# Patient Record
Sex: Female | Born: 1955 | Race: White | Hispanic: No | Marital: Single | Smoking: Former smoker
Health system: Southern US, Community
[De-identification: ages and names within clinical notes are randomized; demographics above are authoritative.]

## PROBLEM LIST (undated history)

## (undated) DIAGNOSIS — I499 Cardiac arrhythmia, unspecified: Secondary | ICD-10-CM

## (undated) DIAGNOSIS — I219 Acute myocardial infarction, unspecified: Secondary | ICD-10-CM

## (undated) DIAGNOSIS — I10 Essential (primary) hypertension: Secondary | ICD-10-CM

## (undated) DIAGNOSIS — J45909 Unspecified asthma, uncomplicated: Secondary | ICD-10-CM

## (undated) DIAGNOSIS — M1990 Unspecified osteoarthritis, unspecified site: Secondary | ICD-10-CM

## (undated) DIAGNOSIS — L0390 Cellulitis, unspecified: Secondary | ICD-10-CM

## (undated) DIAGNOSIS — F419 Anxiety disorder, unspecified: Secondary | ICD-10-CM

## (undated) DIAGNOSIS — I4891 Unspecified atrial fibrillation: Secondary | ICD-10-CM

## (undated) DIAGNOSIS — E039 Hypothyroidism, unspecified: Secondary | ICD-10-CM

## (undated) HISTORY — PX: UMBILICAL HERNIA REPAIR: SHX196

## (undated) HISTORY — DX: Hypothyroidism, unspecified: E03.9

## (undated) HISTORY — PX: CHOLECYSTECTOMY: SHX55

## (undated) HISTORY — DX: Unspecified atrial fibrillation: I48.91

## (undated) HISTORY — DX: Unspecified asthma, uncomplicated: J45.909

---

## 2017-04-22 DIAGNOSIS — I160 Hypertensive urgency: Secondary | ICD-10-CM

## 2017-04-22 DIAGNOSIS — L03116 Cellulitis of left lower limb: Secondary | ICD-10-CM

## 2017-04-22 DIAGNOSIS — I4891 Unspecified atrial fibrillation: Secondary | ICD-10-CM

## 2017-04-22 DIAGNOSIS — E119 Type 2 diabetes mellitus without complications: Secondary | ICD-10-CM

## 2017-04-23 DIAGNOSIS — I4891 Unspecified atrial fibrillation: Secondary | ICD-10-CM

## 2017-11-21 DIAGNOSIS — L03116 Cellulitis of left lower limb: Secondary | ICD-10-CM

## 2017-12-21 ENCOUNTER — Other Ambulatory Visit: Payer: Self-pay

## 2017-12-21 DIAGNOSIS — E039 Hypothyroidism, unspecified: Secondary | ICD-10-CM | POA: Insufficient documentation

## 2017-12-27 ENCOUNTER — Ambulatory Visit (INDEPENDENT_AMBULATORY_CARE_PROVIDER_SITE_OTHER): Payer: Medicaid Other | Admitting: Cardiology

## 2017-12-27 ENCOUNTER — Encounter: Payer: Self-pay | Admitting: Cardiology

## 2017-12-27 VITALS — BP 132/80 | HR 125 | Ht 66.0 in | Wt 315.0 lb

## 2017-12-27 DIAGNOSIS — I4892 Unspecified atrial flutter: Secondary | ICD-10-CM | POA: Insufficient documentation

## 2017-12-27 DIAGNOSIS — E039 Hypothyroidism, unspecified: Secondary | ICD-10-CM | POA: Diagnosis not present

## 2017-12-27 MED ORDER — METOPROLOL SUCCINATE ER 25 MG PO TB24: 25.0000 mg | ORAL_TABLET | Freq: Every day | ORAL | 2 refills | Status: DC

## 2017-12-27 MED ORDER — METOPROLOL SUCCINATE ER 25 MG PO TB24: 25.0000 mg | ORAL_TABLET | Freq: Two times a day (BID) | ORAL | 2 refills | Status: DC

## 2017-12-27 MED ORDER — DIGOXIN 125 MCG PO TABS: 0.1250 mg | ORAL_TABLET | Freq: Every day | ORAL | 0 refills | Status: DC

## 2017-12-27 NOTE — Progress Notes (Deleted)
Cardiology Office Note:    Date:  12/27/2017   ID:  Carrie Walters, DOB 03-05-1956, MRN [id number]  PCP:  [name], MD  Cardiologist:  Garwin Brothers, MD   Referring MD: [name], MD    ASSESSMENT:    1. Atrial fibrillation, unspecified type (HCC)   2. Hypothyroidism, unspecified type   3. Atrial flutter, unspecified type (HCC)    PLAN:    In order of problems listed above:  1. ***   Medication Adjustments/Labs and Tests Ordered: Current medicines are reviewed at length with the patient today.  Concerns regarding medicines are outlined above.  Orders Placed This Encounter  Procedures  . TSH  . CBC with Differential/Platelet  . Basic metabolic panel  . Hepatic function panel  . EKG 12-Lead   Meds ordered this encounter  Medications  . DISCONTD: metoprolol succinate (TOPROL-XL) 25 MG 24 hr tablet    Sig: Take 1 tablet (25 mg total) by mouth daily.    Dispense:  90 tablet    Refill:  2  . DISCONTD: metoprolol succinate (TOPROL-XL) 25 MG 24 hr tablet    Sig: Take 1 tablet (25 mg total) by mouth 2 (two) times daily.    Dispense:  180 tablet    Refill:  2  . digoxin (LANOXIN) 0.125 MG tablet    Sig: Take 1 tablet (0.125 mg total) by mouth daily.    Dispense:  30 tablet    Refill:  0     History of Present Illness:    Carrie Walters is a 62 y.o. female who is being seen today for the evaluation of *** at the request of [name], MD. ***  Past Medical History:  Diagnosis Date  . Asthma   . Atrial fibrillation (HCC)   . Hypothyroidism     Past Surgical History:  Procedure Laterality Date  . CHOLECYSTECTOMY    . UMBILICAL HERNIA REPAIR      Current Medications: Current Meds  Medication Sig  . albuterol (PROVENTIL HFA;VENTOLIN HFA) 108 (90 Base) MCG/ACT inhaler Inhale 2 puffs into the lungs every 6 (six) hours as needed for wheezing or shortness of breath.  . ciprofloxacin (CIPRO) 500 MG tablet Take 1 tablet by mouth 2 (two) times daily.  .  furosemide (LASIX) 40 MG tablet Take 40 mg by mouth daily.  . metolazone (ZAROXOLYN) 2.5 MG tablet Take 2.5 mg by mouth daily.  . verapamil (CALAN-SR) 180 MG CR tablet Take 180 mg by mouth 2 (two) times daily.     Allergies:   Patient has no known allergies.   Social History   Socioeconomic History  . Marital status: Single    Spouse name: Not on file  . Number of children: Not on file  . Years of education: Not on file  . Highest education level: Not on file  Occupational History  . Not on file  Social Needs  . Financial resource strain: Not on file  . Food insecurity:    Worry: Not on file    Inability: Not on file  . Transportation needs:    Medical: Not on file    Non-medical: Not on file  Tobacco Use  . Smoking status: Former Games developer  . Smokeless tobacco: Never Used  Substance and Sexual Activity  . Alcohol use: Not on file  . Drug use: Not on file  . Sexual activity: Not on file  Lifestyle  . Physical activity:    Days per week: Not on file  Minutes per session: Not on file  . Stress: Not on file  Relationships  . Social connections:    Talks on phone: Not on file    Gets together: Not on file    Attends religious service: Not on file    Active member of club or organization: Not on file    Attends meetings of clubs or organizations: Not on file    Relationship status: Not on file  Other Topics Concern  . Not on file  Social History Narrative   Moved from [place]     Family History: The patient's family history includes Atrial fibrillation in her brother; Heart disease in her father.  ROS:   Please see the history of present illness.    All other systems reviewed and are negative.  EKGs/Labs/Other Studies Reviewed:    The following studies were reviewed today: ***   Recent Labs: No results found for requested labs within last 8760 hours.  Recent Lipid Panel No results found for: CHOL, TRIG, HDL, CHOLHDL, VLDL, LDLCALC, LDLDIRECT  Physical  Exam:    VS:  BP 132/80 (BP Location: Right Arm, Patient Position: Sitting, Cuff Size: Normal)   Pulse (!) 125   Ht 5\' 6"  (1.676 m)   Wt (!) 315 lb (142.9 kg)   SpO2 95%   BMI 50.84 kg/m     Wt Readings from Last 3 Encounters:  12/27/17 (!) 315 lb (142.9 kg)  12/21/17 (!) 312 lb (141.5 kg)     GEN: Patient is in no acute distress HEENT: Normal NECK: No JVD; No carotid bruits LYMPHATICS: No lymphadenopathy CARDIAC: S1 S2 regular, 2/6 systolic murmur at the apex. RESPIRATORY:  Clear to auscultation without rales, wheezing or rhonchi  ABDOMEN: Soft, non-tender, non-distended MUSCULOSKELETAL:  No edema; No deformity  SKIN: Warm and dry NEUROLOGIC:  Alert and oriented x 3 PSYCHIATRIC:  Normal affect    Signed, Garwin Brothers, MD  12/27/2017 3:53 PM    [facility]

## 2017-12-27 NOTE — Patient Instructions (Addendum)
Medication Instructions:  Your physician has recommended you make the following change in your medication:  START digoxin 0.125 mg daily  Labwork: Your physician recommends that you have the following labs drawn: TSH, BMP, CBC and liver function panel.  Testing/Procedures: None  Follow-Up: Your physician recommends that you schedule a follow-up appointment in: 1 month  1 week with nurse for EKG, pulse and blood pressure.  Any Other Special Instructions Will Be Listed Below (If Applicable).     If you need a refill on your cardiac medications before your next appointment, please call your pharmacy.   [facility]  Garey Ham, RN, BSN

## 2017-12-27 NOTE — Progress Notes (Signed)
Cardiology Office Note:    Date:  12/27/2017   ID:  Carrie Walters, DOB [birth year], MRN [id number]  PCP:  [name], MD  Cardiologist:  Garwin Brothers, MD   Referring MD: [name], MD    ASSESSMENT:    1. Atrial flutter, unspecified type (HCC)   2. Hypothyroidism, unspecified type   3. Morbid obesity (HCC)    PLAN:    In order of problems listed above:  1. I discussed my findings with the patient at extensive length.  I am going to get blood work done on her today she tells me that she has not had blood work done in the past 1 year.  I reviewed [place] hospital records and her TSH was 10 her hemoglobin was fine she had microcytic anemia.  I also noted that her Chem-7 was unremarkable.  I will initiate her on digoxin 125 mcg daily.  I will do blood work which is Chem-7, CBC, TSH and liver function test.  I will adjust her digoxin dose accordingly.  She will be back on Monday for a pulse blood pressure and an EKG.  Also upon review of her left work I would initiate her on anticoagulation because of elevated chads VASC risk score.  We will discuss these issues with her and her follow-ups.  I discussed anticoagulation and she vocalized understanding.  She is here to make up her mind on this. 2. Diet was discussed for obesity and this of obesity explained she vocalized understanding.  At this time she appears to be non-compliant with medical advice.  She has significant wounds on her lower extremities from obesity and cellulitis-like issues this is followed by her primary care physician. 3. 1 month follow-up or earlier if she has any concerns.   Medication Adjustments/Labs and Tests Ordered: Current medicines are reviewed at length with the patient today.  Concerns regarding medicines are outlined above.  Orders Placed This Encounter  Procedures  . TSH  . CBC with Differential/Platelet  . Basic metabolic panel  . Hepatic function panel  . EKG 12-Lead   Meds ordered this  encounter  Medications  . DISCONTD: metoprolol succinate (TOPROL-XL) 25 MG 24 hr tablet    Sig: Take 1 tablet (25 mg total) by mouth daily.    Dispense:  90 tablet    Refill:  2  . DISCONTD: metoprolol succinate (TOPROL-XL) 25 MG 24 hr tablet    Sig: Take 1 tablet (25 mg total) by mouth 2 (two) times daily.    Dispense:  180 tablet    Refill:  2  . digoxin (LANOXIN) 0.125 MG tablet    Sig: Take 1 tablet (0.125 mg total) by mouth daily.    Dispense:  30 tablet    Refill:  0     History of Present Illness:    Carrie Walters is a 62 y.o. female who is being seen today for the evaluation of atrial flutter with rapid ventricular rate at the request of [name], MD.  Patient is a 62 year old female.  This is the first time I am seeing her.  She has history of arrhythmia.  She tells me that she was in [place] and she has moved down here and plans to go back again.  She denies any problems.  I saw her with a heart rate of about 125" looks like atrial flutter.  She is asymptomatic according to the patient.  She tells me that she has had this problem many times in  the past.  I mentioned to her that with his heart rate I would prefer her to go to the emergency room but she is vehemently against it.  Again she claims to be asymptomatic.  At the time of my evaluation, the patient is alert awake oriented and in no distress.  She has a history of essential hypertension.  Past Medical History:  Diagnosis Date  . Asthma   . Atrial fibrillation (HCC)   . Hypothyroidism     Past Surgical History:  Procedure Laterality Date  . CHOLECYSTECTOMY    . UMBILICAL HERNIA REPAIR      Current Medications: Current Meds  Medication Sig  . albuterol (PROVENTIL HFA;VENTOLIN HFA) 108 (90 Base) MCG/ACT inhaler Inhale 2 puffs into the lungs every 6 (six) hours as needed for wheezing or shortness of breath.  . ciprofloxacin (CIPRO) 500 MG tablet Take 1 tablet by mouth 2 (two) times daily.  . furosemide  (LASIX) 40 MG tablet Take 40 mg by mouth daily.  . metolazone (ZAROXOLYN) 2.5 MG tablet Take 2.5 mg by mouth daily.  . verapamil (CALAN-SR) 180 MG CR tablet Take 180 mg by mouth 2 (two) times daily.     Allergies:   Patient has no known allergies.   Social History   Socioeconomic History  . Marital status: Single    Spouse name: Not on file  . Number of children: Not on file  . Years of education: Not on file  . Highest education level: Not on file  Occupational History  . Not on file  Social Needs  . Financial resource strain: Not on file  . Food insecurity:    Worry: Not on file    Inability: Not on file  . Transportation needs:    Medical: Not on file    Non-medical: Not on file  Tobacco Use  . Smoking status: Former Games developer  . Smokeless tobacco: Never Used  Substance and Sexual Activity  . Alcohol use: Not on file  . Drug use: Not on file  . Sexual activity: Not on file  Lifestyle  . Physical activity:    Days per week: Not on file    Minutes per session: Not on file  . Stress: Not on file  Relationships  . Social connections:    Talks on phone: Not on file    Gets together: Not on file    Attends religious service: Not on file    Active member of club or organization: Not on file    Attends meetings of clubs or organizations: Not on file    Relationship status: Not on file  Other Topics Concern  . Not on file  Social History Narrative   Moved from [place]     Family History: The patient's family history includes Atrial fibrillation in her brother; Heart disease in her father.  ROS:   Please see the history of present illness.    All other systems reviewed and are negative.  EKGs/Labs/Other Studies Reviewed:    The following studies were reviewed today: Atrial flutter with rapid ventricular rate on resting EKG.   Recent Labs: No results found for requested labs within last 8760 hours.  Recent Lipid Panel No results found for: CHOL, TRIG, HDL,  CHOLHDL, VLDL, LDLCALC, LDLDIRECT  Physical Exam:    VS:  BP 132/80 (BP Location: Right Arm, Patient Position: Sitting, Cuff Size: Normal)   Pulse (!) 125   Ht 5\' 6"  (1.676 m)   Wt (!) 315 lb (  142.9 kg)   SpO2 95%   BMI 50.84 kg/m     Wt Readings from Last 3 Encounters:  12/27/17 (!) 315 lb (142.9 kg)  12/21/17 (!) 312 lb (141.5 kg)     GEN: Patient is in no acute distress HEENT: Normal NECK: No JVD; No carotid bruits LYMPHATICS: No lymphadenopathy CARDIAC: S1 S2 irregular, 2/6 systolic murmur at the apex. RESPIRATORY:  Clear to auscultation without rales, wheezing or rhonchi  ABDOMEN: Soft, non-tender, non-distended MUSCULOSKELETAL:  No edema; No deformity  SKIN: Warm and dry NEUROLOGIC:  Alert and oriented x 3 PSYCHIATRIC:  Normal affect    Signed, Garwin Brothers, MD  12/27/2017 3:58 PM    [facility]

## 2017-12-28 ENCOUNTER — Other Ambulatory Visit: Payer: Self-pay

## 2017-12-28 ENCOUNTER — Telehealth: Payer: Self-pay

## 2017-12-28 ENCOUNTER — Telehealth: Payer: Self-pay | Admitting: Cardiology

## 2017-12-28 LAB — BASIC METABOLIC PANEL
BUN / CREAT RATIO: 9 — AB (ref 12–28)
BUN: 10 mg/dL (ref 8–27)
CHLORIDE: 102 mmol/L (ref 96–106)
CO2: 26 mmol/L (ref 20–29)
Calcium: 9 mg/dL (ref 8.7–10.3)
Creatinine, Ser: 1.08 mg/dL — ABNORMAL HIGH (ref 0.57–1.00)
GFR calc non Af Amer: 55 mL/min/{1.73_m2} — ABNORMAL LOW (ref >59)
GFR, EST AFRICAN AMERICAN: 64 mL/min/{1.73_m2} (ref >59)
GLUCOSE: 100 mg/dL — AB (ref 65–99)
Potassium: 4.4 mmol/L (ref 3.5–5.2)
Sodium: 142 mmol/L (ref 134–144)

## 2017-12-28 LAB — CBC WITH DIFFERENTIAL/PLATELET
BASOS ABS: 0 10*3/uL (ref 0.0–0.2)
Basos: 0 %
EOS (ABSOLUTE): 0.2 10*3/uL (ref 0.0–0.4)
Eos: 2 %
Hematocrit: 37.9 % (ref 34.0–46.6)
Hemoglobin: 12.2 g/dL (ref 11.1–15.9)
Immature Grans (Abs): 0 10*3/uL (ref 0.0–0.1)
Immature Granulocytes: 0 %
LYMPHS ABS: 1.8 10*3/uL (ref 0.7–3.1)
Lymphs: 22 %
MCH: 25.8 pg — AB (ref 26.6–33.0)
MCHC: 32.2 g/dL (ref 31.5–35.7)
MCV: 80 fL (ref 79–97)
MONOS ABS: 0.6 10*3/uL (ref 0.1–0.9)
Monocytes: 7 %
NEUTROS ABS: 5.5 10*3/uL (ref 1.4–7.0)
Neutrophils: 69 %
Platelets: 236 10*3/uL (ref 150–450)
RBC: 4.72 x10E6/uL (ref 3.77–5.28)
RDW: 21.3 % — AB (ref 12.3–15.4)
WBC: 8.1 10*3/uL (ref 3.4–10.8)

## 2017-12-28 LAB — HEPATIC FUNCTION PANEL
ALT: 6 IU/L (ref 0–32)
AST: 9 IU/L (ref 0–40)
Albumin: 3.7 g/dL (ref 3.6–4.8)
Alkaline Phosphatase: 96 IU/L (ref 39–117)
BILIRUBIN, DIRECT: 0.54 mg/dL — AB (ref 0.00–0.40)
Bilirubin Total: 1.7 mg/dL — ABNORMAL HIGH (ref 0.0–1.2)
TOTAL PROTEIN: 6.5 g/dL (ref 6.0–8.5)

## 2017-12-28 LAB — TSH: TSH: 7.52 u[IU]/mL — AB (ref 0.450–4.500)

## 2017-12-28 MED ORDER — DIGOXIN 125 MCG PO TABS: 0.1250 mg | ORAL_TABLET | Freq: Every day | ORAL | 0 refills | Status: DC

## 2017-12-28 MED ORDER — ASPIRIN EC 325 MG PO TBEC: 325.0000 mg | DELAYED_RELEASE_TABLET | Freq: Every day | ORAL | 0 refills | Status: DC

## 2017-12-28 NOTE — Telephone Encounter (Signed)
The Digoxin that was called in yesterday was supposed to be called to [facility] in [place], not [facility]

## 2017-12-28 NOTE — Telephone Encounter (Signed)
Med refill was sent to pharmacy in [place].

## 2017-12-28 NOTE — Telephone Encounter (Signed)
Unable to leave voicemail regarding labs due to busy tone, will try again later.

## 2018-01-03 ENCOUNTER — Ambulatory Visit: Payer: Medicaid Other

## 2018-01-07 ENCOUNTER — Ambulatory Visit: Payer: Medicaid Other

## 2018-01-10 ENCOUNTER — Ambulatory Visit: Payer: Medicaid Other

## 2018-01-14 ENCOUNTER — Ambulatory Visit: Payer: Medicaid Other

## 2018-01-14 ENCOUNTER — Ambulatory Visit (INDEPENDENT_AMBULATORY_CARE_PROVIDER_SITE_OTHER): Payer: Medicaid Other | Admitting: Cardiology

## 2018-01-14 VITALS — BP 140/92 | HR 127 | Ht 66.0 in | Wt 293.0 lb

## 2018-01-14 DIAGNOSIS — I4892 Unspecified atrial flutter: Secondary | ICD-10-CM | POA: Diagnosis not present

## 2018-01-14 MED ORDER — METOPROLOL SUCCINATE ER 25 MG PO TB24: 25.0000 mg | ORAL_TABLET | Freq: Every day | ORAL | 1 refills | Status: DC

## 2018-01-14 MED ORDER — NEBIVOLOL HCL 5 MG PO TABS: 5.0000 mg | ORAL_TABLET | Freq: Every day | ORAL | 2 refills | Status: DC

## 2018-01-14 NOTE — Patient Instructions (Addendum)
Medication Instructions:  Your physician has recommended you make the following change in your medication: Bystolic 5 mg daily  Labwork: None  Testing/Procedures: None  Follow-Up: Your physician recommends that you schedule a follow-up appointment in: two days   Any Other Special Instructions Will Be Listed Below (If Applicable).     If you need a refill on your cardiac medications before your next appointment, please call your pharmacy. Nebivolol oral tablets What is this medicine? NEBIVOLOL (ne BIV oh lol) is a beta-blocker. Beta-blockers reduce the workload on the heart and help it to beat more regularly. This medicine is used to treat high blood pressure. This medicine may be used for other purposes; ask your health care provider or pharmacist if you have questions. COMMON BRAND NAME(S): Bystolic What should I tell my health care provider before I take this medicine? They need to know if you have any of these conditions: -diabetes -heart or vessel disease like slow heartrate, worsening heart failure, heart block, sick sinus syndrome or Raynaud's disease -kidney disease -liver disease -lung disease like asthma or emphysema -pheochromocytoma -thyroid disease -an unusual or allergic reaction to nebivolol, other beta-blockers, medicines, foods, dyes, or preservatives -pregnant or trying to get pregnant -breast-feeding How should I use this medicine? Take this medicine by mouth with a glass of water. Follow the directions on the prescription label. You can take this medicine with or without food. Take your doses at regular intervals. Do not take your medicine more often than directed. Do not stop taking this medicine suddenly. This could lead to serious heart-related effects. Talk to your pediatrician regarding the use of this medicine in children. Special care may be needed. Overdosage: If you think you have taken too much of this medicine contact a poison control center or  emergency room at once. NOTE: This medicine is only for you. Do not share this medicine with others. What if I miss a dose? If you miss a dose, take it as soon as you can. If it is almost time for your next dose, take only that dose. Do not take double or extra doses. What may interact with this medicine? This medicine may interact with the following medications: -certain medicines for blood pressure, heart disease, irregular heart beat -certain medicines for depression, like fluoxetine or paroxetine -cimetidine -clonidine -reserpine -sildenafil This list may not describe all possible interactions. Give your health care provider a list of all the medicines, herbs, non-prescription drugs, or dietary supplements you use. Also tell them if you smoke, drink alcohol, or use illegal drugs. Some items may interact with your medicine. What should I watch for while using this medicine? Visit your doctor or health care professional for regular checks on your progress. Check your heart rate and blood pressure regularly while you are taking this medicine. Ask your doctor or health care professional what your heart rate and blood pressure should be, and when you should contact him or her. You may get drowsy or dizzy. Do not drive, use machinery, or do anything that needs mental alertness until you know how this drug affects you. Do not stand or sit up quickly, especially if you are an older patient. This reduces the risk of dizzy or fainting spells. Alcohol can make you more drowsy and dizzy. Avoid alcoholic drinks. This medicine can affect blood sugar levels. If you have diabetes, check with your doctor or health care professional before you change your diet or the dose of your diabetic medicine. Do not treat yourself  for coughs, colds, or pain while you are taking this medicine without asking your doctor or health care professional for advice. Some ingredients may increase your blood pressure. What side  effects may I notice from receiving this medicine? Side effects that you should report to your doctor or health care professional as soon as possible: -allergic reactions like skin rash, itching or hives, swelling of the face, lips, or tongue -breathing problems -chest pain -cold, tingling, or numb hands or feet -dark urine -general ill feeling or flu-like symptoms -irregular heartbeat -light-colored stools -loss of appetite, nausea -right upper belly pain -slow heart rate -swollen legs or ankles -unusual bleeding or bruising -unusually weak or tired -vomiting -yellowing of the eyes or skin Side effects that usually do not require medical attention (report to your doctor or health care professional if they continue or are bothersome): -diarrhea -dizziness -dry or burning eyes -headache -nausea -tiredness -trouble sleeping This list may not describe all possible side effects. Call your doctor for medical advice about side effects. You may report side effects to FDA at 1-800-FDA-1088. Where should I keep my medicine? Keep out of the reach of children. Store at room temperature between 20 and 25 degrees C (68 and 77 degrees F). Protect from light. Keep container tightly closed. Throw away any unused medicine after the expiration date. NOTE: This sheet is a summary. It may not cover all possible information. If you have questions about this medicine, talk to your doctor, pharmacist, or health care provider.  2018 Elsevier/Gold Standard (2013-01-10 14:46:00)

## 2018-01-15 ENCOUNTER — Encounter: Payer: Self-pay | Admitting: Cardiology

## 2018-01-15 NOTE — Progress Notes (Signed)
Patient presented today for EKG, pulse and BP check due to digoxin therapy. Per Dr. Tomie China wanted to start on metoprolol but patient states took before and couldn't tolerate, so per Dr. Tomie China would like to start patient on bystolic 5 mg and would like to see her back Wednesday for another nurse visit.

## 2018-01-16 ENCOUNTER — Ambulatory Visit: Payer: Medicaid Other

## 2018-01-18 ENCOUNTER — Ambulatory Visit (INDEPENDENT_AMBULATORY_CARE_PROVIDER_SITE_OTHER): Payer: Medicaid Other | Admitting: Cardiology

## 2018-01-18 VITALS — BP 145/82 | HR 122

## 2018-01-18 DIAGNOSIS — I4892 Unspecified atrial flutter: Secondary | ICD-10-CM | POA: Diagnosis not present

## 2018-01-18 MED ORDER — NEBIVOLOL HCL 10 MG PO TABS: 10.0000 mg | ORAL_TABLET | Freq: Every day | ORAL | 2 refills | Status: DC

## 2018-01-18 NOTE — Progress Notes (Signed)
Patient presented today for EKG, Blood pressure and pulse check due to  bystolic therapy and through the visit we advised patient to go to the ER because of uncontrolled heart rate and BP and patient refused so per Dr. Tomie China would like to increase bystolic and see patient back for a nurse visit on Tuesday.

## 2018-01-18 NOTE — Patient Instructions (Signed)
Medication Instructions:  Your physician has recommended you make the following change in your medication:   CHANGE: Bystolic 5 mg to Bystolic 10 mg daily.   Labwork: None  Testing/Procedures: None  Follow-Up:  Your physician recommends that you schedule a follow-up appointment in: Tuesday  .  Any Other Special Instructions Will Be Listed Below (If Applicable).     If you need a refill on your cardiac medications before your next appointment, please call your pharmacy.

## 2018-01-22 ENCOUNTER — Ambulatory Visit: Payer: Medicaid Other

## 2018-01-23 NOTE — Addendum Note (Signed)
Addended by: Lita Mains on: 01/23/2018 09:06 AM   Modules accepted: Orders

## 2018-01-23 NOTE — Addendum Note (Signed)
Addended by: Lita Mains on: 01/23/2018 02:12 PM   Modules accepted: Orders

## 2018-01-23 NOTE — Addendum Note (Signed)
Addended by: Lamona Curl on: 01/23/2018 03:23 PM   Modules accepted: Orders

## 2018-01-28 ENCOUNTER — Ambulatory Visit: Payer: Medicaid Other | Admitting: Cardiology

## 2018-02-06 ENCOUNTER — Ambulatory Visit: Payer: Medicaid Other | Admitting: Cardiology

## 2018-02-07 ENCOUNTER — Telehealth: Payer: Self-pay | Admitting: Cardiology

## 2018-02-07 NOTE — Telephone Encounter (Signed)
Please call about no insurance for biostolic

## 2018-02-08 NOTE — Telephone Encounter (Signed)
24 hour review process with  [facility], med has been sent to the pharmacist for review as it is not a preferred medication. Prior Berkley Harvey number is- [phone]. Phone interaction ID number is- [id number]

## 2018-02-14 ENCOUNTER — Ambulatory Visit: Payer: Medicaid Other | Admitting: Cardiology

## 2018-02-21 ENCOUNTER — Inpatient Hospital Stay (HOSPITAL_COMMUNITY)
Admission: AD | Admit: 2018-02-21 | Discharge: 2018-03-01 | DRG: 286 | Disposition: A | Discharge status: Home Health | Payer: Medicaid Other | Source: Other Acute Inpatient Hospital | Attending: Cardiology | Admitting: Cardiology

## 2018-02-21 ENCOUNTER — Encounter (HOSPITAL_COMMUNITY): Payer: Self-pay

## 2018-02-21 DIAGNOSIS — B958 Unspecified staphylococcus as the cause of diseases classified elsewhere: Secondary | ICD-10-CM | POA: Diagnosis not present

## 2018-02-21 DIAGNOSIS — J449 Chronic obstructive pulmonary disease, unspecified: Secondary | ICD-10-CM | POA: Diagnosis not present

## 2018-02-21 DIAGNOSIS — I472 Ventricular tachycardia: Secondary | ICD-10-CM | POA: Diagnosis present

## 2018-02-21 DIAGNOSIS — Z87891 Personal history of nicotine dependence: Secondary | ICD-10-CM

## 2018-02-21 DIAGNOSIS — L8990 Pressure ulcer of unspecified site, unspecified stage: Secondary | ICD-10-CM

## 2018-02-21 DIAGNOSIS — I428 Other cardiomyopathies: Secondary | ICD-10-CM | POA: Diagnosis not present

## 2018-02-21 DIAGNOSIS — I10 Essential (primary) hypertension: Secondary | ICD-10-CM | POA: Diagnosis not present

## 2018-02-21 DIAGNOSIS — J9601 Acute respiratory failure with hypoxia: Secondary | ICD-10-CM | POA: Diagnosis present

## 2018-02-21 DIAGNOSIS — E039 Hypothyroidism, unspecified: Secondary | ICD-10-CM | POA: Diagnosis not present

## 2018-02-21 DIAGNOSIS — Z8249 Family history of ischemic heart disease and other diseases of the circulatory system: Secondary | ICD-10-CM

## 2018-02-21 DIAGNOSIS — I5021 Acute systolic (congestive) heart failure: Secondary | ICD-10-CM | POA: Diagnosis present

## 2018-02-21 DIAGNOSIS — B852 Pediculosis, unspecified: Secondary | ICD-10-CM | POA: Diagnosis present

## 2018-02-21 DIAGNOSIS — I878 Other specified disorders of veins: Secondary | ICD-10-CM | POA: Diagnosis present

## 2018-02-21 DIAGNOSIS — I4891 Unspecified atrial fibrillation: Secondary | ICD-10-CM | POA: Diagnosis not present

## 2018-02-21 DIAGNOSIS — Z7982 Long term (current) use of aspirin: Secondary | ICD-10-CM | POA: Diagnosis not present

## 2018-02-21 DIAGNOSIS — I4811 Longstanding persistent atrial fibrillation: Secondary | ICD-10-CM | POA: Diagnosis not present

## 2018-02-21 DIAGNOSIS — E876 Hypokalemia: Secondary | ICD-10-CM | POA: Diagnosis not present

## 2018-02-21 DIAGNOSIS — I4892 Unspecified atrial flutter: Secondary | ICD-10-CM | POA: Diagnosis not present

## 2018-02-21 DIAGNOSIS — Z8674 Personal history of sudden cardiac arrest: Secondary | ICD-10-CM

## 2018-02-21 DIAGNOSIS — I110 Hypertensive heart disease with heart failure: Secondary | ICD-10-CM | POA: Diagnosis not present

## 2018-02-21 DIAGNOSIS — Z6841 Body Mass Index (BMI) 40.0 and over, adult: Secondary | ICD-10-CM | POA: Diagnosis not present

## 2018-02-21 DIAGNOSIS — I480 Paroxysmal atrial fibrillation: Secondary | ICD-10-CM | POA: Diagnosis present

## 2018-02-21 DIAGNOSIS — I469 Cardiac arrest, cause unspecified: Secondary | ICD-10-CM

## 2018-02-21 HISTORY — DX: Cardiac arrhythmia, unspecified: I49.9

## 2018-02-21 HISTORY — DX: Anxiety disorder, unspecified: F41.9

## 2018-02-21 HISTORY — DX: Essential (primary) hypertension: I10

## 2018-02-21 HISTORY — DX: Unspecified osteoarthritis, unspecified site: M19.90

## 2018-02-21 MED ORDER — LEVOTHYROXINE SODIUM 25 MCG PO TABS
25.0000 ug | ORAL_TABLET | Freq: Every day | ORAL | Status: DC
  Administered 2018-02-22 – 2018-03-01 (×8): 25 ug via ORAL
  Filled 2018-02-21 (×8): qty 1

## 2018-02-21 MED ORDER — VERAPAMIL HCL ER 180 MG PO TBCR
180.0000 mg | EXTENDED_RELEASE_TABLET | Freq: Two times a day (BID) | ORAL | Status: DC
  Administered 2018-02-22: 180 mg via ORAL
  Filled 2018-02-21 (×2): qty 1

## 2018-02-21 MED ORDER — NYSTATIN 100000 UNIT/GM EX POWD
Freq: Three times a day (TID) | CUTANEOUS | Status: AC
  Administered 2018-02-21 – 2018-02-24 (×7): via TOPICAL
  Filled 2018-02-21: qty 15

## 2018-02-21 MED ORDER — AMIODARONE HCL IN DEXTROSE 360-4.14 MG/200ML-% IV SOLN
30.0000 mg/h | INTRAVENOUS | Status: DC
  Administered 2018-02-22 – 2018-02-25 (×7): 30 mg/h via INTRAVENOUS
  Filled 2018-02-21 (×7): qty 200

## 2018-02-21 MED ORDER — ALBUTEROL SULFATE (2.5 MG/3ML) 0.083% IN NEBU
2.5000 mg | INHALATION_SOLUTION | Freq: Four times a day (QID) | RESPIRATORY_TRACT | Status: DC | PRN
  Administered 2018-02-22 – 2018-02-24 (×4): 2.5 mg via RESPIRATORY_TRACT
  Filled 2018-02-21 (×4): qty 3

## 2018-02-21 MED ORDER — HEPARIN (PORCINE) IN NACL 100-0.45 UNIT/ML-% IJ SOLN
2500.0000 [IU]/h | INTRAMUSCULAR | Status: DC
  Administered 2018-02-21: 1300 [IU]/h via INTRAVENOUS
  Administered 2018-02-22: 1700 [IU]/h via INTRAVENOUS
  Administered 2018-02-23: 1900 [IU]/h via INTRAVENOUS
  Administered 2018-02-23 (×2): 1950 [IU]/h via INTRAVENOUS
  Administered 2018-02-24: 2450 [IU]/h via INTRAVENOUS
  Administered 2018-02-24: 2250 [IU]/h via INTRAVENOUS
  Administered 2018-02-25: 2450 [IU]/h via INTRAVENOUS
  Administered 2018-02-25: 2500 [IU]/h via INTRAVENOUS
  Filled 2018-02-21 (×8): qty 250

## 2018-02-21 MED ORDER — ONDANSETRON HCL 4 MG/2ML IJ SOLN: 4.0000 mg | Freq: Four times a day (QID) | INTRAMUSCULAR | Status: DC | PRN

## 2018-02-21 MED ORDER — METOLAZONE 2.5 MG PO TABS
2.5000 mg | ORAL_TABLET | Freq: Every day | ORAL | Status: DC
  Administered 2018-02-22: 2.5 mg via ORAL
  Filled 2018-02-21: qty 1

## 2018-02-21 MED ORDER — ACETAMINOPHEN 325 MG PO TABS
650.0000 mg | ORAL_TABLET | ORAL | Status: DC | PRN
  Administered 2018-02-22 – 2018-02-28 (×9): 650 mg via ORAL
  Filled 2018-02-21 (×11): qty 2

## 2018-02-21 MED ORDER — HEPARIN BOLUS VIA INFUSION
4000.0000 [IU] | Freq: Once | INTRAVENOUS | Status: AC
  Administered 2018-02-21: 4000 [IU] via INTRAVENOUS
  Filled 2018-02-21: qty 4000

## 2018-02-21 MED ORDER — FUROSEMIDE 40 MG PO TABS
40.0000 mg | ORAL_TABLET | Freq: Every day | ORAL | Status: DC
  Administered 2018-02-22: 40 mg via ORAL
  Filled 2018-02-21: qty 1

## 2018-02-21 MED ORDER — ALBUTEROL SULFATE HFA 108 (90 BASE) MCG/ACT IN AERS: 2.0000 | INHALATION_SPRAY | Freq: Four times a day (QID) | RESPIRATORY_TRACT | Status: DC | PRN

## 2018-02-21 NOTE — Progress Notes (Signed)
ANTICOAGULATION CONSULT NOTE - Initial Consult  Pharmacy Consult for heparin Indication: atrial fibrillation  Allergies  Allergen Reactions  . Metoprolol Tartrate Shortness Of Breath  . Biaxin [Clarithromycin] Other (See Comments)    Sores in mouth  . Contrast Media [Iodinated Diagnostic Agents] Rash    Patient Measurements: Height: 5\' 6"  (167.6 cm) Weight: 290 lb (131.5 kg)(estimate) IBW/kg (Calculated) : 59.3 Heparin Dosing Weight: 91.4 kg  Vital Signs:    Labs: No results for input(s): HGB, HCT, PLT, APTT, LABPROT, INR, HEPARINUNFRC, HEPRLOWMOCWT, CREATININE, CKTOTAL, CKMB, TROPONINI in the last 72 hours.  CrCl cannot be calculated (Patient's most recent lab result is older than the maximum 21 days allowed.).   Medical History: Past Medical History:  Diagnosis Date  . Anxiety   . Arthritis   . Asthma   . Atrial fibrillation (HCC)   . Dysrhythmia   . Hypertension   . Hypothyroidism     Medications:  See medication rec  Assessment: 62 yo lady to start heparin for afib.  She was not on anticoagulation PTA Goal of Therapy:  Heparin level 0.3-0.7 units/ml Monitor platelets by anticoagulation protocol: Yes   Plan:  Heparin 4000 unit bolus and drip at 1300 units/hr Check heparin level in 6-8 hours Daily heparin level and CBC daily while on heparin Monitor for bleeding complications  Geofrey Silliman Poteet 02/21/2018,11:01 PM

## 2018-02-21 NOTE — H&P (Signed)
History and Physical   Patient ID: Carrie Walters, MRN: [id number], DOB: October 28, 1955   Date of Encounter: 02/21/2018, 10:12 PM  Primary Care Provider: [name], MD Cardiologist: Belva Crome  Chief Complaint:  Cardiac arrest  History of Present Illness: Carrie Walters is a 62 y.o. female w/ h/o morbid obesity, atrial flutter per recent cardiac notes, severe hypothyroidism w/ recent TSH noted to be 10.0 at [facility] (per cardiology note from august 2019, and repeat TSH 7.5 drawn 12-27-17, but pt has not been started on any thyroid supplementation at this time as far as I am aware), COPD, HTN admitted after an apparent cardiac arrest at OSH ED. Notes from [place] indicate pt presented to ED with "heart problems," and while there passed out while in the bathroom. Notes indicate no pulse was present, and so CPR was initiated. Pt received 1mg  IV epi, and pt was found to be in VF on a monitor, and 300J x1 was administered w/ ROSC. Pt was alert and responsive. Notes indicate pt was in afib w/ RVR post-episode. Upon arrival at [facility], pt is in afib w/ RVR, rate low 100s. She is on amiodarone IV gtt.  We have no tele strips from the pulseless episode for review so the exact arrhythmia cannot be confirmed. Pt says she has had afib for the past 4-5 years. She has been on coumadin before, but it was stopped for unclear reasons when she moved to [place] about a year or so ago. She says she has had hypothyroidism for a long time, and was on synthroid up [place]. When she moved to [facility], she was on the synthroid until she ran out and has been off of it for several months now.  Past Medical History:  Diagnosis Date  . Asthma   . Atrial fibrillation (HCC)   . Hypothyroidism     Past Surgical History:  Procedure Laterality Date  . CHOLECYSTECTOMY    . UMBILICAL HERNIA REPAIR       Prior to Admission medications   Medication Sig Start Date End Date Taking? Authorizing Provider  albuterol (PROVENTIL  HFA;VENTOLIN HFA) 108 (90 Base) MCG/ACT inhaler Inhale 2 puffs into the lungs every 6 (six) hours as needed for wheezing or shortness of breath.    [provider]  aspirin EC 325 MG tablet Take 1 tablet (325 mg total) by mouth daily. 12/28/17   Revankar, Aundra Dubin, MD  ciprofloxacin (CIPRO) 500 MG tablet Take 1 tablet by mouth 2 (two) times daily. 12/10/17   [provider]  digoxin (LANOXIN) 0.125 MG tablet Take 1 tablet (0.125 mg total) by mouth daily. 12/28/17   Revankar, Aundra Dubin, MD  furosemide (LASIX) 40 MG tablet Take 40 mg by mouth daily.    [provider]  metolazone (ZAROXOLYN) 2.5 MG tablet Take 2.5 mg by mouth daily.    [provider]  nebivolol (BYSTOLIC) 10 MG tablet Take 1 tablet (10 mg total) by mouth daily. 01/18/18   Revankar, Aundra Dubin, MD  verapamil (CALAN-SR) 180 MG CR tablet Take 180 mg by mouth 2 (two) times daily. 10/18/17   [provider]     Allergies: Allergies  Allergen Reactions  . Metoprolol Tartrate Shortness Of Breath  . Biaxin [Clarithromycin] Other (See Comments)    Sores in mouth  . Contrast Media [Iodinated Diagnostic Agents] Rash    Social History:  The patient  reports that she has quit smoking. She has never used smokeless tobacco.   Family History:  The  patient's family history includes Atrial fibrillation in her brother; Heart disease in her father.   ROS:  Please see the history of present illness.     All other systems reviewed and negative.   Vital Signs: Height 5\' 6"  (1.676 m), weight 131.5 kg.  BP 105/85, HR 107, O2 sat 95% on NRB  PHYSICAL EXAM: General:  Well nourished, well developed, in no acute distress. On NRB HEENT: normal Lymph: no adenopathy Neck: no JVD Endocrine:  No thryomegaly Vascular: No carotid bruits; DP pulses 2+ bilaterally   Cardiac:  normal S1, S2; irreg irreg, tachy; no murmur  Lungs:  clear to auscultation bilaterally, no wheezing, rhonchi or rales  Abd: morbid obesity,  soft, nontender, no hepatomegaly  Ext: BLE are wrapped, warm. 2-3+ bilat edema Musculoskeletal:  No deformities, BUE and BLE strength normal and equal Skin: warm and dry  Neuro:  CNs 2-12 intact, no focal abnormalities noted Psych:  Normal affect   EKG:  afib w/ HR 119  Labs:   Lab Results  Component Value Date   WBC 8.1 12/27/2017   HGB 12.2 12/27/2017   HCT 37.9 12/27/2017   MCV 80 12/27/2017   PLT 236 12/27/2017   No results for input(s): NA, K, CL, CO2, BUN, CREATININE, CALCIUM, PROT, BILITOT, ALKPHOS, ALT, AST, GLUCOSE in the last 168 hours.  Invalid input(s): LABALBU No results for input(s): CKTOTAL, CKMB, TROPONINI in the last 72 hours. Troponin (Point of Care Test) No results for input(s): TROPIPOC in the last 72 hours.  No results found for: CHOL, HDL, LDLCALC, TRIG No results found for: DDIMER   Labs from [place] Cr 0.90, K 4.5, trop 0.04, proBNP 2060 Hgb 15.5, Hct 50.9  Radiology/Studies:  No results found.    ASSESSMENT AND PLAN:   1. Afib: given her thyroid issues, amio is not the best long-term treatment. She has soft BP (last 88/64) so I am hesitant to try dilt gtt for now. She has adverse rxn to BB in the past ("could not breathe" with metoprolol and does not want to ever take it again.) so I think we need to leave the [facility] on for now for rate control tonight. Will add Heparin IV gtt. Can plan for repeat DCCV in the AM. Will get TTE. Will go ahead w/ EP consult given arrest; pt needs her thyroid issues addressed but may also need EPS to make sure there is no inducible VT that would indicate a need for possible ICD. EP can also consider addition of anti-arrhythmic once we have more info re: EF, etc. Will check magnesium along w/ basic labs  2. Hypothyroid: recheck TSH. initiate synthroid daily. Consider endocrine c/s  3. HTN: BP currently very low. Hold all BP meds for now  4. hypoxia: sats in low 90s with NRB. She got at least one dose IV lasix at  [place]. There may be an element of LV dysfunction, esp if she has been tachy for an extended period of time. She also has COPD. She will need ischemia w/u if EF is down  Signed,  Precious Reel, MD  02/21/2018 10:12 PM

## 2018-02-22 ENCOUNTER — Observation Stay (HOSPITAL_BASED_OUTPATIENT_CLINIC_OR_DEPARTMENT_OTHER): Payer: Medicaid Other

## 2018-02-22 DIAGNOSIS — Z8674 Personal history of sudden cardiac arrest: Secondary | ICD-10-CM | POA: Diagnosis not present

## 2018-02-22 DIAGNOSIS — B852 Pediculosis, unspecified: Secondary | ICD-10-CM | POA: Diagnosis present

## 2018-02-22 DIAGNOSIS — I483 Typical atrial flutter: Secondary | ICD-10-CM | POA: Diagnosis not present

## 2018-02-22 DIAGNOSIS — J9601 Acute respiratory failure with hypoxia: Secondary | ICD-10-CM

## 2018-02-22 DIAGNOSIS — I5021 Acute systolic (congestive) heart failure: Secondary | ICD-10-CM | POA: Diagnosis present

## 2018-02-22 DIAGNOSIS — I469 Cardiac arrest, cause unspecified: Secondary | ICD-10-CM | POA: Diagnosis not present

## 2018-02-22 DIAGNOSIS — I110 Hypertensive heart disease with heart failure: Secondary | ICD-10-CM | POA: Diagnosis present

## 2018-02-22 DIAGNOSIS — Z87891 Personal history of nicotine dependence: Secondary | ICD-10-CM | POA: Diagnosis not present

## 2018-02-22 DIAGNOSIS — I878 Other specified disorders of veins: Secondary | ICD-10-CM | POA: Diagnosis present

## 2018-02-22 DIAGNOSIS — Z6841 Body Mass Index (BMI) 40.0 and over, adult: Secondary | ICD-10-CM | POA: Diagnosis not present

## 2018-02-22 DIAGNOSIS — B958 Unspecified staphylococcus as the cause of diseases classified elsewhere: Secondary | ICD-10-CM | POA: Diagnosis present

## 2018-02-22 DIAGNOSIS — I472 Ventricular tachycardia: Secondary | ICD-10-CM | POA: Diagnosis present

## 2018-02-22 DIAGNOSIS — Z8249 Family history of ischemic heart disease and other diseases of the circulatory system: Secondary | ICD-10-CM | POA: Diagnosis not present

## 2018-02-22 DIAGNOSIS — I4891 Unspecified atrial fibrillation: Secondary | ICD-10-CM | POA: Diagnosis not present

## 2018-02-22 DIAGNOSIS — I4811 Longstanding persistent atrial fibrillation: Secondary | ICD-10-CM | POA: Diagnosis present

## 2018-02-22 DIAGNOSIS — J449 Chronic obstructive pulmonary disease, unspecified: Secondary | ICD-10-CM | POA: Diagnosis present

## 2018-02-22 DIAGNOSIS — L8990 Pressure ulcer of unspecified site, unspecified stage: Secondary | ICD-10-CM

## 2018-02-22 DIAGNOSIS — I4892 Unspecified atrial flutter: Secondary | ICD-10-CM | POA: Diagnosis present

## 2018-02-22 DIAGNOSIS — L89159 Pressure ulcer of sacral region, unspecified stage: Secondary | ICD-10-CM | POA: Diagnosis not present

## 2018-02-22 DIAGNOSIS — E876 Hypokalemia: Secondary | ICD-10-CM | POA: Diagnosis not present

## 2018-02-22 DIAGNOSIS — Z7982 Long term (current) use of aspirin: Secondary | ICD-10-CM | POA: Diagnosis not present

## 2018-02-22 DIAGNOSIS — I10 Essential (primary) hypertension: Secondary | ICD-10-CM | POA: Diagnosis not present

## 2018-02-22 DIAGNOSIS — I4819 Other persistent atrial fibrillation: Secondary | ICD-10-CM | POA: Diagnosis not present

## 2018-02-22 DIAGNOSIS — I428 Other cardiomyopathies: Secondary | ICD-10-CM | POA: Diagnosis present

## 2018-02-22 DIAGNOSIS — E039 Hypothyroidism, unspecified: Secondary | ICD-10-CM | POA: Diagnosis present

## 2018-02-22 LAB — ECHOCARDIOGRAM LIMITED
Height: 66 in
WEIGHTICAEL: 4640 [oz_av]

## 2018-02-22 LAB — COMPREHENSIVE METABOLIC PANEL
ALT: 23 U/L (ref 0–44)
AST: 37 U/L (ref 15–41)
Albumin: 3.6 g/dL (ref 3.5–5.0)
Alkaline Phosphatase: 87 U/L (ref 38–126)
Anion gap: 12 (ref 5–15)
BUN: 14 mg/dL (ref 8–23)
CALCIUM: 9.1 mg/dL (ref 8.9–10.3)
CO2: 21 mmol/L — ABNORMAL LOW (ref 22–32)
CREATININE: 1.13 mg/dL — AB (ref 0.44–1.00)
Chloride: 105 mmol/L (ref 98–111)
GFR, EST AFRICAN AMERICAN: 59 mL/min — AB (ref >60)
GFR, EST NON AFRICAN AMERICAN: 51 mL/min — AB (ref >60)
Glucose, Bld: 171 mg/dL — ABNORMAL HIGH (ref 70–99)
Potassium: 4.2 mmol/L (ref 3.5–5.1)
Sodium: 138 mmol/L (ref 135–145)
Total Bilirubin: 1.1 mg/dL (ref 0.3–1.2)
Total Protein: 7.1 g/dL (ref 6.5–8.1)

## 2018-02-22 LAB — CBC WITH DIFFERENTIAL/PLATELET
ABS IMMATURE GRANULOCYTES: 0.1 10*3/uL (ref 0.0–0.1)
BASOS ABS: 0 10*3/uL (ref 0.0–0.1)
BASOS PCT: 0 %
EOS ABS: 0 10*3/uL (ref 0.0–0.7)
Eosinophils Relative: 0 %
HCT: 46.3 % — ABNORMAL HIGH (ref 36.0–46.0)
Hemoglobin: 14.2 g/dL (ref 12.0–15.0)
Immature Granulocytes: 1 %
Lymphocytes Relative: 8 %
Lymphs Abs: 1.1 10*3/uL (ref 0.7–4.0)
MCH: 27.7 pg (ref 26.0–34.0)
MCHC: 30.7 g/dL (ref 30.0–36.0)
MCV: 90.3 fL (ref 78.0–100.0)
MONO ABS: 0.7 10*3/uL (ref 0.1–1.0)
MONOS PCT: 5 %
NEUTROS ABS: 11.9 10*3/uL — AB (ref 1.7–7.7)
NEUTROS PCT: 86 %
PLATELETS: 257 10*3/uL (ref 150–400)
RBC: 5.13 MIL/uL — ABNORMAL HIGH (ref 3.87–5.11)
RDW: 17.3 % — AB (ref 11.5–15.5)
WBC: 13.8 10*3/uL — ABNORMAL HIGH (ref 4.0–10.5)

## 2018-02-22 LAB — BASIC METABOLIC PANEL
Anion gap: 11 (ref 5–15)
BUN: 15 mg/dL (ref 8–23)
CALCIUM: 8.5 mg/dL — AB (ref 8.9–10.3)
CO2: 21 mmol/L — ABNORMAL LOW (ref 22–32)
CREATININE: 1.09 mg/dL — AB (ref 0.44–1.00)
Chloride: 105 mmol/L (ref 98–111)
GFR calc Af Amer: >60 mL/min (ref >60)
GFR, EST NON AFRICAN AMERICAN: 53 mL/min — AB (ref >60)
GLUCOSE: 145 mg/dL — AB (ref 70–99)
Potassium: 4.2 mmol/L (ref 3.5–5.1)
SODIUM: 137 mmol/L (ref 135–145)

## 2018-02-22 LAB — CBC
HEMATOCRIT: 43.3 % (ref 36.0–46.0)
Hemoglobin: 13.4 g/dL (ref 12.0–15.0)
MCH: 27.6 pg (ref 26.0–34.0)
MCHC: 30.9 g/dL (ref 30.0–36.0)
MCV: 89.1 fL (ref 78.0–100.0)
PLATELETS: 250 10*3/uL (ref 150–400)
RBC: 4.86 MIL/uL (ref 3.87–5.11)
RDW: 17.6 % — AB (ref 11.5–15.5)
WBC: 11.5 10*3/uL — AB (ref 4.0–10.5)

## 2018-02-22 LAB — TROPONIN I
TROPONIN I: 0.35 ng/mL — AB (ref <0.03)
TROPONIN I: 0.35 ng/mL — AB (ref <0.03)
Troponin I: 0.46 ng/mL (ref <0.03)

## 2018-02-22 LAB — MAGNESIUM: Magnesium: 1.9 mg/dL (ref 1.7–2.4)

## 2018-02-22 LAB — HEPARIN LEVEL (UNFRACTIONATED): Heparin Unfractionated: 0.21 IU/mL — ABNORMAL LOW (ref 0.30–0.70)

## 2018-02-22 LAB — HIV ANTIBODY (ROUTINE TESTING W REFLEX): HIV Screen 4th Generation wRfx: NONREACTIVE

## 2018-02-22 LAB — BRAIN NATRIURETIC PEPTIDE: B Natriuretic Peptide: 334.5 pg/mL — ABNORMAL HIGH (ref 0.0–100.0)

## 2018-02-22 LAB — TSH: TSH: 9.842 u[IU]/mL — AB (ref 0.350–4.500)

## 2018-02-22 LAB — MRSA PCR SCREENING: MRSA BY PCR: NEGATIVE

## 2018-02-22 MED ORDER — HEPARIN BOLUS VIA INFUSION
4000.0000 [IU] | Freq: Once | INTRAVENOUS | Status: AC
  Administered 2018-02-22: 4000 [IU] via INTRAVENOUS
  Filled 2018-02-22: qty 4000

## 2018-02-22 MED ORDER — LIDOCAINE HCL (PF) 1 % IJ SOLN
INTRAMUSCULAR | Status: AC
  Filled 2018-02-22: qty 30

## 2018-02-22 MED ORDER — FUROSEMIDE 10 MG/ML IJ SOLN
40.0000 mg | Freq: Two times a day (BID) | INTRAMUSCULAR | Status: DC
  Administered 2018-02-22 – 2018-02-23 (×2): 40 mg via INTRAVENOUS
  Filled 2018-02-22 (×2): qty 4

## 2018-02-22 MED ORDER — TRAMADOL HCL 50 MG PO TABS
50.0000 mg | ORAL_TABLET | Freq: Four times a day (QID) | ORAL | Status: AC | PRN
  Administered 2018-02-22 – 2018-02-24 (×4): 50 mg via ORAL
  Filled 2018-02-22 (×4): qty 1

## 2018-02-22 NOTE — Interval H&P Note (Signed)
History and Physical Interval Note:  02/22/2018 3:47 PM  Carrie Walters  has presented today for surgery, with the diagnosis of cp  The various methods of treatment have been discussed with the patient and family. After consideration of risks, benefits and other options for treatment, the patient has consented to  Procedure(s): LEFT HEART CATH AND CORONARY ANGIOGRAPHY (N/A) and possible coronary angioplasty as a surgical intervention .  The patient's history has been reviewed, patient examined, no change in status, stable for surgery.  I have reviewed the patient's chart and labs.  Questions were answered to the patient's satisfaction.     Daniel Bensimhon

## 2018-02-22 NOTE — Progress Notes (Signed)
ANTICOAGULATION CONSULT NOTE - Follow Up Consult  Pharmacy Consult for Heparin Indication: atrial fibrillation  Allergies  Allergen Reactions  . Metoprolol Tartrate Shortness Of Breath  . Biaxin [Clarithromycin] Other (See Comments)    Sores in mouth  . Contrast Media [Iodinated Diagnostic Agents] Rash    Patient Measurements: Height: 5\' 6"  (167.6 cm) Weight: 290 lb (131.5 kg)(estimate) IBW/kg (Calculated) : 59.3 Heparin Dosing Weight: 91.4 kg  Vital Signs: Temp: 97.7 F (36.5 C) (10/04 2000) Temp Source: Oral (10/04 2000) BP: 100/78 (10/04 2100) Pulse Rate: 82 (10/04 2000)  Labs: Recent Labs    02/21/18 2315 02/22/18 0530 02/22/18 0731 02/22/18 1032 02/22/18 2005  HGB 14.2 13.4  --   --   --   HCT 46.3* 43.3  --   --   --   PLT 257 250  --   --   --   HEPARINUNFRC  --   --  <0.10*  --  0.21*  CREATININE 1.13* 1.09*  --   --   --   TROPONINI 0.46* 0.35*  --  0.35*  --     Estimated Creatinine Clearance: 74.5 mL/min (A) (by C-G formula based on SCr of 1.09 mg/dL (H)).   [name] on IV heparin for Afib following VF arrest. Pt was not on anticoagulation PTA but has been on warfarin in the past.    Heparin level is subtherapeutic at 0.21 on 1700 units/hr. Heparin drip was off for ~20 min around 16:00. No bleeding noted.  Goal of Therapy:  Heparin level 0.3-0.7 units/ml Monitor platelets by anticoagulation protocol: Yes   Plan:  - Increase heparin drip to 1900 units/hr - 6 hr heparin level - Daily heparin level and CBC - Monitor for bleeding complications   Loura Back, PharmD, BCPS Clinical Pharmacist Clinical phone for 02/22/2018 until 10p is [phone] Please check AMION for all Pharmacist numbers by unit 02/22/2018 9:46 PM

## 2018-02-22 NOTE — Progress Notes (Signed)
ANTICOAGULATION CONSULT NOTE - Follow Up Consult  Pharmacy Consult for Heparin Indication: atrial fibrillation  Allergies  Allergen Reactions  . Metoprolol Tartrate Shortness Of Breath  . Biaxin [Clarithromycin] Other (See Comments)    Sores in mouth  . Contrast Media [Iodinated Diagnostic Agents] Rash    Patient Measurements: Height: 5\' 6"  (167.6 cm) Weight: 290 lb (131.5 kg)(estimate) IBW/kg (Calculated) : 59.3 Heparin Dosing Weight: 91.4 kg  Vital Signs: Temp: 97.3 F (36.3 C) (10/04 0753) Temp Source: Axillary (10/04 0753) BP: 91/77 (10/04 0816) Pulse Rate: 93 (10/04 0600)  Labs: Recent Labs    02/21/18 2315 02/22/18 0530 02/22/18 0731  HGB 14.2 13.4  --   HCT 46.3* 43.3  --   PLT 257 250  --   HEPARINUNFRC  --   --  <0.10*  CREATININE 1.13* 1.09*  --   TROPONINI 0.46* 0.35*  --     Estimated Creatinine Clearance: 74.5 mL/min (A) (by C-G formula based on SCr of 1.09 mg/dL (H)).   [name] on heparin for atrial fibrillation. Pt was not on anticoagulation PTA. CBC stable and WNL. No signs of bleeding or issues with infusion per nursing. HL undetectable (<0.10) after 4000 unit heparin bolus and heparin infusion at 1300 units/h. Will re-bolus heparin and increase rate.  Goal of Therapy:  Heparin level 0.3-0.7 units/ml Monitor platelets by anticoagulation protocol: Yes   Plan:  - Re-bolus Heparin 4000 units - Increase Heparin 1700 units/h - Check 8h HL and daily CBC - Monitor for bleeding complications  Tama Headings, PharmD Candidate 02/22/2018,9:04 AM

## 2018-02-22 NOTE — Consult Note (Signed)
WOC Nurse wound consult note Reason for Consult: sacrum  However upon arrival patient she has dressings on both of her LEs. She is followed by the [place] wound care center weekly and previously had a HHRN for care for her legs.  She has worn compression in the past but can not tolerate them due to pain. She self reports she has a pilonidal cyst at the top of the gluteal cleft that has been present for a "while".  Due to chest discomfort from CPR she is refusing to turn for me to assess this area.  Wound type:venous stasis disease with weeping. LLE with circumferential are of weeping partial thickness ulceration over the malleolar region. Healed area noted on the lateral aspect of the RLE, with serous crust   Pressure Injury POA: NA  Measurement: NA Wound bed: see above  Drainage (amount, consistency, odor) serous, musty odor from the LLE Periwound:2+ edema, dopplered distal pulses  Dressing procedure/placement/frequency: Silver hydrofiber to the LLE weeping areas, cover with ABD pads and secure with kerlix Silicone foam to the RLE to protect fragile skin. No topical care ordered for reported "cyst", will attempt to see this area later in the day if patient will allow  Discussed POC with patient and bedside nurse.  Re consult if needed, will not follow at this time. Thanks  Mardi Cannady [employer], RN,CWOCN, CNS, CWON-AP [phone])

## 2018-02-22 NOTE — H&P (View-Only) (Signed)
 Progress Note  Patient Name: Carrie Walters Date of Encounter: 02/22/2018  Primary Cardiologist: No primary care provider on file.  Subjective   Patient feels okay this morning. Endorses some palpitations. No chest pain or shortness of breath. Does endorse chest wall tenderness (due to CPR on 10/3).   Inpatient Medications    Scheduled Meds: . furosemide  40 mg Oral Daily  . levothyroxine  25 mcg Oral QAC breakfast  . metolazone  2.5 mg Oral Daily  . nystatin   Topical TID  . verapamil  180 mg Oral BID   Continuous Infusions: . amiodarone 30 mg/hr (02/22/18 0900)  . heparin 1,700 Units/hr (02/22/18 0948)   PRN Meds: acetaminophen, albuterol, ondansetron (ZOFRAN) IV   Vital Signs    Vitals:   02/22/18 0753 02/22/18 0800 02/22/18 0816 02/22/18 0900  BP:  (!) 88/73 91/77 97/74  Pulse:  79  78  Resp:  (!) 28  (!) 30  Temp: (!) 97.3 F (36.3 C)     TempSrc: Axillary     SpO2:  94%  94%  Weight:      Height:        Intake/Output Summary (Last 24 hours) at 02/22/2018 1042 Last data filed at 02/22/2018 0900 Gross per 24 hour  Intake 499.42 ml  Output 270 ml  Net 229.42 ml   Filed Weights   02/21/18 2200  Weight: 131.5 kg    Telemetry    Atrial flutter 100s-120s  - Personally Reviewed  ECG    Atrial flutter with variable block - Personally Reviewed  Physical Exam   GEN: No acute distress. Obese.   Neck: No JVD Cardiac: Irr Irr, tachycardic; no murmurs, rubs, or gallops.  Respiratory: Distant 2/2 body habitus. Clear to auscultation bilaterally. GI: Soft, nontender, non-distended  MS: Bilateral edema; Venous stasis changes, wounds with clean/dry dressings. 1+ pedal pulses bilaterally. Neuro:  Nonfocal  Psych: Normal affect   Labs    Chemistry Recent Labs  Lab 02/21/18 2315 02/22/18 0530  NA 138 137  K 4.2 4.2  CL 105 105  CO2 21* 21*  GLUCOSE 171* 145*  BUN 14 15  CREATININE 1.13* 1.09*  CALCIUM 9.1 8.5*  PROT 7.1  --   ALBUMIN 3.6  --     AST 37  --   ALT 23  --   ALKPHOS 87  --   BILITOT 1.1  --   GFRNONAA 51* 53*  GFRAA 59* >60  ANIONGAP 12 11     Hematology Recent Labs  Lab 02/21/18 2315 02/22/18 0530  WBC 13.8* 11.5*  RBC 5.13* 4.86  HGB 14.2 13.4  HCT 46.3* 43.3  MCV 90.3 89.1  MCH 27.7 27.6  MCHC 30.7 30.9  RDW 17.3* 17.6*  PLT 257 250    Cardiac Enzymes Recent Labs  Lab 02/21/18 2315 02/22/18 0530  TROPONINI 0.46* 0.35*   No results for input(s): TROPIPOC in the last 168 hours.   BNP Recent Labs  Lab 02/21/18 2315  BNP 334.5*     DDimer No results for input(s): DDIMER in the last 168 hours.   Radiology    No results found.  Cardiac Studies   Limited Echo 10/4 Study Conclusions - Left ventricle: The cavity size was moderately dilated. Wall   thickness was increased in a pattern of mild LVH. Systolic   function was severely reduced. The estimated ejection fraction   was in the range of 25% to 30%. Diffuse hypokinesis. Impressions: - Limited echo. A-fib is   noted. LVEF 25-30%, mild LVH, moderately   dilated LV, LV mid-ventricle false tendon, severe global   hypokinesis.  Patient Profile     62 y.o. female presented from [facility] after apparent V-fib arrest, ROSC achieved after single defibrillation shock. Rhythm wsa A.Fib with RVR following this event.   Assessment & Plan    1) A. Fib: Patient presented in A.fib on Amiodarine gtt after being in RVR following apparent V-Fib arrest at OSH. No strips available to verify rhythm surrounding and during arrest. Patient has been maintained on Amiodarone as BP likely too soft for Diltiazem (and now reduce EF on Echo), and patient has refused BB in the past due to adverse reaction. Patient will need ischemic evaluation given reduced ef on echo. Would like to get better rate control as patient remains tachy to the 120s-130s. Of note, patient has apparently been off of coumadin since moving to [place] about a year ago; has 4-5 year  history of A.fib with report previous DC-CV. - EP consult - Continue amiodarone gtt, heparin gtt - Continue home verapamil 180mg BID  2) Apparent Cardiac arrest: No rhythm strops available from event. Reported V. Fib arrest. Unclear etiology. Has A.fib off of anticoagulation for some time. Will need ischemic workup, given reduced EF on echo with hypokinesis. Troponin trend downtrending/flat 0.46>0.35>0.35.  3) Hypothyroidism: TSH 9.8; Synthroid 25mcg Daily has been started.  4) Hypertension/Hypotension: Holding home antihypertensives. BP remains soft today, 80s-100s systolic.  5) Hypoxia: Oxygenation has improved. Now on [place]. Continue Diuretics.   For questions or updates, please contact [facility] Please consult [email or web address] for contact info under       Signed, Alexander Melvin, MD  02/22/2018, 10:42 AM    I have examined the patient and reviewed assessment and plan and discussed with patient.  Agree with above as stated.  No rhythm strips available.  Reportedly VF, therefore will plan for cath.  Echo shows EF 25%..  ? Ischemic vs. Tachycardia induced.  EP eval based on cath results.  She will need medical therapy for systolic dysfunction.  Will try to get her on ACE-I.  SHe is intolerant of beta blockers.    Shandi Godfrey  

## 2018-02-22 NOTE — Progress Notes (Addendum)
Progress Note  Patient Name: Carrie Walters Date of Encounter: 02/22/2018  Primary Cardiologist: No primary care provider on file.  Subjective   Patient feels okay this morning. Endorses some palpitations. No chest pain or shortness of breath. Does endorse chest wall tenderness (due to CPR on 10/3).   Inpatient Medications    Scheduled Meds: . furosemide  40 mg Oral Daily  . levothyroxine  25 mcg Oral QAC breakfast  . metolazone  2.5 mg Oral Daily  . nystatin   Topical TID  . verapamil  180 mg Oral BID   Continuous Infusions: . amiodarone 30 mg/hr (02/22/18 0900)  . heparin 1,700 Units/hr (02/22/18 0948)   PRN Meds: acetaminophen, albuterol, ondansetron (ZOFRAN) IV   Vital Signs    Vitals:   02/22/18 0753 02/22/18 0800 02/22/18 0816 02/22/18 0900  BP:  (!) 88/73 91/77 97/74   Pulse:  79  78  Resp:  (!) 28  (!) 30  Temp: (!) 97.3 F (36.3 C)     TempSrc: Axillary     SpO2:  94%  94%  Weight:      Height:        Intake/Output Summary (Last 24 hours) at 02/22/2018 1042 Last data filed at 02/22/2018 0900 Gross per 24 hour  Intake 499.42 ml  Output 270 ml  Net 229.42 ml   Filed Weights   02/21/18 2200  Weight: 131.5 kg    Telemetry    Atrial flutter 100s-120s  - Personally Reviewed  ECG    Atrial flutter with variable block - Personally Reviewed  Physical Exam   GEN: No acute distress. Obese.   Neck: No JVD Cardiac: Irr Irr, tachycardic; no murmurs, rubs, or gallops.  Respiratory: Distant 2/2 body habitus. Clear to auscultation bilaterally. GI: Soft, nontender, non-distended  MS: Bilateral edema; Venous stasis changes, wounds with clean/dry dressings. 1+ pedal pulses bilaterally. Neuro:  Nonfocal  Psych: Normal affect   Labs    Chemistry Recent Labs  Lab 02/21/18 2315 02/22/18 0530  NA 138 137  K 4.2 4.2  CL 105 105  CO2 21* 21*  GLUCOSE 171* 145*  BUN 14 15  CREATININE 1.13* 1.09*  CALCIUM 9.1 8.5*  PROT 7.1  --   ALBUMIN 3.6  --     AST 37  --   ALT 23  --   ALKPHOS 87  --   BILITOT 1.1  --   GFRNONAA 51* 53*  GFRAA 59* >60  ANIONGAP 12 11     Hematology Recent Labs  Lab 02/21/18 2315 02/22/18 0530  WBC 13.8* 11.5*  RBC 5.13* 4.86  HGB 14.2 13.4  HCT 46.3* 43.3  MCV 90.3 89.1  MCH 27.7 27.6  MCHC 30.7 30.9  RDW 17.3* 17.6*  PLT 257 250    Cardiac Enzymes Recent Labs  Lab 02/21/18 2315 02/22/18 0530  TROPONINI 0.46* 0.35*   No results for input(s): TROPIPOC in the last 168 hours.   BNP Recent Labs  Lab 02/21/18 2315  BNP 334.5*     DDimer No results for input(s): DDIMER in the last 168 hours.   Radiology    No results found.  Cardiac Studies   Limited Echo 10/4 Study Conclusions - Left ventricle: The cavity size was moderately dilated. Wall   thickness was increased in a pattern of mild LVH. Systolic   function was severely reduced. The estimated ejection fraction   was in the range of 25% to 30%. Diffuse hypokinesis. Impressions: - Limited echo. A-fib is  noted. LVEF 25-30%, mild LVH, moderately   dilated LV, LV mid-ventricle false tendon, severe global   hypokinesis.  Patient Profile     62 y.o. female presented from [facility] after apparent V-fib arrest, ROSC achieved after single defibrillation shock. Rhythm wsa A.Fib with RVR following this event.   Assessment & Plan    1) A. Fib: Patient presented in A.fib on Amiodarine gtt after being in RVR following apparent V-Fib arrest at OSH. No strips available to verify rhythm surrounding and during arrest. Patient has been maintained on Amiodarone as BP likely too soft for Diltiazem (and now reduce EF on Echo), and patient has refused BB in the past due to adverse reaction. Patient will need ischemic evaluation given reduced ef on echo. Would like to get better rate control as patient remains tachy to the 120s-130s. Of note, patient has apparently been off of coumadin since moving to [place] about a year ago; has 4-5 year  history of A.fib with report previous DC-CV. - EP consult - Continue amiodarone gtt, heparin gtt - Continue home verapamil 180mg  BID  2) Apparent Cardiac arrest: No rhythm strops available from event. Reported V. Fib arrest. Unclear etiology. Has A.fib off of anticoagulation for some time. Will need ischemic workup, given reduced EF on echo with hypokinesis. Troponin trend downtrending/flat 0.46>0.35>0.35.  3) Hypothyroidism: TSH 9.8; Synthroid Daily has been started.  4) Hypertension/Hypotension: Holding home antihypertensives. BP remains soft today, 80s-100s systolic.  5) Hypoxia: Oxygenation has improved. Now on [place]. Continue Diuretics.   For questions or updates, please contact [facility] Please consult [email or web address] for contact info under       Signed, Beola Cord, MD  02/22/2018, 10:42 AM    I have examined the patient and reviewed assessment and plan and discussed with patient.  Agree with above as stated.  No rhythm strips available.  Reportedly VF, therefore will plan for cath.  Echo shows EF 25%..  ? Ischemic vs. Tachycardia induced.  EP eval based on cath results.  She will need medical therapy for systolic dysfunction.  Will try to get her on ACE-I.  SHe is intolerant of beta blockers.    Lance Muss

## 2018-02-22 NOTE — Progress Notes (Signed)
*  PRELIMINARY RESULTS* Echocardiogram 2D Echocardiogram has been performed.  Carrie Walters 02/22/2018, 9:19 AM

## 2018-02-23 ENCOUNTER — Other Ambulatory Visit: Payer: Self-pay

## 2018-02-23 DIAGNOSIS — I5021 Acute systolic (congestive) heart failure: Secondary | ICD-10-CM

## 2018-02-23 LAB — CBC
HCT: 45.5 % (ref 36.0–46.0)
Hemoglobin: 14.2 g/dL (ref 12.0–15.0)
MCH: 27.9 pg (ref 26.0–34.0)
MCHC: 31.2 g/dL (ref 30.0–36.0)
MCV: 89.4 fL (ref 78.0–100.0)
Platelets: 227 10*3/uL (ref 150–400)
RBC: 5.09 MIL/uL (ref 3.87–5.11)
RDW: 17.6 % — AB (ref 11.5–15.5)
WBC: 10 10*3/uL (ref 4.0–10.5)

## 2018-02-23 LAB — HEPARIN LEVEL (UNFRACTIONATED)
Heparin Unfractionated: 0.34 IU/mL (ref 0.30–0.70)
Heparin Unfractionated: 0.31 IU/mL (ref 0.30–0.70)

## 2018-02-23 MED ORDER — SPIRONOLACTONE 12.5 MG HALF TABLET
12.5000 mg | ORAL_TABLET | Freq: Every day | ORAL | Status: DC
  Administered 2018-02-23 – 2018-02-24 (×2): 12.5 mg via ORAL
  Filled 2018-02-23 (×2): qty 1

## 2018-02-23 MED ORDER — LOSARTAN POTASSIUM 25 MG PO TABS
12.5000 mg | ORAL_TABLET | Freq: Every day | ORAL | Status: DC
  Administered 2018-02-23 – 2018-02-24 (×2): 12.5 mg via ORAL
  Filled 2018-02-23 (×2): qty 1

## 2018-02-23 MED ORDER — DIGOXIN 125 MCG PO TABS
0.1250 mg | ORAL_TABLET | Freq: Every day | ORAL | Status: DC
  Administered 2018-02-23 – 2018-03-01 (×7): 0.125 mg via ORAL
  Filled 2018-02-23 (×7): qty 1

## 2018-02-23 MED ORDER — FUROSEMIDE 10 MG/ML IJ SOLN
80.0000 mg | Freq: Two times a day (BID) | INTRAMUSCULAR | Status: DC
  Administered 2018-02-23 – 2018-02-26 (×6): 80 mg via INTRAVENOUS
  Filled 2018-02-23 (×6): qty 8

## 2018-02-23 MED ORDER — FUROSEMIDE 10 MG/ML IJ SOLN
40.0000 mg | Freq: Once | INTRAMUSCULAR | Status: AC
  Administered 2018-02-23: 40 mg via INTRAVENOUS
  Filled 2018-02-23: qty 4

## 2018-02-23 MED ORDER — ORAL CARE MOUTH RINSE
15.0000 mL | Freq: Two times a day (BID) | OROMUCOSAL | Status: DC
  Administered 2018-02-23 – 2018-03-01 (×9): 15 mL via OROMUCOSAL

## 2018-02-23 NOTE — Progress Notes (Signed)
ANTICOAGULATION CONSULT NOTE - Follow Up Consult  Pharmacy Consult for Heparin Indication: atrial fibrillation  Allergies  Allergen Reactions  . Metoprolol Tartrate Shortness Of Breath  . Biaxin [Clarithromycin] Other (See Comments)    Sores in mouth  . Contrast Media [Iodinated Diagnostic Agents] Rash    Patient Measurements: Height: 5\' 6"  (167.6 cm) Weight: 290 lb (131.5 kg)(estimate) IBW/kg (Calculated) : 59.3 Heparin Dosing Weight: 91.4 kg  Vital Signs: Temp: 97.7 F (36.5 C) (10/05 0400) Temp Source: Oral (10/05 0400) BP: 119/68 (10/05 0500) Pulse Rate: 47 (10/05 0500)  Labs: Recent Labs    02/21/18 2315 02/22/18 0530 02/22/18 0731 02/22/18 1032 02/22/18 2005 02/23/18 0502  HGB 14.2 13.4  --   --   --  14.2  HCT 46.3* 43.3  --   --   --  45.5  PLT 257 250  --   --   --  227  HEPARINUNFRC  --   --  <0.10*  --  0.21* 0.34  CREATININE 1.13* 1.09*  --   --   --   --   TROPONINI 0.46* 0.35*  --  0.35*  --   --     Estimated Creatinine Clearance: 74.5 mL/min (A) (by C-G formula based on SCr of 1.09 mg/dL (H)).   [name] on IV heparin for Afib following VF arrest. Pt was not on anticoagulation PTA but has been on warfarin in the past.    Heparin level therapeutic (0.34) on gtt at 1700 units/hr. No issues with bleeding noted.  Goal of Therapy:  Heparin level 0.3-0.7 units/ml Monitor platelets by anticoagulation protocol: Yes   Plan:  - Continue heparin drip at 1900 units/hr - F/u 6 hr confirmatory heparin level - Daily heparin level and CBC - Monitor for bleeding complications   Christoper Fabian, PharmD, BCPS Clinical pharmacist  **Pharmacist phone directory can now be found on [email or web address] ([email or web address]).  Listed under [facility]. 02/23/2018 6:18 AM

## 2018-02-23 NOTE — Progress Notes (Signed)
ANTICOAGULATION CONSULT NOTE - Follow Up Consult  Pharmacy Consult for Heparin Indication: atrial fibrillation  Allergies  Allergen Reactions  . Metoprolol Tartrate Shortness Of Breath  . Biaxin [Clarithromycin] Other (See Comments)    Sores in mouth  . Contrast Media [Iodinated Diagnostic Agents] Rash    Patient Measurements: Height: 5\' 6"  (167.6 cm) Weight: 290 lb (131.5 kg)(estimate) IBW/kg (Calculated) : 59.3 Heparin Dosing Weight: 91.4 kg  Vital Signs: Temp: 98 F (36.7 C) (10/05 1121) Temp Source: Oral (10/05 1121) BP: 126/79 (10/05 1200) Pulse Rate: 36 (10/05 1200)  Labs: Recent Labs    02/21/18 2315 02/22/18 0530  02/22/18 1032 02/22/18 2005 02/23/18 0502 02/23/18 1117  HGB 14.2 13.4  --   --   --  14.2  --   HCT 46.3* 43.3  --   --   --  45.5  --   PLT 257 250  --   --   --  227  --   HEPARINUNFRC  --   --    < >  --  0.21* 0.34 0.31  CREATININE 1.13* 1.09*  --   --   --   --   --   TROPONINI 0.46* 0.35*  --  0.35*  --   --   --    < > = values in this interval not displayed.    Estimated Creatinine Clearance: 74.5 mL/min (A) (by C-G formula based on SCr of 1.09 mg/dL (H)).   [name] on IV heparin for Afib following VF arrest. Pt was not on anticoagulation PTA but has been on warfarin in the past.    Heparin level therapeutic at 0.31 on heparin 1900 units/hr. No issues with bleeding noted.  Goal of Therapy:  Heparin level 0.3-0.7 units/ml Monitor platelets by anticoagulation protocol: Yes   Plan:  - Increase heparin drip at 1950 units/hr - Daily heparin level and CBC - Monitor for bleeding complications   Marcelino Freestone, PharmD PGY2 Cardiology Pharmacy Resident Phone [phone] Please check AMION for all Pharmacist numbers by unit 02/23/2018 12:41 PM

## 2018-02-23 NOTE — Plan of Care (Signed)
  Problem: Education: Goal: Knowledge of General Education information will improve Description Including pain rating scale, medication(s)/side effects and non-pharmacologic comfort measures Outcome: Progressing   Problem: Health Behavior/Discharge Planning: Goal: Ability to manage health-related needs will improve Outcome: Progressing   Problem: Clinical Measurements: Goal: Ability to maintain clinical measurements within normal limits will improve Outcome: Progressing Goal: Will remain free from infection Outcome: Progressing Goal: Respiratory complications will improve Outcome: Progressing Goal: Cardiovascular complication will be avoided Outcome: Progressing   Problem: Activity: Goal: Risk for activity intolerance will decrease Outcome: Progressing   Problem: Nutrition: Goal: Adequate nutrition will be maintained Outcome: Progressing   Problem: Coping: Goal: Level of anxiety will decrease Outcome: Progressing   Problem: Elimination: Goal: Will not experience complications related to bowel motility Outcome: Progressing   Problem: Pain Managment: Goal: General experience of comfort will improve Outcome: Progressing   Problem: Safety: Goal: Ability to remain free from injury will improve Outcome: Progressing   Problem: Skin Integrity: Goal: Risk for impaired skin integrity will decrease Outcome: Not Progressing Note:  Wound care consult orders being followed as given. Pt non compliant with turning and moving in bed. RN will continue to reinforce importance of turning.

## 2018-02-23 NOTE — Progress Notes (Signed)
Patient ID: Carrie Walters, female   DOB: 05-16-1956, 62 y.o.   MRN: [id number]     Advanced Heart Failure Rounding Note  PCP-Cardiologist: No primary care provider on file.   Subjective:    Mild dyspnea but overall feels better.  She remains in atrial fibrillation, rate in 100s on amiodarone gtt.  No chest pain.   Echo: Moderately dilated LV with EF 25-30%.     Objective:   Weight Range: 131.5 kg Body mass index is 46.81 kg/m.   Vital Signs:   Temp:  [97.6 F (36.4 C)-98.2 F (36.8 C)] 97.9 F (36.6 C) (10/05 0721) Pulse Rate:  [30-106] 106 (10/05 0800) Resp:  [21-34] 24 (10/05 0800) BP: (79-119)/(50-81) 94/69 (10/05 0800) SpO2:  [91 %-98 %] 96 % (10/05 0800) Last BM Date: 02/20/18  Weight change: Filed Weights   02/21/18 2200  Weight: 131.5 kg    Intake/Output:   Intake/Output Summary (Last 24 hours) at 02/23/2018 0922 Last data filed at 02/23/2018 0800 Gross per 24 hour  Intake 1379.01 ml  Output 880 ml  Net 499.01 ml      Physical Exam    General: NAD Neck: JVP 12+ cm, no thyromegaly or thyroid nodule.  Lungs: Clear to auscultation bilaterally with normal respiratory effort. CV: Nonpalpable PMI.  Heart irregular, mildly tachy S1/S2, no S3/S4, no murmur.  1+ ankle edema.   Abdomen: Soft, nontender, no hepatosplenomegaly, no distention.  Skin: Intact without lesions or rashes.  Neurologic: Alert and oriented x 3.  Psych: Normal affect. Extremities: No clubbing or cyanosis. Lower leg venous stasis changes.  HEENT: Normal.    Telemetry   Atrial fibrillation rate 100s (personally reviewed).   Labs    CBC Recent Labs    02/21/18 2315 02/22/18 0530 02/23/18 0502  WBC 13.8* 11.5* 10.0  NEUTROABS 11.9*  --   --   HGB 14.2 13.4 14.2  HCT 46.3* 43.3 45.5  MCV 90.3 89.1 89.4  PLT 257 250 227   Basic Metabolic Panel Recent Labs    72/90/21 2315 02/22/18 0530  NA 138 137  K 4.2 4.2  CL 105 105  CO2 21* 21*  GLUCOSE 171* 145*  BUN 14 15    CREATININE 1.13* 1.09*  CALCIUM 9.1 8.5*  MG 1.9  --    Liver Function Tests Recent Labs    02/21/18 2315  AST 37  ALT 23  ALKPHOS 87  BILITOT 1.1  PROT 7.1  ALBUMIN 3.6   No results for input(s): LIPASE, AMYLASE in the last 72 hours. Cardiac Enzymes Recent Labs    02/21/18 2315 02/22/18 0530 02/22/18 1032  TROPONINI 0.46* 0.35* 0.35*    BNP: BNP (last 3 results) Recent Labs    02/21/18 2315  BNP 334.5*    ProBNP (last 3 results) No results for input(s): PROBNP in the last 8760 hours.   D-Dimer No results for input(s): DDIMER in the last 72 hours. Hemoglobin A1C No results for input(s): HGBA1C in the last 72 hours. Fasting Lipid Panel No results for input(s): CHOL, HDL, LDLCALC, TRIG, CHOLHDL, LDLDIRECT in the last 72 hours. Thyroid Function Tests Recent Labs    02/21/18 2315  TSH 9.842*    Other results:   Imaging     No results found.   Medications:     Scheduled Medications: . digoxin  0.125 mg Oral Daily  . furosemide  40 mg Intravenous Once  . furosemide  80 mg Intravenous BID  . levothyroxine  25 mcg Oral QAC  breakfast  . losartan  12.5 mg Oral Daily  . nystatin   Topical TID  . spironolactone  12.5 mg Oral Daily     Infusions: . amiodarone 30 mg/hr (02/23/18 0700)  . heparin 1,900 Units/hr (02/23/18 0700)     PRN Medications:  acetaminophen, albuterol, ondansetron (ZOFRAN) IV, traMADol    Patient Profile   62 yo with history of apparent chronic atrial fibrillation (had in [place] prior to moving to [facility]) had apparent ventricular fibrillation arrest with 1 shock and was taken to [facility], later transferred to [facility]. She was in atrial fibrillation with RVR after shock.    Assessment/Plan   1. Cardiac arrest: VF per report, no strips available.  Shocked x 1, then in atrial fibrillation.  Mild troponin elevation with no trend.  Given low EF, cannot rule out ischemic event.  2. Atrial fibrillation: Patient moved to  [place] recently from [place].  Was seen by Dr. Tomie China in 8/19, was in atrial fibrillation.  Says that she was in atrial fibrillation prior to moving down from [place].  Possibly x several years.  Remembers 1 cardioversion from the past.  - On heparin gtt for now, transition over to Eliquis after cath on Monday.  - She remains on amiodarone gtt for rate control, HR in 100s.   - Would favor attempting DCCV while on amiodarone.  Will need TEE prior.  Can arrange for this later in week after Monday's cath.  - Home digoxin restarted to help with rate control.  - Add Coreg at low dose tomorrow after some diuresis.  She apparently did not tolerate metoprolol well in the past.  3. Acute systolic CHF: Echo with EF 25-30%, diffuse hypokinesis.  No prior echoes, no definite history of CHF.  She does have chronic venous stasis changes on her legs.  Cause of cardiomyopathy uncertain.  Could be tachycardia-mediated.  However, need to rule out coronary disease.  On exam, she is volume overloaded.  She has had some Lasix while here but cannot tell response as no I/Os recorded or weights.  - Increase Lasix to 80 mg IV bid, give extra dose now.  - Add low dose losartan and spironolactone.  - Restart home digoxin.   - Low dose Coreg tomorrow if she diureses a bit.  - Plan for RHC/LHC on Monday, scheduled.   4. Venous stasis changes lower legs: Wound care consult.  5. Hypothyroidism: Low dose Synthroid.   Length of Stay: 2  Marca Ancona, MD  02/23/2018, 9:22 AM  Advanced Heart Failure Team Pager [phone] (M-F; 7a - 4p)  Please contact [facility] for night-coverage after hours (4p -7a ) and weekends on [email or web address]

## 2018-02-24 DIAGNOSIS — I4819 Other persistent atrial fibrillation: Secondary | ICD-10-CM

## 2018-02-24 LAB — BASIC METABOLIC PANEL
Anion gap: 11 (ref 5–15)
BUN: 23 mg/dL (ref 8–23)
CALCIUM: 9.1 mg/dL (ref 8.9–10.3)
CO2: 34 mmol/L — AB (ref 22–32)
Chloride: 90 mmol/L — ABNORMAL LOW (ref 98–111)
Creatinine, Ser: 1.04 mg/dL — ABNORMAL HIGH (ref 0.44–1.00)
GFR calc Af Amer: >60 mL/min (ref >60)
GFR calc non Af Amer: 56 mL/min — ABNORMAL LOW (ref >60)
GLUCOSE: 133 mg/dL — AB (ref 70–99)
Potassium: 3 mmol/L — ABNORMAL LOW (ref 3.5–5.1)
Sodium: 135 mmol/L (ref 135–145)

## 2018-02-24 LAB — POCT I-STAT 4, (NA,K, GLUC, HGB,HCT)
Glucose, Bld: 131 mg/dL — ABNORMAL HIGH (ref 70–99)
HCT: 41 % (ref 36.0–46.0)
Hemoglobin: 13.9 g/dL (ref 12.0–15.0)
Potassium: 3 mmol/L — ABNORMAL LOW (ref 3.5–5.1)
Sodium: 137 mmol/L (ref 135–145)

## 2018-02-24 LAB — CBC
HEMATOCRIT: 44.9 % (ref 36.0–46.0)
Hemoglobin: 14 g/dL (ref 12.0–15.0)
MCH: 27.8 pg (ref 26.0–34.0)
MCHC: 31.2 g/dL (ref 30.0–36.0)
MCV: 89.3 fL (ref 78.0–100.0)
PLATELETS: 222 10*3/uL (ref 150–400)
RBC: 5.03 MIL/uL (ref 3.87–5.11)
RDW: 17.7 % — ABNORMAL HIGH (ref 11.5–15.5)
WBC: 10.8 10*3/uL — AB (ref 4.0–10.5)

## 2018-02-24 LAB — HEPARIN LEVEL (UNFRACTIONATED)
Heparin Unfractionated: 0.12 IU/mL — ABNORMAL LOW (ref 0.30–0.70)
Heparin Unfractionated: 0.22 IU/mL — ABNORMAL LOW (ref 0.30–0.70)
Heparin Unfractionated: 0.37 IU/mL (ref 0.30–0.70)

## 2018-02-24 MED ORDER — TRAMADOL HCL 50 MG PO TABS
50.0000 mg | ORAL_TABLET | Freq: Four times a day (QID) | ORAL | Status: DC | PRN
  Administered 2018-02-24 – 2018-03-01 (×10): 50 mg via ORAL
  Filled 2018-02-24 (×11): qty 1

## 2018-02-24 MED ORDER — POTASSIUM CHLORIDE CRYS ER 20 MEQ PO TBCR
40.0000 meq | EXTENDED_RELEASE_TABLET | Freq: Two times a day (BID) | ORAL | Status: DC
  Administered 2018-02-24 – 2018-02-25 (×3): 40 meq via ORAL
  Filled 2018-02-24 (×3): qty 2

## 2018-02-24 MED ORDER — CARVEDILOL 3.125 MG PO TABS
3.1250 mg | ORAL_TABLET | Freq: Two times a day (BID) | ORAL | Status: DC
  Administered 2018-02-24 – 2018-02-26 (×4): 3.125 mg via ORAL
  Filled 2018-02-24 (×4): qty 1

## 2018-02-24 MED ORDER — LOSARTAN POTASSIUM 25 MG PO TABS
25.0000 mg | ORAL_TABLET | Freq: Every day | ORAL | Status: DC
  Administered 2018-02-25 – 2018-02-26 (×2): 25 mg via ORAL
  Filled 2018-02-24 (×2): qty 1

## 2018-02-24 MED ORDER — POTASSIUM CHLORIDE CRYS ER 20 MEQ PO TBCR
40.0000 meq | EXTENDED_RELEASE_TABLET | Freq: Once | ORAL | Status: AC
  Administered 2018-02-24: 40 meq via ORAL
  Filled 2018-02-24: qty 2

## 2018-02-24 MED ORDER — SPIRONOLACTONE 25 MG PO TABS
25.0000 mg | ORAL_TABLET | Freq: Every day | ORAL | Status: DC
  Administered 2018-02-25 – 2018-03-01 (×5): 25 mg via ORAL
  Filled 2018-02-24 (×5): qty 1

## 2018-02-24 NOTE — H&P (View-Only) (Signed)
Patient ID: Carrie Walters, female   DOB: [birth year], 62 y.o.   MRN: [id number]     Advanced Heart Failure Rounding Note  PCP-Cardiologist: No primary care provider on file.   Subjective:    Breathing better. Sats in high 90s on [place] 6L. Chest still sore from chest compressions.  She remains in atrial fibrillation, rate in 100-105s on amiodarone gtt.   Diuresing briskly on IV lasix. Weigh down 9 pounds overnight. SBP stable.  Echo: Moderately dilated LV with EF 25-30%.     Objective:   Weight Range: 127 kg Body mass index is 45.19 kg/m.   Vital Signs:   Temp:  [97.9 F (36.6 C)-98.4 F (36.9 C)] 98 F (36.7 C) (10/06 0722) Pulse Rate:  [27-106] 92 (10/06 1000) Resp:  [18-31] 22 (10/06 1000) BP: (85-126)/(63-90) 107/81 (10/06 1000) SpO2:  [94 %-99 %] 98 % (10/06 1000) Weight:  [127 kg] 127 kg (10/06 0600) Last BM Date: 02/20/18  Weight change: Filed Weights   02/21/18 2200 02/23/18 1000 02/24/18 0600  Weight: 131.5 kg 130.7 kg 127 kg    Intake/Output:   Intake/Output Summary (Last 24 hours) at 02/24/2018 1022 Last data filed at 02/24/2018 1000 Gross per 24 hour  Intake 1335.06 ml  Output 6650 ml  Net -5314.94 ml      Physical Exam    General:  Obese woman sitting up in bed. No resp difficulty HEENT: normal Neck: supple. JVP to jaw. Carotids 2+ bilat; no bruits. No lymphadenopathy or thryomegaly appreciated. Cor: PMI nondisplaced. Irregular rate & rhythm. 2/6 TR Lungs: clear Abdomen: obese soft, nontender, nondistended. No hepatosplenomegaly. No bruits or masses. Good bowel sounds. Extremities: no cyanosis, clubbing, rash, 1-2+ edema with venous stasis changes ad small wounds  Neuro: alert & orientedx3, cranial nerves grossly intact. moves all 4 extremities w/o difficulty. Affect pleasant   Telemetry   Atrial fibrillation rate 100-105s (personally reviewed).   Labs    CBC Recent Labs    02/21/18 2315  02/23/18 0502 02/24/18 0234 02/24/18 0757  WBC  13.8*   < > 10.0 10.8*  --   NEUTROABS 11.9*  --   --   --   --   HGB 14.2   < > 14.2 14.0 13.9  HCT 46.3*   < > 45.5 44.9 41.0  MCV 90.3   < > 89.4 89.3  --   PLT 257   < > 227 222  --    < > = values in this interval not displayed.   Basic Metabolic Panel Recent Labs    40/98/11 2315 02/22/18 0530 02/24/18 0757 02/24/18 0809  NA 138 137 137 135  K 4.2 4.2 3.0* 3.0*  CL 105 105  --  90*  CO2 21* 21*  --  34*  GLUCOSE 171* 145* 131* 133*  BUN 14 15  --  23  CREATININE 1.13* 1.09*  --  1.04*  CALCIUM 9.1 8.5*  --  9.1  MG 1.9  --   --   --    Liver Function Tests Recent Labs    02/21/18 2315  AST 37  ALT 23  ALKPHOS 87  BILITOT 1.1  PROT 7.1  ALBUMIN 3.6   No results for input(s): LIPASE, AMYLASE in the last 72 hours. Cardiac Enzymes Recent Labs    02/21/18 2315 02/22/18 0530 02/22/18 1032  TROPONINI 0.46* 0.35* 0.35*    BNP: BNP (last 3 results) Recent Labs    02/21/18 2315  BNP 334.5*  ProBNP (last 3 results) No results for input(s): PROBNP in the last 8760 hours.   D-Dimer No results for input(s): DDIMER in the last 72 hours. Hemoglobin A1C No results for input(s): HGBA1C in the last 72 hours. Fasting Lipid Panel No results for input(s): CHOL, HDL, LDLCALC, TRIG, CHOLHDL, LDLDIRECT in the last 72 hours. Thyroid Function Tests Recent Labs    02/21/18 2315  TSH 9.842*    Other results:   Imaging    No results found.   Medications:     Scheduled Medications: . digoxin  0.125 mg Oral Daily  . furosemide  80 mg Intravenous BID  . levothyroxine  25 mcg Oral QAC breakfast  . losartan  12.5 mg Oral Daily  . mouth rinse  15 mL Mouth Rinse BID  . nystatin   Topical TID  . potassium chloride  40 mEq Oral Once  . spironolactone  12.5 mg Oral Daily    Infusions: . amiodarone 30 mg/hr (02/24/18 1000)  . heparin 2,250 Units/hr (02/24/18 1000)    PRN Medications: acetaminophen, albuterol, ondansetron (ZOFRAN) IV    Patient  Profile   62 yo with history of apparent chronic atrial fibrillation (had in [place] prior to moving to [facility]) had apparent ventricular fibrillation arrest with 1 shock and was taken to [facility], later transferred to [facility]. She was in atrial fibrillation with RVR after shock.    Assessment/Plan   1. Cardiac arrest: VF per report, no strips available.  Shocked x 1, then in atrial fibrillation.  Mild troponin elevation with no trend.  Given low EF, cannot rule out ischemic event.  - Plan R/L cath tomorrow - Tramadol for pain 2. Atrial fibrillation: Patient moved to [place] recently from [place].  Was seen by Dr. Tomie China in 8/19, was in atrial fibrillation.  Says that she was in atrial fibrillation prior to moving down from [place].  Possibly x several years.  Remembers 1 cardioversion from the past.  - On heparin gtt for now, transition over to Eliquis after cath tomorrow - She remains on amiodarone gtt for rate control, HR in 100s.   - Would favor attempting DCCV while on amiodarone prior to D/C.  Will need TEE prior.  Can arrange for this later in week after tomorrow'scath.  - Home digoxin restarted to help with rate control.  - Will add low-dose Coreg cautiously to try to help with rate control.  She apparently did not tolerate metoprolol well in the past.  3. Acute systolic CHF: Echo with EF 25-30%, diffuse hypokinesis.  No prior echoes, no definite history of CHF.  She does have chronic venous stasis changes on her legs.  Cause of cardiomyopathy uncertain.  Could be tachycardia-mediated.  However, need to rule out coronary disease.  On exam, she is volume overloaded.  Diuresing well on IV lasix  - Continue IV lasix today - Continue low-dose losartan and spironolactone.  - Continue digoxin.   - Add carvedilol 3.125 bid - Plan for RHC/LHC tomorrow, scheduled.   4. Venous stasis changes lower legs: Wound care consult.  5. Hypothyroidism: Low dose Synthroid.  6. Acute hypoxic respiratory failure -  Improving with diuresis - Likely has OSA/OHS. Will need sleep study after d/c 7. Hypokalemia - will supp.  - increase spiro  Can transfer to SDU.   Length of Stay: 3  Arvilla Meres, MD  02/24/2018, 10:22 AM  Advanced Heart Failure Team Pager [phone] (M-F; 7a - 4p)  Please contact [facility] for night-coverage after hours (  4p -7a ) and weekends on [email or web address]

## 2018-02-24 NOTE — Progress Notes (Signed)
ANTICOAGULATION CONSULT NOTE - Follow Up Consult  Pharmacy Consult for Heparin Indication: atrial fibrillation  Allergies  Allergen Reactions  . Metoprolol Tartrate Shortness Of Breath  . Biaxin [Clarithromycin] Other (See Comments)    Sores in mouth  . Contrast Media [Iodinated Diagnostic Agents] Rash    Patient Measurements: Height: 5\' 6"  (167.6 cm) Weight: 288 lb 2.3 oz (130.7 kg) IBW/kg (Calculated) : 59.3 Heparin Dosing Weight: 91.4 kg  Vital Signs: Temp: 98 F (36.7 C) (10/05 2300) Temp Source: Oral (10/05 2300) BP: 117/86 (10/06 0300) Pulse Rate: 55 (10/06 0300)  Labs: Recent Labs    02/21/18 2315 02/22/18 0530  02/22/18 1032  02/23/18 0502 02/23/18 1117 02/24/18 0234  HGB 14.2 13.4  --   --   --  14.2  --  14.0  HCT 46.3* 43.3  --   --   --  45.5  --  44.9  PLT 257 250  --   --   --  227  --  222  HEPARINUNFRC  --   --    < >  --    < > 0.34 0.31 0.12*  CREATININE 1.13* 1.09*  --   --   --   --   --   --   TROPONINI 0.46* 0.35*  --  0.35*  --   --   --   --    < > = values in this interval not displayed.    Estimated Creatinine Clearance: 74.3 mL/min (A) (by C-G formula based on SCr of 1.09 mg/dL (H)).   [name] on IV heparin for Afib following VF arrest. Pt was not on anticoagulation PTA but has been on warfarin in the past.    Heparin level down to subherapeutic (0.12) on heparin 1950 units/hr. No issues with infusion or bleeding per RN.  Goal of Therapy:  Heparin level 0.3-0.7 units/ml Monitor platelets by anticoagulation protocol: Yes   Plan:  - Increase heparin drip to 2250 units/hr - Will f/u 6hr heparin level  Christoper Fabian, PharmD, BCPS Clinical pharmacist  **Pharmacist phone directory can now be found on [email or web address] ([email or web address]).  Listed under [facility]. 02/24/2018 4:07 AM

## 2018-02-24 NOTE — Progress Notes (Signed)
Patient ID: Carrie Walters, female   DOB: 07/07/1955, 62 y.o.   MRN: [id number]     Advanced Heart Failure Rounding Note  PCP-Cardiologist: No primary care provider on file.   Subjective:    Breathing better. Sats in high 90s on [place] 6L. Chest still sore from chest compressions.  She remains in atrial fibrillation, rate in 100-105s on amiodarone gtt.   Diuresing briskly on IV lasix. Weigh down 9 pounds overnight. SBP stable.  Echo: Moderately dilated LV with EF 25-30%.     Objective:   Weight Range: 127 kg Body mass index is 45.19 kg/m.   Vital Signs:   Temp:  [97.9 F (36.6 C)-98.4 F (36.9 C)] 98 F (36.7 C) (10/06 0722) Pulse Rate:  [27-106] 92 (10/06 1000) Resp:  [18-31] 22 (10/06 1000) BP: (85-126)/(63-90) 107/81 (10/06 1000) SpO2:  [94 %-99 %] 98 % (10/06 1000) Weight:  [127 kg] 127 kg (10/06 0600) Last BM Date: 02/20/18  Weight change: Filed Weights   02/21/18 2200 02/23/18 1000 02/24/18 0600  Weight: 131.5 kg 130.7 kg 127 kg    Intake/Output:   Intake/Output Summary (Last 24 hours) at 02/24/2018 1022 Last data filed at 02/24/2018 1000 Gross per 24 hour  Intake 1335.06 ml  Output 6650 ml  Net -5314.94 ml      Physical Exam    General:  Obese woman sitting up in bed. No resp difficulty HEENT: normal Neck: supple. JVP to jaw. Carotids 2+ bilat; no bruits. No lymphadenopathy or thryomegaly appreciated. Cor: PMI nondisplaced. Irregular rate & rhythm. 2/6 TR Lungs: clear Abdomen: obese soft, nontender, nondistended. No hepatosplenomegaly. No bruits or masses. Good bowel sounds. Extremities: no cyanosis, clubbing, rash, 1-2+ edema with venous stasis changes ad small wounds  Neuro: alert & orientedx3, cranial nerves grossly intact. moves all 4 extremities w/o difficulty. Affect pleasant   Telemetry   Atrial fibrillation rate 100-105s (personally reviewed).   Labs    CBC Recent Labs    02/21/18 2315  02/23/18 0502 02/24/18 0234 02/24/18 0757  WBC  13.8*   < > 10.0 10.8*  --   NEUTROABS 11.9*  --   --   --   --   HGB 14.2   < > 14.2 14.0 13.9  HCT 46.3*   < > 45.5 44.9 41.0  MCV 90.3   < > 89.4 89.3  --   PLT 257   < > 227 222  --    < > = values in this interval not displayed.   Basic Metabolic Panel Recent Labs    40/98/11 2315 02/22/18 0530 02/24/18 0757 02/24/18 0809  NA 138 137 137 135  K 4.2 4.2 3.0* 3.0*  CL 105 105  --  90*  CO2 21* 21*  --  34*  GLUCOSE 171* 145* 131* 133*  BUN 14 15  --  23  CREATININE 1.13* 1.09*  --  1.04*  CALCIUM 9.1 8.5*  --  9.1  MG 1.9  --   --   --    Liver Function Tests Recent Labs    02/21/18 2315  AST 37  ALT 23  ALKPHOS 87  BILITOT 1.1  PROT 7.1  ALBUMIN 3.6   No results for input(s): LIPASE, AMYLASE in the last 72 hours. Cardiac Enzymes Recent Labs    02/21/18 2315 02/22/18 0530 02/22/18 1032  TROPONINI 0.46* 0.35* 0.35*    BNP: BNP (last 3 results) Recent Labs    02/21/18 2315  BNP 334.5*  ProBNP (last 3 results) No results for input(s): PROBNP in the last 8760 hours.   D-Dimer No results for input(s): DDIMER in the last 72 hours. Hemoglobin A1C No results for input(s): HGBA1C in the last 72 hours. Fasting Lipid Panel No results for input(s): CHOL, HDL, LDLCALC, TRIG, CHOLHDL, LDLDIRECT in the last 72 hours. Thyroid Function Tests Recent Labs    02/21/18 2315  TSH 9.842*    Other results:   Imaging    No results found.   Medications:     Scheduled Medications: . digoxin  0.125 mg Oral Daily  . furosemide  80 mg Intravenous BID  . levothyroxine  25 mcg Oral QAC breakfast  . losartan  12.5 mg Oral Daily  . mouth rinse  15 mL Mouth Rinse BID  . nystatin   Topical TID  . potassium chloride  40 mEq Oral Once  . spironolactone  12.5 mg Oral Daily    Infusions: . amiodarone 30 mg/hr (02/24/18 1000)  . heparin 2,250 Units/hr (02/24/18 1000)    PRN Medications: acetaminophen, albuterol, ondansetron (ZOFRAN) IV    Patient  Profile   62 yo with history of apparent chronic atrial fibrillation (had in [place] prior to moving to [facility]) had apparent ventricular fibrillation arrest with 1 shock and was taken to [facility], later transferred to [facility]. She was in atrial fibrillation with RVR after shock.    Assessment/Plan   1. Cardiac arrest: VF per report, no strips available.  Shocked x 1, then in atrial fibrillation.  Mild troponin elevation with no trend.  Given low EF, cannot rule out ischemic event.  - Plan R/L cath tomorrow - Tramadol for pain 2. Atrial fibrillation: Patient moved to [place] recently from [place].  Was seen by Dr. Tomie China in 8/19, was in atrial fibrillation.  Says that she was in atrial fibrillation prior to moving down from [place].  Possibly x several years.  Remembers 1 cardioversion from the past.  - On heparin gtt for now, transition over to Eliquis after cath tomorrow - She remains on amiodarone gtt for rate control, HR in 100s.   - Would favor attempting DCCV while on amiodarone prior to D/C.  Will need TEE prior.  Can arrange for this later in week after tomorrow'scath.  - Home digoxin restarted to help with rate control.  - Will add low-dose Coreg cautiously to try to help with rate control.  She apparently did not tolerate metoprolol well in the past.  3. Acute systolic CHF: Echo with EF 25-30%, diffuse hypokinesis.  No prior echoes, no definite history of CHF.  She does have chronic venous stasis changes on her legs.  Cause of cardiomyopathy uncertain.  Could be tachycardia-mediated.  However, need to rule out coronary disease.  On exam, she is volume overloaded.  Diuresing well on IV lasix  - Continue IV lasix today - Continue low-dose losartan and spironolactone.  - Continue digoxin.   - Add carvedilol 3.125 bid - Plan for RHC/LHC tomorrow, scheduled.   4. Venous stasis changes lower legs: Wound care consult.  5. Hypothyroidism: Low dose Synthroid.  6. Acute hypoxic respiratory failure -  Improving with diuresis - Likely has OSA/OHS. Will need sleep study after d/c 7. Hypokalemia - will supp.  - increase spiro  Can transfer to SDU.   Length of Stay: 3  Arvilla Meres, MD  02/24/2018, 10:22 AM  Advanced Heart Failure Team Pager [phone] (M-F; 7a - 4p)  Please contact [facility] for night-coverage after hours (  4p -7a ) and weekends on [email or web address]

## 2018-02-24 NOTE — Progress Notes (Addendum)
ANTICOAGULATION CONSULT NOTE - Follow Up Consult  Pharmacy Consult for Heparin Indication: atrial fibrillation  Allergies  Allergen Reactions  . Metoprolol Tartrate Shortness Of Breath  . Biaxin [Clarithromycin] Other (See Comments)    Sores in mouth  . Contrast Media [Iodinated Diagnostic Agents] Rash    Patient Measurements: Height: 5\' 6"  (167.6 cm) Weight: 279 lb 15.8 oz (127 kg) IBW/kg (Calculated) : 59.3 Heparin Dosing Weight: 91.4 kg  Vital Signs: Temp: 97.8 F (36.6 C) (10/06 1125) Temp Source: Oral (10/06 1125) BP: 107/81 (10/06 1000) Pulse Rate: 92 (10/06 1000)  Labs: Recent Labs    02/21/18 2315 02/22/18 0530  02/22/18 1032  02/23/18 0502 02/23/18 1117 02/24/18 0234 02/24/18 0757 02/24/18 0809 02/24/18 1038  HGB 14.2 13.4  --   --   --  14.2  --  14.0 13.9  --   --   HCT 46.3* 43.3  --   --   --  45.5  --  44.9 41.0  --   --   PLT 257 250  --   --   --  227  --  222  --   --   --   HEPARINUNFRC  --   --    < >  --    < > 0.34 0.31 0.12*  --   --  0.22*  CREATININE 1.13* 1.09*  --   --   --   --   --   --   --  1.04*  --   TROPONINI 0.46* 0.35*  --  0.35*  --   --   --   --   --   --   --    < > = values in this interval not displayed.    Estimated Creatinine Clearance: 76.5 mL/min (A) (by C-G formula based on SCr of 1.04 mg/dL (H)).   [name] on IV heparin for Afib following VF arrest. Pt was not on anticoagulation PTA but has been on warfarin in the past.    Heparin level remains subherapeutic (0.22) on heparin 2250 units/hr. Hb, plt stable. No issues reported with infusion or bleeding per RN.  PM heparin level therapeutic on heparin at 2450 units / hr  Goal of Therapy:  Heparin level 0.3-0.7 units/ml Monitor platelets by anticoagulation protocol: Yes   Plan:  - Increase heparin drip to 2450 units/hr - Check anti-Xa level in 6 hours and daily while on heparin - Continue to monitor H&H and platelets   Marcelino Freestone,  PharmD PGY2 Cardiology Pharmacy Resident Phone [phone] Please check AMION for all Pharmacist numbers by unit 02/24/2018 12:29 PM   19:43Pm: Continue heparin at 2450 units / hr Thank you Okey Regal, PharmD

## 2018-02-25 ENCOUNTER — Encounter (HOSPITAL_COMMUNITY): Payer: Self-pay | Admitting: Cardiovascular Disease

## 2018-02-25 ENCOUNTER — Inpatient Hospital Stay (HOSPITAL_COMMUNITY)
Admission: AD | Disposition: A | Discharge status: Home Health | Payer: Self-pay | Source: Other Acute Inpatient Hospital | Attending: Cardiology

## 2018-02-25 ENCOUNTER — Ambulatory Visit: Payer: Medicaid Other | Admitting: Cardiology

## 2018-02-25 ENCOUNTER — Encounter (HOSPITAL_COMMUNITY)
Admission: AD | Disposition: A | Discharge status: Home Health | Payer: Self-pay | Source: Other Acute Inpatient Hospital | Attending: Cardiology

## 2018-02-25 DIAGNOSIS — I469 Cardiac arrest, cause unspecified: Secondary | ICD-10-CM

## 2018-02-25 DIAGNOSIS — I5021 Acute systolic (congestive) heart failure: Secondary | ICD-10-CM

## 2018-02-25 HISTORY — PX: LEFT HEART CATH AND CORONARY ANGIOGRAPHY: CATH118249

## 2018-02-25 LAB — CBC
HCT: 46 % (ref 36.0–46.0)
Hemoglobin: 14.2 g/dL (ref 12.0–15.0)
MCH: 27.6 pg (ref 26.0–34.0)
MCHC: 30.9 g/dL (ref 30.0–36.0)
MCV: 89.3 fL (ref 78.0–100.0)
PLATELETS: 231 10*3/uL (ref 150–400)
RBC: 5.15 MIL/uL — ABNORMAL HIGH (ref 3.87–5.11)
RDW: 17.2 % — ABNORMAL HIGH (ref 11.5–15.5)
WBC: 9.8 10*3/uL (ref 4.0–10.5)

## 2018-02-25 LAB — BASIC METABOLIC PANEL
Anion gap: 8 (ref 5–15)
BUN: 21 mg/dL (ref 8–23)
CALCIUM: 9.1 mg/dL (ref 8.9–10.3)
CO2: 34 mmol/L — ABNORMAL HIGH (ref 22–32)
CREATININE: 0.83 mg/dL (ref 0.44–1.00)
Chloride: 92 mmol/L — ABNORMAL LOW (ref 98–111)
GFR calc Af Amer: >60 mL/min (ref >60)
Glucose, Bld: 127 mg/dL — ABNORMAL HIGH (ref 70–99)
Potassium: 3.9 mmol/L (ref 3.5–5.1)
Sodium: 134 mmol/L — ABNORMAL LOW (ref 135–145)

## 2018-02-25 LAB — HEPARIN LEVEL (UNFRACTIONATED): HEPARIN UNFRACTIONATED: 0.3 [IU]/mL (ref 0.30–0.70)

## 2018-02-25 SURGERY — INVASIVE LAB ABORTED CASE
Anesthesia: LOCAL

## 2018-02-25 SURGERY — LEFT HEART CATH AND CORONARY ANGIOGRAPHY
Anesthesia: LOCAL

## 2018-02-25 MED ORDER — IOHEXOL 350 MG/ML SOLN
INTRAVENOUS | Status: DC | PRN
  Administered 2018-02-25: 30 mL via INTRA_ARTERIAL

## 2018-02-25 MED ORDER — HEPARIN (PORCINE) IN NACL 1000-0.9 UT/500ML-% IV SOLN
INTRAVENOUS | Status: DC | PRN
  Administered 2018-02-25 (×2): 500 mL

## 2018-02-25 MED ORDER — VERAPAMIL HCL 2.5 MG/ML IV SOLN
INTRAVENOUS | Status: AC
  Filled 2018-02-25: qty 2

## 2018-02-25 MED ORDER — SODIUM CHLORIDE 0.9 % IV SOLN: 250.0000 mL | INTRAVENOUS | Status: DC | PRN

## 2018-02-25 MED ORDER — HEPARIN SODIUM (PORCINE) 1000 UNIT/ML IJ SOLN
INTRAMUSCULAR | Status: DC | PRN
  Administered 2018-02-25: 6000 [IU] via INTRAVENOUS

## 2018-02-25 MED ORDER — AMIODARONE HCL 200 MG PO TABS
400.0000 mg | ORAL_TABLET | Freq: Two times a day (BID) | ORAL | Status: DC
  Administered 2018-02-25 – 2018-02-28 (×7): 400 mg via ORAL
  Filled 2018-02-25 (×7): qty 2

## 2018-02-25 MED ORDER — LIDOCAINE HCL (PF) 1 % IJ SOLN
INTRAMUSCULAR | Status: DC | PRN
  Administered 2018-02-25 (×2): 2 mL via SUBCUTANEOUS

## 2018-02-25 MED ORDER — SODIUM CHLORIDE 0.9% FLUSH
3.0000 mL | Freq: Two times a day (BID) | INTRAVENOUS | Status: DC
  Administered 2018-02-25 – 2018-03-01 (×8): 3 mL via INTRAVENOUS

## 2018-02-25 MED ORDER — DIPHENHYDRAMINE HCL 50 MG/ML IJ SOLN: 25.0000 mg | Freq: Once | INTRAMUSCULAR | Status: DC

## 2018-02-25 MED ORDER — SODIUM CHLORIDE 0.9 % IV SOLN
INTRAVENOUS | Status: AC
  Administered 2018-02-25: 17:00:00 via INTRAVENOUS

## 2018-02-25 MED ORDER — MORPHINE SULFATE (PF) 2 MG/ML IV SOLN
2.0000 mg | INTRAVENOUS | Status: DC | PRN
  Filled 2018-02-25: qty 1

## 2018-02-25 MED ORDER — SODIUM CHLORIDE 0.9% FLUSH: 3.0000 mL | Freq: Two times a day (BID) | INTRAVENOUS | Status: DC

## 2018-02-25 MED ORDER — LIDOCAINE HCL (PF) 1 % IJ SOLN
INTRAMUSCULAR | Status: AC
  Filled 2018-02-25: qty 30

## 2018-02-25 MED ORDER — NITROGLYCERIN 1 MG/10 ML FOR IR/CATH LAB
INTRA_ARTERIAL | Status: AC
  Filled 2018-02-25: qty 10

## 2018-02-25 MED ORDER — ONDANSETRON HCL 4 MG/2ML IJ SOLN: 4.0000 mg | Freq: Four times a day (QID) | INTRAMUSCULAR | Status: DC | PRN

## 2018-02-25 MED ORDER — ASPIRIN 81 MG PO CHEW: 81.0000 mg | CHEWABLE_TABLET | Freq: Every day | ORAL | Status: DC

## 2018-02-25 MED ORDER — SODIUM CHLORIDE 0.9 % IV SOLN
INTRAVENOUS | Status: DC
  Administered 2018-02-25: 09:00:00 via INTRAVENOUS

## 2018-02-25 MED ORDER — ACETAMINOPHEN 325 MG PO TABS: 650.0000 mg | ORAL_TABLET | ORAL | Status: DC | PRN

## 2018-02-25 MED ORDER — SODIUM CHLORIDE 0.9% FLUSH: 3.0000 mL | INTRAVENOUS | Status: DC | PRN

## 2018-02-25 MED ORDER — METHYLPREDNISOLONE SODIUM SUCC 125 MG IJ SOLR
40.0000 mg | INTRAMUSCULAR | Status: DC
  Administered 2018-02-25 (×2): 40 mg via INTRAVENOUS
  Filled 2018-02-25 (×2): qty 2

## 2018-02-25 MED ORDER — ASPIRIN 81 MG PO CHEW
81.0000 mg | CHEWABLE_TABLET | ORAL | Status: AC
  Administered 2018-02-25: 81 mg via ORAL
  Filled 2018-02-25: qty 1

## 2018-02-25 MED ORDER — HEPARIN (PORCINE) IN NACL 100-0.45 UNIT/ML-% IJ SOLN
2500.0000 [IU]/h | INTRAMUSCULAR | Status: DC
  Administered 2018-02-25 – 2018-02-26 (×2): 2500 [IU]/h via INTRAVENOUS
  Filled 2018-02-25: qty 250

## 2018-02-25 MED ORDER — MORPHINE SULFATE (PF) 10 MG/ML IV SOLN: 2.0000 mg | INTRAVENOUS | Status: DC | PRN

## 2018-02-25 MED ORDER — HEPARIN (PORCINE) IN NACL 1000-0.9 UT/500ML-% IV SOLN
INTRAVENOUS | Status: AC
  Filled 2018-02-25: qty 1000

## 2018-02-25 SURGICAL SUPPLY — 13 items
CATH INFINITI 5FR ANG PIGTAIL (CATHETERS) ×2 IMPLANT
CATH OPTITORQUE TIG 4.0 5F (CATHETERS) ×2 IMPLANT
DEVICE RAD COMP TR BAND LRG (VASCULAR PRODUCTS) ×2 IMPLANT
ELECT DEFIB PAD ADLT CADENCE (PAD) ×2 IMPLANT
GLIDESHEATH SLEND A-KIT 6F 22G (SHEATH) ×2 IMPLANT
GUIDEWIRE INQWIRE 1.5J.035X260 (WIRE) ×1 IMPLANT
INQWIRE 1.5J .035X260CM (WIRE) ×2
KIT HEART LEFT (KITS) ×2 IMPLANT
PACK CARDIAC CATHETERIZATION (CUSTOM PROCEDURE TRAY) ×2 IMPLANT
SHEATH GLIDE SLENDER 4/5FR (SHEATH) ×2 IMPLANT
TRANSDUCER W/STOPCOCK (MISCELLANEOUS) ×2 IMPLANT
TUBING CIL FLEX 10 FLL-RA (TUBING) ×2 IMPLANT
WIRE HI TORQ VERSACORE-J 145CM (WIRE) ×2 IMPLANT

## 2018-02-25 NOTE — Progress Notes (Signed)
ANTICOAGULATION CONSULT NOTE - Follow Up Consult  Pharmacy Consult for Heparin Indication: atrial fibrillation  Allergies  Allergen Reactions  . Metoprolol Tartrate Shortness Of Breath  . Biaxin [Clarithromycin] Other (See Comments)    Sores in mouth  . Contrast Media [Iodinated Diagnostic Agents] Rash    Patient Measurements: Height: 5\' 6"  (167.6 cm) Weight: 274 lb 7.6 oz (124.5 kg) IBW/kg (Calculated) : 59.3 Heparin Dosing Weight: 91.4 kg  Vital Signs: Temp: 97.9 F (36.6 C) (10/07 1639) Temp Source: Oral (10/07 1639) BP: 97/81 (10/07 1830) Pulse Rate: 81 (10/07 1845)  Labs: Recent Labs    02/23/18 0502  02/24/18 0234 02/24/18 0757 02/24/18 0809 02/24/18 1038 02/24/18 1841 02/25/18 0244 02/25/18 0809  HGB 14.2  --  14.0 13.9  --   --   --  14.2  --   HCT 45.5  --  44.9 41.0  --   --   --  46.0  --   PLT 227  --  222  --   --   --   --  231  --   HEPARINUNFRC 0.34   < > 0.12*  --   --  0.22* 0.37 0.30  --   CREATININE  --   --   --   --  1.04*  --   --   --  0.83   < > = values in this interval not displayed.    Estimated Creatinine Clearance: 94.7 mL/min (by C-G formula based on SCr of 0.83 mg/dL).   [name] on IV heparin for Afib following VF arrest. Pt was not on anticoagulation PTA but has been on warfarin in the past.    Heparin level at goal prior to cath this afternoon. D/w Dr. Doyle Askew - will restart heparin for her afib, coronaries were clear.   Goal of Therapy:  Heparin level 0.3-0.7 units/ml Monitor platelets by anticoagulation protocol: Yes   Plan:  - restart heparin at 2500 units/hr - Check anti-Xa level in 6 hours and daily while on heparin - Continue to monitor H&H and platelets  Sheppard Coil PharmD., BCPS Clinical Pharmacist 02/25/2018 6:55 PM

## 2018-02-25 NOTE — Care Management Note (Addendum)
Case Management Note  Patient Details  Name: Carrie Walters MRN: [id number] Date of Birth: 26-Feb-1956  Subjective/Objective:  62 y.o. female presented  From OSH after a V-fib arrest. Patient has LHC/RHC scheduled on 02/25/18.                   Action/Plan: CM consult for [facility] and medication needs acknowledged. Patient reports living at home with her daughter and a "housemate", being independent with ADLs, and occasionally ambulating with a cane PTA. Patient confirmed her PCP as: Carrie Walters; pharmacy of choice: [facility], [place]. Patient has Medicaid with Rx $3-$4, verbalized to CM her ability to afford her Rx, but has been unable to pickup her Rx d/t her lack of transportation. Patient was agreeable to CM assisting with Rx needs for pharmacies that deliver. CM spoke to a pharmacist at [facility] and was informed a mail delivery service is available to assist with medication adherence, but CM would need to contact Head Pharmacist Sonia Side on 02/26/18 once he return to discuss delivery service options at [phone]. CM discuss HH needs, with patient open to [facility] for [facility] services for wound care and is agreeable to continue post transition. CM requested MD consult PT/OT to eval for additional needs; patient will need [place] orders and F2F for [facility] arrangement. Patient indicated her daughter would be available to provide assistance at home; patient stated she may need transportation home if her friends are unable to provide. CM will continue to follow and assist with dispositional needs.    Expected Discharge Date:                  Expected Discharge Plan:  [place]  In-House Referral:  NA  Discharge planning Services  CM Consult  Post Acute Care Choice:  Home Health Choice offered to:  Patient  DME Arranged:  N/A DME Agency:  NA  HH Arranged:  RN, Disease Management, PT [place] Agency:  [place]  Status of Service:  In process, will continue to  follow  If discussed at Long Length of Stay Meetings, dates discussed:    Additional Comments: 02/26/18 @ 1053-[name] RNCM-CM consult acknowledged for [employer]. Patient has [place] Medicaid with Entresto the preferred drug but clinical criteria apply prior to approval. CM placed the criteria on patient's shadow chart. CM contacted [employer] and spoke to [place], [employer]. Per the pharmacist, all medications could be mailed to patient to help with adherence except any narcotics, patient must have a credit card on file and attending PCP f/u appointments as scheduled; CM updated patient on what's required to arrange [employer] order, with patient verbalizing understanding. CM discussed transportation barrier, with patient stating she's aware of Medicaid transportation and will f/u post transition to schedule transportation to her f/u appointments. [place] referral given to [facility] liaison for HHRN/PT. [facility] [place] met with patient today in her room to discuss barriers and will assist with adherence. CM will continue to follow.   Midge Minium RN, BSN, NCM-BC, ACM-RN [phone] 02/25/2018, 12:22 PM

## 2018-02-25 NOTE — Plan of Care (Signed)
To cath lab via bed, oxygen and monitor. RN with pt.

## 2018-02-25 NOTE — Interval H&P Note (Signed)
Cath Lab Visit (complete for each Cath Lab visit)  Clinical Evaluation Leading to the Procedure:   ACS: Yes.    Non-ACS:    Anginal Classification: CCS III  Anti-ischemic medical therapy: No Therapy  Non-Invasive Test Results: No non-invasive testing performed  Prior CABG: No previous CABG      History and Physical Interval Note:  02/25/2018 3:08 PM  Carrie Walters  has presented today for surgery, with the diagnosis of unstable angina  The various methods of treatment have been discussed with the patient and family. After consideration of risks, benefits and other options for treatment, the patient has consented to  Procedure(s): RIGHT/LEFT HEART CATH AND CORONARY ANGIOGRAPHY (N/A) as a surgical intervention .  The patient's history has been reviewed, patient examined, no change in status, stable for surgery.  I have reviewed the patient's chart and labs.  Questions were answered to the patient's satisfaction.     [name]

## 2018-02-25 NOTE — Progress Notes (Signed)
ANTICOAGULATION CONSULT NOTE - Follow Up Consult  Pharmacy Consult for Heparin Indication: atrial fibrillation  Allergies  Allergen Reactions  . Metoprolol Tartrate Shortness Of Breath  . Biaxin [Clarithromycin] Other (See Comments)    Sores in mouth  . Contrast Media [Iodinated Diagnostic Agents] Rash    Patient Measurements: Height: 5\' 6"  (167.6 cm) Weight: 274 lb 7.6 oz (124.5 kg) IBW/kg (Calculated) : 59.3 Heparin Dosing Weight: 89.2 kg  Vital Signs: Temp: 97.8 F (36.6 C) (10/07 0700) Temp Source: Oral (10/07 0700) BP: 124/74 (10/07 0700) Pulse Rate: 90 (10/07 0700)  Labs: Recent Labs    02/22/18 1032  02/23/18 0502  02/24/18 0234 02/24/18 0757 02/24/18 0809 02/24/18 1038 02/24/18 1841 02/25/18 0244 02/25/18 0809  HGB  --    < > 14.2  --  14.0 13.9  --   --   --  14.2  --   HCT  --    < > 45.5  --  44.9 41.0  --   --   --  46.0  --   PLT  --   --  227  --  222  --   --   --   --  231  --   HEPARINUNFRC  --    < > 0.34   < > 0.12*  --   --  0.22* 0.37 0.30  --   CREATININE  --   --   --   --   --   --  1.04*  --   --   --  0.83  TROPONINI 0.35*  --   --   --   --   --   --   --   --   --   --    < > = values in this interval not displayed.    Estimated Creatinine Clearance: 94.7 mL/min (by C-G formula based on SCr of 0.83 mg/dL).   [name] on heparin for atrial fibrillation. Pt was not on anticoagulation PTA. HL (0.30) therapeutic on 2450 units/h. CBC stable and WNL. No signs of bleeding or issues with infusion per nursing. Will continue current rate and monitor for bleeding. Will increase heparin slightly since she has continued to be subtherapeutic and is on the lower end of therapeutic this AM.  Goal of Therapy:  Heparin level 0.3-0.7 units/ml Monitor platelets by anticoagulation protocol: Yes   Plan:  - Increase heparin gtt to 2500 units/hr - Check anti-Xa level daily - Continue to monitor H&H, PLTs, and signs of bleeding  Tama Headings, PharmD Candidate 02/25/2018,9:07 AM

## 2018-02-25 NOTE — Progress Notes (Signed)
Progress Note  Patient Name: Carrie Walters Date of Encounter: 02/25/2018  Primary Cardiologist: No primary care provider on file.  Subjective   Patient feeling better today. Denies anginal chest pain or SOB. Continues to endorse chest wall tenderness from CPR. Diuresed well over the weekend with ~7L off.  Inpatient Medications    Scheduled Meds: . amiodarone  400 mg Oral BID  . aspirin  81 mg Oral Pre-Cath  . carvedilol  3.125 mg Oral BID WC  . digoxin  0.125 mg Oral Daily  . furosemide  80 mg Intravenous BID  . levothyroxine  25 mcg Oral QAC breakfast  . losartan  25 mg Oral Daily  . mouth rinse  15 mL Mouth Rinse BID  . potassium chloride  40 mEq Oral BID  . sodium chloride flush  3 mL Intravenous Q12H  . spironolactone  25 mg Oral Daily   Continuous Infusions: . sodium chloride    . sodium chloride    . heparin 2,450 Units/hr (02/25/18 0700)   PRN Meds: sodium chloride, acetaminophen, albuterol, ondansetron (ZOFRAN) IV, sodium chloride flush, traMADol   Vital Signs    Vitals:   02/25/18 0400 02/25/18 0500 02/25/18 0600 02/25/18 0700  BP: 115/76 (!) 120/91 106/76 124/74  Pulse: 92 (!) 40 (!) 25 90  Resp: (!) 21 (!) 22 (!) 21 17  Temp: 98.1 F (36.7 C)   97.8 F (36.6 C)  TempSrc: Oral   Oral  SpO2: 99% 97% 94% 98%  Weight:  124.5 kg    Height:        Intake/Output Summary (Last 24 hours) at 02/25/2018 0817 Last data filed at 02/25/2018 0700 Gross per 24 hour  Intake 1830.65 ml  Output 3575 ml  Net -1744.35 ml   Filed Weights   02/23/18 1000 02/24/18 0600 02/25/18 0500  Weight: 130.7 kg 127 kg 124.5 kg    Telemetry    A. Flutter, Rate 80s-90s - Personally Reviewed  ECG    No ECG today  Physical Exam   GEN: No acute distress. Obese.   Neck: No JVD Cardiac: Irr Irr; no murmurs, rubs, or gallops. Chest wall tender to palpation. Respiratory: Distant 2/2 body habitus. Clear to auscultation. GI: Soft, nontender, non-distended  MS: Bilateral  edema; Venous stasis changes, wounds with clean/dry dressings. 1+ pedal pulses bilaterally, bilateral LE cool to touch. Neuro:  Nonfocal Psych: Normal affect   Labs    Chemistry Recent Labs  Lab 02/21/18 2315 02/22/18 0530 02/24/18 0757 02/24/18 0809  NA 138 137 137 135  K 4.2 4.2 3.0* 3.0*  CL 105 105  --  90*  CO2 21* 21*  --  34*  GLUCOSE 171* 145* 131* 133*  BUN 14 15  --  23  CREATININE 1.13* 1.09*  --  1.04*  CALCIUM 9.1 8.5*  --  9.1  PROT 7.1  --   --   --   ALBUMIN 3.6  --   --   --   AST 37  --   --   --   ALT 23  --   --   --   ALKPHOS 87  --   --   --   BILITOT 1.1  --   --   --   GFRNONAA 51* 53*  --  56*  GFRAA 59* >60  --  >60  ANIONGAP 12 11  --  11     Hematology Recent Labs  Lab 02/23/18 0502 02/24/18 0234 02/24/18 0757 02/25/18  0244  WBC 10.0 10.8*  --  9.8  RBC 5.09 5.03  --  5.15*  HGB 14.2 14.0 13.9 14.2  HCT 45.5 44.9 41.0 46.0  MCV 89.4 89.3  --  89.3  MCH 27.9 27.8  --  27.6  MCHC 31.2 31.2  --  30.9  RDW 17.6* 17.7*  --  17.2*  PLT 227 222  --  231    Cardiac Enzymes Recent Labs  Lab 02/21/18 2315 02/22/18 0530 02/22/18 1032  TROPONINI 0.46* 0.35* 0.35*   No results for input(s): TROPIPOC in the last 168 hours.   BNP Recent Labs  Lab 02/21/18 2315  BNP 334.5*     DDimer No results for input(s): DDIMER in the last 168 hours.   Radiology    No results found.  Cardiac Studies   Limited Echo 10/4 Study Conclusions - Left ventricle: The cavity size was moderately dilated. Wall   thickness was increased in a pattern of mild LVH. Systolic   function was severely reduced. The estimated ejection fraction   was in the range of 25% to 30%. Diffuse hypokinesis. Impressions: - Limited echo. A-fib is noted. LVEF 25-30%, mild LVH, moderately   dilated LV, LV mid-ventricle false tendon, severe global   hypokinesis.  LHC/RHC today   Patient Profile     63 y.o. female presented from [facility] after apparent  V-fib arrest, ROSC achieved after single defibrillation shock. Rhythm A.Fib with RVR following this event. Echo has shown Acute Systolic HF with EF 25-30%, etiology unclear, Ischemic evaluation ongoing.  Assessment & Plan    1) A. Fib/Flutter: Patient presented in A.fib on Amiodarine gtt after being in RVR following apparent V-Fib arrest at OSH. Amio continued given soft BP and reduced EF on Echo. Patient had apparently been off of coumadin since moving to [place] about a year ago; has multi-year history of A.fib with reported previous DC-CV. Rate controlled today. - Heparin gtt, transition to Warfarin following Cath - Low dose Coreg (did not tolerate metoprolol in the past) - Continue home Digoxin - Transition Amio to PO, 400mg  BID - Possible DC-CV this admission  2) Acute HFrEF: Echo w/ EF 25-30%, Diffuse hypokinesis. No previous Echo nor known CHF Hx. Etiology unclear; may be related to tachycardia vs ischemic disease. She has been Diuresing well on IV lasix.  - Continue IV Lasix 80mg  BID - Continue Losartan 25mg  Daily - Continue Spironolactone 25mg  Daily - Continue Digoxin 0.125mg  Daily - Continue Carvedilol 3.125 BID - To go for LHC/RHC today    3) Cardiac arrest: No rhythm strops available from event. Reported V. Fib arrest. Unclear etiology. Has A.fib off of anticoagulation for some time.  Given reduced EF on Echo, ischemic workup ongoing. Troponin this admission flat 0.46>0.35>0.35. - Cath today - Tramadol for chest wall pain  4) Hypothyroidism: TSH 9.8; Synthroid Daily has been started. 5) Venous Stasis changes: Continue with wound care 6) Acute Hypoxic Respiratory Failure: Improving with diuresis. Likely has a component of OHS/OSA as well. Will need sleep study. 7) Hypokalemia: BMP today, will replete PRN   For questions or updates, please contact [facility] Please consult [email or web address] for contact info under      Signed, Beola Cord, MD  02/25/2018, 8:17 AM

## 2018-02-25 NOTE — Plan of Care (Signed)
Received from cath lab.

## 2018-02-26 DIAGNOSIS — I428 Other cardiomyopathies: Secondary | ICD-10-CM

## 2018-02-26 LAB — CBC
HCT: 48.2 % — ABNORMAL HIGH (ref 36.0–46.0)
Hemoglobin: 15 g/dL (ref 12.0–15.0)
MCH: 27.8 pg (ref 26.0–34.0)
MCHC: 31.1 g/dL (ref 30.0–36.0)
MCV: 89.3 fL (ref 80.0–100.0)
PLATELETS: 275 10*3/uL (ref 150–400)
RBC: 5.4 MIL/uL — AB (ref 3.87–5.11)
RDW: 17.5 % — AB (ref 11.5–15.5)
WBC: 10.5 10*3/uL (ref 4.0–10.5)

## 2018-02-26 LAB — BASIC METABOLIC PANEL
Anion gap: 14 (ref 5–15)
BUN: 24 mg/dL — AB (ref 8–23)
CALCIUM: 9.7 mg/dL (ref 8.9–10.3)
CO2: 33 mmol/L — ABNORMAL HIGH (ref 22–32)
Chloride: 91 mmol/L — ABNORMAL LOW (ref 98–111)
Creatinine, Ser: 0.89 mg/dL (ref 0.44–1.00)
GFR calc Af Amer: >60 mL/min (ref >60)
GLUCOSE: 146 mg/dL — AB (ref 70–99)
Potassium: 4.5 mmol/L (ref 3.5–5.1)
SODIUM: 138 mmol/L (ref 135–145)

## 2018-02-26 LAB — HEPARIN LEVEL (UNFRACTIONATED): HEPARIN UNFRACTIONATED: 0.45 [IU]/mL (ref 0.30–0.70)

## 2018-02-26 MED ORDER — APIXABAN 5 MG PO TABS
5.0000 mg | ORAL_TABLET | Freq: Two times a day (BID) | ORAL | Status: DC
  Administered 2018-02-26: 5 mg via ORAL
  Filled 2018-02-26: qty 1

## 2018-02-26 MED ORDER — SORBITOL 70 % SOLN
30.0000 mL | Freq: Every day | Status: DC | PRN
  Administered 2018-02-26: 30 mL via ORAL
  Filled 2018-02-26: qty 30

## 2018-02-26 MED ORDER — SACUBITRIL-VALSARTAN 24-26 MG PO TABS
1.0000 | ORAL_TABLET | Freq: Two times a day (BID) | ORAL | Status: DC
  Administered 2018-02-26 – 2018-03-01 (×6): 1 via ORAL
  Filled 2018-02-26 (×8): qty 1

## 2018-02-26 MED ORDER — PERMETHRIN 1 % EX LOTN
TOPICAL_LOTION | Freq: Once | CUTANEOUS | Status: AC
  Administered 2018-02-26: 16:00:00 via TOPICAL
  Filled 2018-02-26: qty 59

## 2018-02-26 MED ORDER — SENNOSIDES-DOCUSATE SODIUM 8.6-50 MG PO TABS
1.0000 | ORAL_TABLET | Freq: Every day | ORAL | Status: DC | PRN
  Administered 2018-02-26: 1 via ORAL
  Filled 2018-02-26: qty 1

## 2018-02-26 MED ORDER — FUROSEMIDE 40 MG PO TABS
40.0000 mg | ORAL_TABLET | Freq: Every day | ORAL | Status: DC
  Administered 2018-02-27 – 2018-03-01 (×3): 40 mg via ORAL
  Filled 2018-02-26 (×4): qty 1

## 2018-02-26 MED ORDER — CARVEDILOL 6.25 MG PO TABS
6.2500 mg | ORAL_TABLET | Freq: Two times a day (BID) | ORAL | Status: DC
  Administered 2018-02-26 – 2018-03-01 (×6): 6.25 mg via ORAL
  Filled 2018-02-26 (×6): qty 1

## 2018-02-26 MED FILL — Nitroglycerin IV Soln 100 MCG/ML in D5W: INTRA_ARTERIAL | Qty: 10 | Status: AC

## 2018-02-26 MED FILL — Verapamil HCl IV Soln 2.5 MG/ML: INTRAVENOUS | Qty: 2 | Status: AC

## 2018-02-26 NOTE — Progress Notes (Signed)
Assisted patient with getting hair washed and discovered egg-shaped material adhering to roots of hair, unable to removed. Consulted Infection prevention- patient placed on contact precautions for Lice. MD [place] notified and orders obtained.

## 2018-02-26 NOTE — Progress Notes (Addendum)
Pt received to 3e25 from 2H, pt atrial flutter on tele box 10 and verified with telemetry, pt was just treated for lice with treatment, pt alert and oriented, dressing intact to bil le,  Call light in reach, pt eating dinner at this time, pt denies need to contact family as she spoke to them earlier and told them she was moving to regular room, vss other than ra sat 83%, pt did not have oxygen on when nurse arrived to room while she was eating on side of bed, reapplied 02 2l/[place] and sats up to 94%

## 2018-02-26 NOTE — Discharge Instructions (Signed)

## 2018-02-26 NOTE — Progress Notes (Addendum)
Progress Note  Patient Name: Carrie Walters Date of Encounter: 02/26/2018  Primary Cardiologist: Garwin Brothers, MD  Subjective   Patient feeling okay today. She continues to endorse L sided chest wall pain from CPR. Denies shortness of breath.  Inpatient Medications    Scheduled Meds: . amiodarone  400 mg Oral BID  . apixaban  5 mg Oral BID  . carvedilol  6.25 mg Oral BID WC  . digoxin  0.125 mg Oral Daily  . [START ON 02/27/2018] furosemide  40 mg Oral Daily  . levothyroxine  25 mcg Oral QAC breakfast  . mouth rinse  15 mL Mouth Rinse BID  . sacubitril-valsartan  1 tablet Oral BID  . sodium chloride flush  3 mL Intravenous Q12H  . spironolactone  25 mg Oral Daily   Continuous Infusions: . sodium chloride     PRN Meds: sodium chloride, acetaminophen, albuterol, morphine injection, ondansetron (ZOFRAN) IV, senna-docusate, sodium chloride flush, traMADol   Vital Signs    Vitals:   02/26/18 1000 02/26/18 1045 02/26/18 1100 02/26/18 1207  BP: 115/75 123/85 126/76   Pulse: (!) 102 (!) 104 63   Resp: (!) 28 (!) 32 (!) 21   Temp:    (!) 96 F (35.6 C)  TempSrc:    Axillary  SpO2: 95% 96% 93%   Weight:      Height:        Intake/Output Summary (Last 24 hours) at 02/26/2018 1309 Last data filed at 02/26/2018 1100 Gross per 24 hour  Intake 3310.38 ml  Output 3595 ml  Net -284.62 ml   Filed Weights   02/24/18 0600 02/25/18 0500 02/26/18 0640  Weight: 127 kg 124.5 kg 124.7 kg    Telemetry    A. Flutter with variable block, Rate largely controlled with 2 episodes of HR to 120s - Personally Reviewed  ECG    No ECG today  Physical Exam   GEN: No acute distress. Obese.   Neck: No JVD Cardiac: Irr Irr; no murmurs, rubs, or gallops. Chest wall tender to palpation. Respiratory: Distant 2/2 body habitus. Clear to auscultation. GI: Soft, nontender, non-distended  MS: Mild bilateral edema; Venous stasis changes with clean/dry dressings. 1+ pedal pulses bilaterally,  R Radial cath site clean and dry without hematoma. Neuro:  Nonfocal Psych: Normal affect   Labs    Chemistry Recent Labs  Lab 02/21/18 2315  02/24/18 0809 02/25/18 0809 02/26/18 0422  NA 138   < > 135 134* 138  K 4.2   < > 3.0* 3.9 4.5  CL 105   < > 90* 92* 91*  CO2 21*   < > 34* 34* 33*  GLUCOSE 171*   < > 133* 127* 146*  BUN 14   < > 23 21 24*  CREATININE 1.13*   < > 1.04* 0.83 0.89  CALCIUM 9.1   < > 9.1 9.1 9.7  PROT 7.1  --   --   --   --   ALBUMIN 3.6  --   --   --   --   AST 37  --   --   --   --   ALT 23  --   --   --   --   ALKPHOS 87  --   --   --   --   BILITOT 1.1  --   --   --   --   GFRNONAA 51*   < > 56* >60 >60  GFRAA 59*   < > >  60 >60 >60  ANIONGAP 12   < > 11 8 14    < > = values in this interval not displayed.     Hematology Recent Labs  Lab 02/24/18 0234 02/24/18 0757 02/25/18 0244 02/26/18 0422  WBC 10.8*  --  9.8 10.5  RBC 5.03  --  5.15* 5.40*  HGB 14.0 13.9 14.2 15.0  HCT 44.9 41.0 46.0 48.2*  MCV 89.3  --  89.3 89.3  MCH 27.8  --  27.6 27.8  MCHC 31.2  --  30.9 31.1  RDW 17.7*  --  17.2* 17.5*  PLT 222  --  231 275    Cardiac Enzymes Recent Labs  Lab 02/21/18 2315 02/22/18 0530 02/22/18 1032  TROPONINI 0.46* 0.35* 0.35*   No results for input(s): TROPIPOC in the last 168 hours.   BNP Recent Labs  Lab 02/21/18 2315  BNP 334.5*     DDimer No results for input(s): DDIMER in the last 168 hours.   Radiology    No results found.  Cardiac Studies   Limited Echo 10/4 Study Conclusions - Left ventricle: The cavity size was moderately dilated. Wall   thickness was increased in a pattern of mild LVH. Systolic   function was severely reduced. The estimated ejection fraction   was in the range of 25% to 30%. Diffuse hypokinesis. Impressions: - Limited echo. A-fib is noted. LVEF 25-30%, mild LVH, moderately   dilated LV, LV mid-ventricle false tendon, severe global   hypokinesis.  Cath 10/7 - Clean Normal Coronary  Arteries - RHC unable to be performed  Patient Profile     62 y.o. female presented from [facility] after apparent V-fib arrest, ROSC achieved after single defibrillation shock. Rhythm A.Fib with RVR following this event. Echo has shown Acute Systolic HF with EF 25-30%, etiology unclear. Clean coronary arteries.  Assessment & Plan    1) A. Fib/Flutter: Patient presented in A.fib on Amiodarine gtt for in RVR s/p V-Fib arrest at OSH. Now transitioned to PO amio with persistent a.flutter with rate controlled. Two episodes tachycardia to 120s overnight. History of A. Fib for several years. Reportedly of anticoagulation for about the past year, has been on heparin here prior to cath, will transition to DOAC today. Patient reports previous DC-CV. We have asked EP to evaluate for possible DC-CV given systolic dysfunction. - EP consult - Increase Coreg to 6.25 BID - Continue Digoxin 0.125mg  Daily, Amio 400mg  PO BID - Transition from Heparin to Eliquis 5mg  BID  2) Acute HFrEF: Echo w/ EF 25-30%, Diffuse hypokinesis. No previous Echo nor known CHF Hx. Etiology unclear; may be related to tachy arrhythmia. Cath on 10/7 with clean coronaries. Diuresing well on lasix, volume status difficult to judge but seem improved. Given reduced EF and presentation for V-Fib arrest EP has been asked to evaluate patient for AICD placement.  - EP Consult - Continue Spironolactone 25mg  Daily, Digoxin 0.125mg  Daily - Increase Carvedilol to 6.25 BID - Trasition Lasix to 40mg  PO Daily - Switch from Losartan to Entresto 24-26mg  BID   3) Cardiac arrest: No rhythm strips available from event. Reported V. Fib arrest. Unclear etiology. Has A.fib off of anticoagulation for some time. EF reduced on Echo as above. Cath 10/7 with clean coronaries ruling out CAD as etiology. - EP consult - Tramadol for chest wall pain  4) Hypothyroidism: TSH 9.8; Synthroid <MEASUREM<MEASUREMEN T> Daily 5) Venous Stasis changes: Continue with wound care 6)  Acute Hypoxic Respiratory Failure: Improving with diuresis. Likely has a  component of OHS/OSA as well. Will need sleep study.  Dispo: Transfer to telemetry today. PT eval and treat.  For questions or updates, please contact [facility] Please consult [email or web address] for contact info under      Signed, Peter [place], MD  02/26/2018, 1:09 PM    Patient seen and examined and history reviewed. Agree with above findings and plan. Patient feels well today. No SOB. Chest wall pain improved.  Afib/flutter rate controlled on oral meds.  Lungs with few wheezes.  CV IRRR without gallop or murmur Abd soft NT Chronic venous stasis wound left shin without redness or drainage.   Cardiac cath films personally reviewed. No CAD  Impression: 1. Cardiac arrest in setting of Acute CHF. Vib. No CAD. With cardiomyopathy needs to be considered for ICD. Will ask EP to see. 2. Acute systolic CHF. EF 25-30%. Nonischemic. Unclear if Afib playing a role in LV dysfunction. Clinically much improved with diuresis. Will switch lasix to po. Continue optimization of CHF regimen. On Dig, Coreg, aldactone. Switch losartan to Entresto.  3. Afib persistent. Rate now controlled. ? If we should pursue DCCV this admission while on amiodarone or just manage with rate control only. Will await EP input. Can transition to oral DOAC.  4. Hypothyroidism on replacement.  5. Chronic venous stasis 6. Acute hypoxic respiratory failure improved. Consider sleep study as outpatient.    Peter [place], MDFACC 02/26/2018 1:09 PM

## 2018-02-26 NOTE — Evaluation (Signed)
Physical Therapy Evaluation Patient Details Name: Carrie Walters MRN: [id number] DOB: 06-12-1955 Today's Date: 02/26/2018   History of Present Illness  Pt adm from [facility] after Vfib cardiac arrest. Pt with cardiac cath on 10/7 which was clean.  Pt with afib with rvr and acute heart failure. PMH - hypothyroidism, OA bil knees, venous stasis  Clinical Impression  Pt admitted with above diagnosis and presents to PT with functional limitations due to deficits listed below (See PT problem list). Pt needs skilled PT to maximize independence and safety to allow discharge to home with daughter. Expect pt will make good progress toward goals.       Follow Up Recommendations Home health PT    Equipment Recommendations  Other (comment)(bariatric rollator)    Recommendations for Other Services       Precautions / Restrictions Restrictions Weight Bearing Restrictions: No      Mobility  Bed Mobility Overal bed mobility: Needs Assistance Bed Mobility: Supine to Sit     Supine to sit: Min assist     General bed mobility comments: Assist to bring legs off EOB and elevate trunk into sitting  Transfers Overall transfer level: Needs assistance Equipment used: 4-wheeled walker Transfers: Sit to/from Stand Sit to Stand: Min assist         General transfer comment: Assist to bring hips up and for balance  Ambulation/Gait Ambulation/Gait assistance: Min guard Gait Distance (Feet): 90 Feet Assistive device: 4-wheeled walker Gait Pattern/deviations: Step-through pattern;Decreased stride length Gait velocity: decr Gait velocity interpretation: 1.31 - 2.62 ft/sec, indicative of limited community ambulator General Gait Details: Assist for safety and lines. Pt amb on O2 at 3-4L. Difficulty getting a SpO2 reading with a good wave form. After amb and better waveform achieved SpO2 92%. HR to 140's   Stairs            Wheelchair Mobility    Modified Rankin (Stroke Patients  Only)       Balance Overall balance assessment: Needs assistance Sitting-balance support: No upper extremity supported;Feet supported Sitting balance-Leahy Scale: Good     Standing balance support: No upper extremity supported Standing balance-Leahy Scale: Fair                               Pertinent Vitals/Pain Pain Assessment: Faces Faces Pain Scale: Hurts little more Pain Location: lt flank and bil knees Pain Descriptors / Indicators: Sore Pain Intervention(s): Limited activity within patient's tolerance;Monitored during session;Repositioned    Home Living Family/patient expects to be discharged to:: Private residence Living Arrangements: Children;Non-relatives/Friends Available Help at Discharge: Family;Available 24 hours/day Type of Home: Mobile home Home Access: Stairs to enter Entrance Stairs-Rails: Right Entrance Stairs-Number of Steps: 3-4 Home Layout: One level Home Equipment: Cane - single point      Prior Function Level of Independence: Independent with assistive device(s)         Comments: Uses cane at times     Hand Dominance        Extremity/Trunk Assessment   Upper Extremity Assessment Upper Extremity Assessment: Generalized weakness    Lower Extremity Assessment Lower Extremity Assessment: Generalized weakness       Communication   Communication: No difficulties  Cognition Arousal/Alertness: Awake/alert Behavior During Therapy: WFL for tasks assessed/performed Overall Cognitive Status: Within Functional Limits for tasks assessed  General Comments      Exercises     Assessment/Plan    PT Assessment Patient needs continued PT services  PT Problem List Decreased strength;Decreased activity tolerance;Decreased balance;Decreased mobility;Decreased knowledge of use of DME;Obesity       PT Treatment Interventions DME instruction;Gait training;Stair  training;Functional mobility training;Therapeutic activities;Therapeutic exercise;Balance training;Patient/family education    PT Goals (Current goals can be found in the Care Plan section)  Acute Rehab PT Goals Patient Stated Goal: return home PT Goal Formulation: With patient Time For Goal Achievement: 03/12/18 Potential to Achieve Goals: Good    Frequency Min 3X/week   Barriers to discharge Inaccessible home environment stairs to enter    Co-evaluation               AM-PAC PT "6 Clicks" Daily Activity  Outcome Measure Difficulty turning over in bed (including adjusting bedclothes, sheets and blankets)?: Unable Difficulty moving from lying on back to sitting on the side of the bed? : Unable Difficulty sitting down on and standing up from a chair with arms (e.g., wheelchair, bedside commode, etc,.)?: Unable Help needed moving to and from a bed to chair (including a wheelchair)?: A Little Help needed walking in hospital room?: A Little Help needed climbing 3-5 steps with a railing? : A Lot 6 Click Score: 11    End of Session Equipment Utilized During Treatment: Gait belt;Oxygen Activity Tolerance: Treatment limited secondary to medical complications (Comment)(HR to 140's) Patient left: in chair;with call bell/phone within reach Nurse Communication: Mobility status PT Visit Diagnosis: Other abnormalities of gait and mobility (R26.89);Muscle weakness (generalized) (M62.81)    Time: 4259-5638 PT Time Calculation (min) (ACUTE ONLY): 21 min   Charges:   PT Evaluation $PT Eval Moderate Complexity: 1 Mod          [facility] PT Acute Rehabilitation Services Pager [phone] Office [phone]   Angelina Ok [facility] 02/26/2018, 10:32 AM

## 2018-02-26 NOTE — Consult Note (Addendum)
Cardiology Consultation:   Patient ID: Carrie Walters MRN: [id number]; DOB: [birth year]  Admit date: 02/21/2018 Date of Consult: 02/26/2018  Primary Care Provider: [name], MD Primary Cardiologist: Garwin Brothers, MD  Primary Electrophysiologist:  None    Patient Profile:   Carrie Walters is a 62 y.o. female with a hx of AFlutter, hypothyroidism, Asthma/COPD, HTN, morbid obesity, who is being seen today for the evaluation of cardiac arrest, Afib/flutter at the request of Dr. [place].  History of Present Illness:   [name] initially sought attention at [facility].  In revew of her record, indicate pt presented to ED with "heart problems," and while there passed out while in the bathroom. Notes indicate no pulse was present, and so CPR was initiated. Pt received 1mg  IV epi, and pt was found to be in VF on a monitor, and 300J x1 was administered w/ ROSC. Pt was alert and responsive. Notes indicate pt was in afib w/ RVR post-episode. Upon arrival at [facility], pt was in afib w/ RVR, rate low 100s on amiodarone IV gtt. There was no available strips for review of her VF event.  Pt reported she has had afib for the past 4-5 years. She had been on coumadin before, but it was stopped for unclear reasons when she moved to [place] about a year or so ago. She reported she has had hypothyroidism for a long time, and was on synthroid up [place]. When she moved to [place], she was on the synthroid until she ran out and had been off of it for several months now.  Record notes hx of severe hypothyroidism w/ recent TSH noted to be 10.0 at [facility] (per cardiology note from august 2019, and repeat TSH 7.5 drawn 12-27-17.  [facility] labs K+ 4.5 Mag -- BUN/Creat 13/0.90 BNP 2060 WBC 17.3 H/H 15/50 Plts 256  She was admitted to [facility], maintained on amiodarone, heparin gtt, started on synthroid, diuresed, TTE noted LVEF 25-30%, I don't see a LA measurement or description.  Yesterday LHC noted no CAD.  EP is  asked to weigh in on +/- ICD, and AF rate vs rhythm management.  LABS most recent K+ 4.5 BUN/Creat 24/0.89 WBC 10.5 H/H 15/48 Plts 275  02/21/18 TSH 9.842  The patient states she had been to the wound care clinic earlier in the day, while there felt a couple dizzy spells, once home told her boyfriend she was felt faint and had a near syncopal event.  They called 911, she reports EMT told her her HR was >200.  When at the ER, she went to the bathroom, got dizzy again and fainted.   Past Medical History:  Diagnosis Date  . Anxiety   . Arthritis   . Asthma   . Atrial fibrillation (HCC)   . Dysrhythmia   . Hypertension   . Hypothyroidism     Past Surgical History:  Procedure Laterality Date  . CHOLECYSTECTOMY    . LEFT HEART CATH AND CORONARY ANGIOGRAPHY N/A 02/25/2018   Procedure: LEFT HEART CATH AND CORONARY ANGIOGRAPHY;  Surgeon: [name], MD;  Location: [facility] INVASIVE CV LAB;  Service: Cardiovascular;  Laterality: N/A;  . UMBILICAL HERNIA REPAIR       Home Medications:  Prior to Admission medications   Medication Sig Start Date End Date Taking? Authorizing Provider  acetaminophen (TYLENOL) 500 MG tablet Take 1,000 mg by mouth every 6 (six) hours as needed for mild pain.   Yes [provider]  albuterol (PROVENTIL HFA;VENTOLIN HFA)  108 (90 Base) MCG/ACT inhaler Inhale 2 puffs into the lungs every 6 (six) hours as needed for wheezing or shortness of breath.   Yes [provider]  aspirin EC 325 MG tablet Take 1 tablet (325 mg total) by mouth daily. 12/28/17  Yes Revankar, Aundra Dubin, MD  furosemide (LASIX) 40 MG tablet Take 40 mg by mouth daily.   Yes [provider]  metolazone (ZAROXOLYN) 2.5 MG tablet Take 2.5 mg by mouth daily.   Yes [provider]  sulfamethoxazole-trimethoprim (BACTRIM DS,SEPTRA DS) 800-160 MG tablet Take 1 tablet by mouth 2 (two) times daily. Started on 02-07-18 10 day Duration 02/07/18  Yes [provider]    digoxin (LANOXIN) 0.125 MG tablet Take 1 tablet (0.125 mg total) by mouth daily. Patient not taking: Reported on 02/22/2018 12/28/17   Revankar, Aundra Dubin, MD  nebivolol (BYSTOLIC) 10 MG tablet Take 1 tablet (10 mg total) by mouth daily. Patient not taking: Reported on 02/22/2018 01/18/18   Revankar, Aundra Dubin, MD  verapamil (CALAN-SR) 180 MG CR tablet Take 180 mg by mouth 2 (two) times daily. 10/18/17   [provider]    Inpatient Medications: Scheduled Meds: . amiodarone  400 mg Oral BID  . apixaban  5 mg Oral BID  . carvedilol  6.25 mg Oral BID WC  . digoxin  0.125 mg Oral Daily  . [START ON 02/27/2018] furosemide  40 mg Oral Daily  . levothyroxine  25 mcg Oral QAC breakfast  . mouth rinse  15 mL Mouth Rinse BID  . permethrin   Topical Once  . sacubitril-valsartan  1 tablet Oral BID  . sodium chloride flush  3 mL Intravenous Q12H  . spironolactone  25 mg Oral Daily   Continuous Infusions: . sodium chloride     PRN Meds: sodium chloride, acetaminophen, albuterol, morphine injection, ondansetron (ZOFRAN) IV, senna-docusate, sodium chloride flush, sorbitol, traMADol  Allergies:    Allergies  Allergen Reactions  . Metoprolol Tartrate Shortness Of Breath  . Biaxin [Clarithromycin] Other (See Comments)    Sores in mouth  . Contrast Media [Iodinated Diagnostic Agents] Rash    Social History:   Social History   Socioeconomic History  . Marital status: Single    Spouse name: Not on file  . Number of children: Not on file  . Years of education: Not on file  . Highest education level: Not on file  Occupational History  . Not on file  Social Needs  . Financial resource strain: Not on file  . Food insecurity:    Worry: Not on file    Inability: Not on file  . Transportation needs:    Medical: Not on file    Non-medical: Not on file  Tobacco Use  . Smoking status: Former Games developer  . Smokeless tobacco: Never Used  Substance and Sexual Activity  . Alcohol use: Never     Frequency: Never  . Drug use: Never  . Sexual activity: Not on file  Lifestyle  . Physical activity:    Days per week: Not on file    Minutes per session: Not on file  . Stress: Not on file  Relationships  . Social connections:    Talks on phone: Not on file    Gets together: Not on file    Attends religious service: Not on file    Active member of club or organization: Not on file    Attends meetings of clubs or organizations: Not on file  Relationship status: Not on file  . Intimate partner violence:    Fear of current or ex partner: Not on file    Emotionally abused: Not on file    Physically abused: Not on file    Forced sexual activity: Not on file  Other Topics Concern  . Not on file  Social History Narrative   Moved from [place]    Family History:    Family History  Problem Relation Age of Onset  . Heart disease Father   . Atrial fibrillation Brother      ROS:  Please see the history of present illness.  All other ROS reviewed and negative.     Physical Exam/Data:   Vitals:   02/26/18 1100 02/26/18 1207 02/26/18 1300 02/26/18 1400  BP: 126/76  107/63 125/84  Pulse: 63  (!) 37 (!) 39  Resp: (!) 21  (!) 25 (!) 29  Temp:  (!) 96 F (35.6 C)    TempSrc:  Axillary    SpO2: 93%  94% 97%  Weight:      Height:        Intake/Output Summary (Last 24 hours) at 02/26/2018 1515 Last data filed at 02/26/2018 1314 Gross per 24 hour  Intake 3635.77 ml  Output 3795 ml  Net -159.23 ml   Filed Weights   02/24/18 0600 02/25/18 0500 02/26/18 0640  Weight: 127 kg 124.5 kg 124.7 kg   Body mass index is 44.37 kg/m.  General:  Well nourished, well developed, in no acute distress HEENT: normal Lymph: no adenopathy Neck: no JVD Endocrine:  No thryomegaly Vascular: No carotid bruits; FA pulses 2+ bilaterally without bruits  Cardiac:  irreg-irreg; no murmurs, gallops or rubs Lungs:  CTA b/l, no wheezing, rhonchi or rales  Abd: soft, nontender, obese  Ext: 1+  edema LLE has gauze wrap in place, RLE has chronic looking wound changes, dressing to lateral side Musculoskeletal:  No deformities Skin: warm and dry  Neuro:  No gross focal abnormalities noted Psych:  Normal affect   EKG:  The EKG was personally reviewed and demonstrates:   Duke Salvia  #1 AFlutter 123bpm,  #2AFib, 128bpm, PVC, no clear ischemic changes Telemetry:  Telemetry was personally reviewed and demonstrates:   AFib, 80's  Relevant CV Studies:  02/25/18; [facility] IMPRESSION:Ms Tillis has normal coronary arteries and an LVEDP of 23.  Unfortunately, I could not cannulate the brachial vein and therefore did not do a right heart cath  02/22/18: TTE Study Conclusions - HPI and indications: Atrial fibrillation. - Left ventricle: The cavity size was moderately dilated. Wall   thickness was increased in a pattern of mild LVH. Systolic   function was severely reduced. The estimated ejection fraction   was in the range of 25% to 30%. Diffuse hypokinesis. Impressions: - Limited echo. A-fib is noted. LVEF 25-30%, mild LVH, moderately   dilated LV, LV mid-ventricle false tendon, severe global   hypokinesis.  Laboratory Data:  Chemistry Recent Labs  Lab 02/24/18 0809 02/25/18 0809 02/26/18 0422  NA 135 134* 138  K 3.0* 3.9 4.5  CL 90* 92* 91*  CO2 34* 34* 33*  GLUCOSE 133* 127* 146*  BUN 23 21 24*  CREATININE 1.04* 0.83 0.89  CALCIUM 9.1 9.1 9.7  GFRNONAA 56* >60 >60  GFRAA >60 >60 >60  ANIONGAP 11 8 14     Recent Labs  Lab 02/21/18 2315  PROT 7.1  ALBUMIN 3.6  AST 37  ALT 23  ALKPHOS 87  BILITOT 1.1  Hematology Recent Labs  Lab 02/24/18 0234 02/24/18 0757 02/25/18 0244 02/26/18 0422  WBC 10.8*  --  9.8 10.5  RBC 5.03  --  5.15* 5.40*  HGB 14.0 13.9 14.2 15.0  HCT 44.9 41.0 46.0 48.2*  MCV 89.3  --  89.3 89.3  MCH 27.8  --  27.6 27.8  MCHC 31.2  --  30.9 31.1  RDW 17.7*  --  17.2* 17.5*  PLT 222  --  231 275   Cardiac Enzymes Recent Labs  Lab  02/21/18 2315 02/22/18 0530 02/22/18 1032  TROPONINI 0.46* 0.35* 0.35*   No results for input(s): TROPIPOC in the last 168 hours.  BNP Recent Labs  Lab 02/21/18 2315  BNP 334.5*    DDimer No results for input(s): DDIMER in the last 168 hours.  Radiology/Studies:  No results found.  Assessment and Plan:   1. VF arrest (no strips available for review)     No CAD 2.  NICM 3. Acute CHF      Fluid neg cumulatively - , 7lbs 4. Hypothyroidism     Synthroid started here 5. AFib (at least persistent)     CHA2DS2Vasc is 2, started on heparin gtt this admission > Eliquis  6. RN reports staff discovered lice, placed on contact protocol, ID protocol being followed with medicated shampoo  Amiodarone gtt 4 days >> PO Dig 0.149mh daily  Coreg 6.25mg  BID heparin gtt > cath > heparin gtt > eliquis  Unknown duration of her CM, this is new news to the patient though, has not been told of weak heart muscle by her docs in [place]  I think she has indication for an ICD given VF arrest, NICM , narrow QRS, no CRT Implant timing is difficult.  She has b/l LE wounds (has been going to wound clinic in [place]) and reports to  Have a sacral wound as well. She would be high risk of infection, preferable would like these healed up perhaps home with life vest in the meantime  AFib She reports 7 years of palpitations, she thinks AFib found about 3 years ago.  She does not thisn she has AFib all of the time, though somewhat poor historian She remained or was in Afib post defibrillation, has been on amiodarone gtt here and may cardiovert given drug.  Unknown LA size.   She has been without home meds for several months, may be beneficial to wait until she is euthyroid and on her meds before trying to DCCV her AFib.  Dr. Johney Frame will see for final EP recommendations    For questions or updates, please contact [facility] Please consult [email or web address] for contact info under     Signed, Sheilah Pigeon, PA-C  02/26/2018 3:15 PM   I have seen, examined the patient, and reviewed the above assessment and plan.  Changes to above are made where necessary.  On exam, iRRR.  Disheveled, morbidly obese.  Pt with presumed VF arrest (no strips to review), likely longstanding persistent afib, and nonischemic CM.  This is a very difficult situation as this patient does not appear to have been taking care of herself very well.  She has not been taking any medicines recently.  She has skin breakdown, and poor hygiene.  Her prognosis is probably poor regardless or our interventions.     Given structural heart disease and aborted cardiac arrest, I would favor ICD implantation.  The question here is timing.  Ideally, we would allow her to recover from her  skin breakdown issues and lice and then return for ICD.  She could go home with a lifevest.  I worry however that given her prior track record that she will not heal and will continue to have longstanding infectious risks which are increased.  Given her morbid obesity, she is not a great candidate for S-ICD either.  I will ask for Dr Odessa Fleming opinion in the AM regarding this patient.  He and I have spoken by phone this evening.  From an AF standpoint, I will hold anticoagulation in case she is a candidate for ICD tomorrow (Dr Graciela Husbands to further decide).  If no ICD, then eliquis could be restarted.  I would not favor cardioversion at this time.  Continue amiodarone for VF.  Could consider return for cardioversion electively once decisions about ICD implantation have been made.   This is a very complicated patient.  A high level of decision making was required for this encounter.  Dr Graciela Husbands to see in AM.  Co Sign: Hillis Range, MD 02/26/2018 5:46 PM

## 2018-02-27 DIAGNOSIS — E876 Hypokalemia: Secondary | ICD-10-CM

## 2018-02-27 LAB — BASIC METABOLIC PANEL
Anion gap: 11 (ref 5–15)
BUN: 31 mg/dL — AB (ref 8–23)
CHLORIDE: 89 mmol/L — AB (ref 98–111)
CO2: 37 mmol/L — ABNORMAL HIGH (ref 22–32)
CREATININE: 0.86 mg/dL (ref 0.44–1.00)
Calcium: 9.9 mg/dL (ref 8.9–10.3)
GFR calc Af Amer: >60 mL/min (ref >60)
Glucose, Bld: 129 mg/dL — ABNORMAL HIGH (ref 70–99)
Potassium: 3.8 mmol/L (ref 3.5–5.1)
SODIUM: 137 mmol/L (ref 135–145)

## 2018-02-27 MED ORDER — APIXABAN 5 MG PO TABS
5.0000 mg | ORAL_TABLET | Freq: Two times a day (BID) | ORAL | Status: DC
  Administered 2018-02-27 – 2018-03-01 (×4): 5 mg via ORAL
  Filled 2018-02-27 (×4): qty 1

## 2018-02-27 MED ORDER — APIXABAN 5 MG PO TABS
5.0000 mg | ORAL_TABLET | Freq: Two times a day (BID) | ORAL | Status: DC
  Administered 2018-02-27: 5 mg via ORAL
  Filled 2018-02-27: qty 1

## 2018-02-27 NOTE — Progress Notes (Signed)
Patient needs a Technical sales engineer at discharge; received call from [place] with [employer]; orders/ clinical information faxed as requested to [phone]; Abelino Derrick [facility] [phone]

## 2018-02-27 NOTE — Progress Notes (Addendum)
Progress Note  Patient Name: Carrie Walters Date of Encounter: 02/27/2018  Primary Cardiologist: Garwin Brothers, MD   Subjective   No CP, palpitations or SOB  Inpatient Medications    Scheduled Meds: . amiodarone  400 mg Oral BID  . carvedilol  6.25 mg Oral BID WC  . digoxin  0.125 mg Oral Daily  . furosemide  40 mg Oral Daily  . levothyroxine  25 mcg Oral QAC breakfast  . mouth rinse  15 mL Mouth Rinse BID  . sacubitril-valsartan  1 tablet Oral BID  . sodium chloride flush  3 mL Intravenous Q12H  . spironolactone  25 mg Oral Daily   Continuous Infusions: . sodium chloride     PRN Meds: sodium chloride, acetaminophen, albuterol, morphine injection, ondansetron (ZOFRAN) IV, senna-docusate, sodium chloride flush, sorbitol, traMADol   Vital Signs    Vitals:   02/26/18 1917 02/27/18 0026 02/27/18 0258 02/27/18 0909  BP: 110/74 108/64 (!) 142/92 (!) 132/91  Pulse: 74 67 68 (!) 41  Resp: 16 18 18 20   Temp: 98.1 F (36.7 C) 97.9 F (36.6 C) 98.1 F (36.7 C) 98.4 F (36.9 C)  TempSrc: Oral Oral Oral Oral  SpO2: 95% 96% 98% 96%  Weight:   124.4 kg   Height:        Intake/Output Summary (Last 24 hours) at 02/27/2018 1000 Last data filed at 02/27/2018 0814 Gross per 24 hour  Intake 1680 ml  Output 3275 ml  Net -1595 ml   Filed Weights   02/26/18 0640 02/26/18 1800 02/27/18 0258  Weight: 124.7 kg 125.1 kg 124.4 kg    Telemetry    AFib CVR - Personally Reviewed  ECG    No new EKGs - Personally Reviewed  Physical Exam   GEN: No acute distress.   Neck: No JVD Cardiac: irreg-irreg, no murmurs, rubs, or gallops.  Respiratory: CTA b/l GI: Soft, nontender, non-distended  Carrie: No edema; LLE wrapped w/gauze bandage, 1+ edema Neuro:  Nonfocal  Psych: Normal affect   Labs    Chemistry Recent Labs  Lab 02/21/18 2315  02/25/18 0809 02/26/18 0422 02/27/18 0357  NA 138   < > 134* 138 137  K 4.2   < > 3.9 4.5 3.8  CL 105   < > 92* 91* 89*  CO2 21*   < >  34* 33* 37*  GLUCOSE 171*   < > 127* 146* 129*  BUN 14   < > 21 24* 31*  CREATININE 1.13*   < > 0.83 0.89 0.86  CALCIUM 9.1   < > 9.1 9.7 9.9  PROT 7.1  --   --   --   --   ALBUMIN 3.6  --   --   --   --   AST 37  --   --   --   --   ALT 23  --   --   --   --   ALKPHOS 87  --   --   --   --   BILITOT 1.1  --   --   --   --   GFRNONAA 51*   < > >60 >60 >60  GFRAA 59*   < > >60 >60 >60  ANIONGAP 12   < > 8 14 11    < > = values in this interval not displayed.     Hematology Recent Labs  Lab 02/24/18 0234 02/24/18 0757 02/25/18 0244 02/26/18 0422  WBC 10.8*  --  9.8 10.5  RBC 5.03  --  5.15* 5.40*  HGB 14.0 13.9 14.2 15.0  HCT 44.9 41.0 46.0 48.2*  MCV 89.3  --  89.3 89.3  MCH 27.8  --  27.6 27.8  MCHC 31.2  --  30.9 31.1  RDW 17.7*  --  17.2* 17.5*  PLT 222  --  231 275    Cardiac Enzymes Recent Labs  Lab 02/21/18 2315 02/22/18 0530 02/22/18 1032  TROPONINI 0.46* 0.35* 0.35*   No results for input(s): TROPIPOC in the last 168 hours.   BNP Recent Labs  Lab 02/21/18 2315  BNP 334.5*     DDimer No results for input(s): DDIMER in the last 168 hours.   Radiology    No results found.  Cardiac Studies   02/25/18; [facility] IMPRESSION:Carrie Walters has normal coronary arteries and an LVEDP of 23. Unfortunately, I could not cannulate the brachial vein and therefore did not do a right heart cath  02/22/18: TTE Study Conclusions - HPI and indications: Atrial fibrillation. - Left ventricle: The cavity size was moderately dilated. Wall thickness was increased in a pattern of mild LVH. Systolic function was severely reduced. The estimated ejection fraction was in the range of 25% to 30%. Diffuse hypokinesis. Impressions: - Limited echo. A-fib is noted. LVEF 25-30%, mild LVH, moderately dilated LV, LV mid-ventricle false tendon, severe global hypokinesis.  Patient Profile     62 y.o. female with a hx of AFlutter, hypothyroidism, Asthma/COPD, HTN, morbid obesity   initially sought attention at [facility] for weakness, near syncope.  In revew of her record, indicate pt presented to ED with "heart problems," and while there passed out while in the bathroom. Notes indicate no pulse was present, and so CPR was initiated. Pt received 1mg  IV epi, and pt was found to be in VF on a monitor, and 300J x1 was administered w/ ROSC. Pt was alert and responsive. Notes indicate pt was in afib w/ RVR post-episode.  She was admitted to [facility], maintained on amiodarone, heparin gtt, started on synthroid, diuresed, TTE noted LVEF 25-30%, I don't see a LA measurement or description.  Monday LHC noted no CAD.  EP was asked to weigh in on +/- ICD, and AF rate vs rhythm management.   Assessment & Plan    1. VF arrest (no strips available for review)     No CAD 2.  NICM 3. Acute CHF      Fluid neg cumulatively - , 7lbs 4. Hypothyroidism     Synthroid started here 5. AFib (at least persistent)     CHA2DS2Vasc is 2, started on heparin gtt this admission > Eliquis  6. RN reports staff discovered lice yesterday, placed on contact protocol, ID protocol followed with medicated shampoo treatment.   Amiodarone gtt 4 days >> PO Dig 0.166mh daily  Coreg 6.25mg  BID heparin gtt > cath > heparin gtt > eliquis  The patient was seen yesterday by Dr. Johney Frame.  Felt rhythm management at this juncture not optimal given need for ICD and preferably OFF OAC.  Felt patient was likelyl optimized as best as possible though at increased infection risk, probably timing would be this admission, her eliquis held in anticipation of Dr. Odessa Fleming visit today and possible ICD implant.  Dr. Graciela Husbands recommends Life vest and resolution of skin wounds/issues prior to implant.  Will notify [facility], and resume diet and her Eliquis.   For questions or updates, please contact [facility] Please consult [email or web address] for contact info under  Signed, Sheilah Pigeon, PA-C  02/27/2018, 10:00  AM   Patient with cardiac arrest with hypokalemia.  In the wake of it.  I do not know what to make of the 4.5 initial draw.  I do not know where it sits into the temporal sequence of events.  She was 3.0 on arrival here; with minimal downtime, and her history of recurrent problematic hypokalemia, I suspect this may have been the trigger.  However, will be impossible to prove.  Hence, it is a reasonable thing to consider ICD implantation for secondary prevention.  However, she has ongoing staph infections of the lower extremities.  This puts the implantable device at extremely high risk for infection.  I would suggest that we use a LifeVest until her legs can be healed.  She is agreeable.  We will arrange.  We will have her follow-up with Dr. Derek Jack in [place] over the next 4 weeks or so.  Call if we can be of further assistance  Discussed with Dr Fawn Kirk   [facility] will sign off.   Medication Recommendations:  As above  Other recommendations (labs, testing, etc):  lifevest Follow up as an outpatient:  WITH WC in about 4 weeks in [place]

## 2018-02-27 NOTE — Progress Notes (Signed)
Physical Therapy Treatment Patient Details Name: Carrie Walters MRN: [id number] DOB: 12-29-1955 Today's Date: 02/27/2018    History of Present Illness Pt adm from [facility] after Vfib cardiac arrest. Pt with cardiac cath on 10/7 which was clean.  Pt with afib with rvr and acute heart failure. PMH - hypothyroidism, OA bil knees, venous stasis    PT Comments     Patient is progressing very well towards their physical therapy goals. Increased ambulation distance to 150 feet using Rollator for increased steadiness. Patient complaining of mild dizziness upon entrance to room; BP prior to mobility 108/97, post mobility 125/90. Received on 2L O2 at 94% SPO2, maintained 92-94% SpO2 on RA at rest, but decreased to 87% SpO2 on RA following mobility. Returned back to 2L O2 and notified RN. D/c plan remains appropriate.      Follow Up Recommendations  Home health PT     Equipment Recommendations  Other (comment)(bariatric rollator)    Recommendations for Other Services       Precautions / Restrictions Precautions Precautions: Fall Restrictions Weight Bearing Restrictions: No    Mobility  Bed Mobility               General bed mobility comments: Sitting EOB on arrival  Transfers Overall transfer level: Needs assistance Equipment used: 4-wheeled walker Transfers: Sit to/from Stand Sit to Stand: Min assist         General transfer comment: Light min assist. Cues for locking Rollator prior to transfers.  Ambulation/Gait Ambulation/Gait assistance: Min guard Gait Distance (Feet): 150 Feet Assistive device: 4-wheeled walker Gait Pattern/deviations: Step-through pattern;Decreased stride length;Trunk flexed Gait velocity: decr   General Gait Details: Patient with kyphotic posture throughout (likely baseline); cues for looking up for safety. No evidence of gross imbalance   Stairs             Wheelchair Mobility    Modified Rankin (Stroke Patients Only)        Balance Overall balance assessment: Needs assistance Sitting-balance support: No upper extremity supported;Feet supported Sitting balance-Leahy Scale: Good     Standing balance support: No upper extremity supported Standing balance-Leahy Scale: Fair                              Cognition Arousal/Alertness: Awake/alert Behavior During Therapy: WFL for tasks assessed/performed Overall Cognitive Status: Within Functional Limits for tasks assessed                                        Exercises      General Comments        Pertinent Vitals/Pain Pain Assessment: Faces Faces Pain Scale: No hurt    Home Living                      Prior Function            PT Goals (current goals can now be found in the care plan section) Acute Rehab PT Goals Patient Stated Goal: return home PT Goal Formulation: With patient Time For Goal Achievement: 03/12/18 Potential to Achieve Goals: Good Progress towards PT goals: Progressing toward goals    Frequency    Min 3X/week      PT Plan Current plan remains appropriate    Co-evaluation  AM-PAC PT "6 Clicks" Daily Activity  Outcome Measure  Difficulty turning over in bed (including adjusting bedclothes, sheets and blankets)?: Unable Difficulty moving from lying on back to sitting on the side of the bed? : Unable Difficulty sitting down on and standing up from a chair with arms (e.g., wheelchair, bedside commode, etc,.)?: Unable Help needed moving to and from a bed to chair (including a wheelchair)?: A Little Help needed walking in hospital room?: A Little Help needed climbing 3-5 steps with a railing? : A Lot 6 Click Score: 11    End of Session Equipment Utilized During Treatment: Gait belt Activity Tolerance: Patient tolerated treatment well Patient left: with call bell/phone within reach;in bed(seated EOB) Nurse Communication: Mobility status PT Visit  Diagnosis: Other abnormalities of gait and mobility (R26.89);Muscle weakness (generalized) (M62.81)     Time: 8119-1478 PT Time Calculation (min) (ACUTE ONLY): 21 min  Charges:  $Therapeutic Activity: 8-22 mins                    Laurina Bustle, PT, DPT Acute Rehabilitation Services Pager [phone] Office [phone]   Vanetta Mulders 02/27/2018, 8:53 AM

## 2018-02-27 NOTE — Progress Notes (Signed)
Progress Note  Patient Name: Carrie Walters Date of Encounter: 02/27/2018  Primary Cardiologist: Garwin Brothers, MD  Subjective   Patient feeling okay today. She denies chest pain or shortness of breath. Also denies palpitations. She does endorse mild lightheadedness upon awakening or opening her eyes, denies dizziness or association with head movement.  Inpatient Medications    Scheduled Meds: . amiodarone  400 mg Oral BID  . carvedilol  6.25 mg Oral BID WC  . digoxin  0.125 mg Oral Daily  . furosemide  40 mg Oral Daily  . levothyroxine  25 mcg Oral QAC breakfast  . mouth rinse  15 mL Mouth Rinse BID  . sacubitril-valsartan  1 tablet Oral BID  . sodium chloride flush  3 mL Intravenous Q12H  . spironolactone  25 mg Oral Daily   Continuous Infusions: . sodium chloride     PRN Meds: sodium chloride, acetaminophen, albuterol, morphine injection, ondansetron (ZOFRAN) IV, senna-docusate, sodium chloride flush, sorbitol, traMADol   Vital Signs    Vitals:   02/26/18 1917 02/27/18 0026 02/27/18 0258 02/27/18 0909  BP: 110/74 108/64 (!) 142/92 (!) 132/91  Pulse: 74 67 68 (!) 41  Resp: 16 18 18 20   Temp: 98.1 F (36.7 C) 97.9 F (36.6 C) 98.1 F (36.7 C) 98.4 F (36.9 C)  TempSrc: Oral Oral Oral Oral  SpO2: 95% 96% 98% 96%  Weight:   124.4 kg   Height:        Intake/Output Summary (Last 24 hours) at 02/27/2018 1058 Last data filed at 02/27/2018 0814 Gross per 24 hour  Intake 1680 ml  Output 2300 ml  Net -620 ml   Filed Weights   02/26/18 0640 02/26/18 1800 02/27/18 0258  Weight: 124.7 kg 125.1 kg 124.4 kg    Telemetry    A. Flutter with variable block, rate controlled - Personally Reviewed  ECG    No ECG today  Physical Exam   GEN: No acute distress. Obese.   Neck: No JVD Cardiac: Irr Irr; no murmurs, rubs, or gallops. Respiratory: Clear to auscultation bilaterally. GI: Soft, nontender, non-distended  MS: Mild bilateral edema; Venous stasis changes  with clean/dry dressings. 1+ pedal pulses bilaterally, R Radial cath site clean and dry without hematoma. Neuro:  Nonfocal Psych: Normal affect   Labs    Chemistry Recent Labs  Lab 02/21/18 2315  02/25/18 0809 02/26/18 0422 02/27/18 0357  NA 138   < > 134* 138 137  K 4.2   < > 3.9 4.5 3.8  CL 105   < > 92* 91* 89*  CO2 21*   < > 34* 33* 37*  GLUCOSE 171*   < > 127* 146* 129*  BUN 14   < > 21 24* 31*  CREATININE 1.13*   < > 0.83 0.89 0.86  CALCIUM 9.1   < > 9.1 9.7 9.9  PROT 7.1  --   --   --   --   ALBUMIN 3.6  --   --   --   --   AST 37  --   --   --   --   ALT 23  --   --   --   --   ALKPHOS 87  --   --   --   --   BILITOT 1.1  --   --   --   --   GFRNONAA 51*   < > >60 >60 >60  GFRAA 59*   < > >60 >  60 >60  ANIONGAP 12   < > 8 14 11    < > = values in this interval not displayed.     Hematology Recent Labs  Lab 02/24/18 0234 02/24/18 0757 02/25/18 0244 02/26/18 0422  WBC 10.8*  --  9.8 10.5  RBC 5.03  --  5.15* 5.40*  HGB 14.0 13.9 14.2 15.0  HCT 44.9 41.0 46.0 48.2*  MCV 89.3  --  89.3 89.3  MCH 27.8  --  27.6 27.8  MCHC 31.2  --  30.9 31.1  RDW 17.7*  --  17.2* 17.5*  PLT 222  --  231 275    Cardiac Enzymes Recent Labs  Lab 02/21/18 2315 02/22/18 0530 02/22/18 1032  TROPONINI 0.46* 0.35* 0.35*   No results for input(s): TROPIPOC in the last 168 hours.   BNP Recent Labs  Lab 02/21/18 2315  BNP 334.5*     DDimer No results for input(s): DDIMER in the last 168 hours.   Radiology    No results found.  Cardiac Studies   Limited Echo 10/4 Study Conclusions - Left ventricle: The cavity size was moderately dilated. Wall   thickness was increased in a pattern of mild LVH. Systolic   function was severely reduced. The estimated ejection fraction   was in the range of 25% to 30%. Diffuse hypokinesis. Impressions: - Limited echo. A-fib is noted. LVEF 25-30%, mild LVH, moderately   dilated LV, LV mid-ventricle false tendon, severe global    hypokinesis.  Cath 10/7 - Clean Normal Coronary Arteries - RHC unable to be performed  Patient Profile     62 y.o. female presented from [facility] after apparent V-fib arrest, ROSC achieved after single defibrillation shock. Rhythm A.Fib with RVR following this event. Echo has shown Acute Systolic HF with EF 25-30%, etiology unclear. Clean coronary arteries. Evaluating for ICD placement.  Assessment & Plan    1) A. Fib/Flutter: Patient presented in A.fib on Amiodarine gtt for in RVR s/p V-Fib arrest at OSH. Now transitioned to PO amio with persistent a.flutter with rate controlled. History of A. Fib for several years. Reportedly off anticoagulation for about 40yr PTA. Now on DOAC (held for possible ICD implantation). Patient reports previous DC-CV. She will remain on amio and DOAC for now with further discussion of DC-CV deferred until after ICD implantation. - Appreciate EP recommendations - Coreg to 6.25 BID - Digoxin 0.125mg  Daily - Amio 400mg  PO BID - Holding for Eliquis for possible ICD placement  2) Acute HFrEF: Echo w/ EF 25-30%, Diffuse hypokinesis. No previous Echo nor known CHF Hx. Etiology unclear; may be related to tachy arrhythmia. Cath on 10/7 with clean coronaries. Diuresed well with IV lasix, volume status improved, lungs clear, now on PO Lasix. Given reduced EF and presentation for V-Fib arrest; EP evaluating for possible ICD placement this admission.  - Appreciate EP recommendations - Spironolactone 25mg  Daily, Digoxin 0.125mg  Daily, Carvedilol to 6.25 BID - Lasix to 40mg  PO Daily - Entresto 24-26mg  BID   3) Cardiac arrest: No rhythm strips available from event. Reported V. Fib arrest. Unclear etiology. Has A.fib off of anticoagulation for some time. EF reduced on Echo as above. Cath 10/7 with clean coronaries ruling out CAD as etiology. EP evaluating for possible ICD this admission. - Appreciate EP recommendations - Tramadol PRN for chest wall pain  4)  Hypothyroidism: TSH 9.8; Synthroid Daily 5) Venous Stasis changes, Wounds: Continue with wound care 6) Acute Hypoxic Respiratory Failure: Improving with diuresis. Now 94%  on 2L. Likely has a component of OHS/OSA as well. Will need sleep study.   For questions or updates, please contact [facility] Please consult [email or web address] for contact info under      Signed, Beola Cord, MD  02/27/2018, 10:58 AM

## 2018-02-28 LAB — DIGOXIN LEVEL: DIGOXIN LVL: 0.3 ng/mL — AB (ref 0.8–2.0)

## 2018-02-28 MED ORDER — ADULT MULTIVITAMIN W/MINERALS CH
1.0000 | ORAL_TABLET | Freq: Every day | ORAL | Status: DC
  Administered 2018-02-28 – 2018-03-01 (×2): 1 via ORAL
  Filled 2018-02-28 (×2): qty 1

## 2018-02-28 MED ORDER — AMIODARONE HCL 200 MG PO TABS
400.0000 mg | ORAL_TABLET | Freq: Every day | ORAL | Status: DC
  Administered 2018-03-01: 400 mg via ORAL
  Filled 2018-02-28: qty 2

## 2018-02-28 NOTE — Progress Notes (Signed)
Progress Note  Patient Name: Carrie Walters Date of Encounter: 02/28/2018  Primary Cardiologist: Garwin Brothers, MD  Subjective   Patient feeling okay today. She denies chest pain or shortness of breath. Also denies palpitations. She has been up in room. Walked some with PT yesterday.  Inpatient Medications    Scheduled Meds: . amiodarone  400 mg Oral BID  . apixaban  5 mg Oral BID  . carvedilol  6.25 mg Oral BID WC  . digoxin  0.125 mg Oral Daily  . furosemide  40 mg Oral Daily  . levothyroxine  25 mcg Oral QAC breakfast  . mouth rinse  15 mL Mouth Rinse BID  . sacubitril-valsartan  1 tablet Oral BID  . sodium chloride flush  3 mL Intravenous Q12H  . spironolactone  25 mg Oral Daily   Continuous Infusions: . sodium chloride     PRN Meds: sodium chloride, acetaminophen, albuterol, ondansetron (ZOFRAN) IV, senna-docusate, sodium chloride flush, sorbitol, traMADol   Vital Signs    Vitals:   02/28/18 0102 02/28/18 0500 02/28/18 0946 02/28/18 0950  BP: 121/70 133/78  106/85  Pulse: 83 (!) 55 93 96  Resp: 18 18    Temp: 97.8 F (36.6 C) (!) 97.4 F (36.3 C)    TempSrc: Oral Oral    SpO2: 95% 97%    Weight:  123.1 kg    Height:        Intake/Output Summary (Last 24 hours) at 02/28/2018 1151 Last data filed at 02/28/2018 1000 Gross per 24 hour  Intake 520 ml  Output 1450 ml  Net -930 ml   Filed Weights   02/26/18 1800 02/27/18 0258 02/28/18 0500  Weight: 125.1 kg 124.4 kg 123.1 kg    Telemetry    A. fib rate controlled - Personally Reviewed  ECG    No ECG today  Physical Exam   GEN: No acute distress. Obese.   Neck: No JVD Cardiac: Irr Irr; no murmurs, rubs, or gallops. Respiratory: Clear to auscultation bilaterally. GI: Soft, nontender, non-distended  MS: Mild bilateral edema; Venous stasis changes with clean/dry dressings. 1+ pedal pulses bilaterally,  Neuro:  Nonfocal Psych: Normal affect   Labs    Chemistry Recent Labs  Lab  02/21/18 2315  02/25/18 0809 02/26/18 0422 02/27/18 0357  NA 138   < > 134* 138 137  K 4.2   < > 3.9 4.5 3.8  CL 105   < > 92* 91* 89*  CO2 21*   < > 34* 33* 37*  GLUCOSE 171*   < > 127* 146* 129*  BUN 14   < > 21 24* 31*  CREATININE 1.13*   < > 0.83 0.89 0.86  CALCIUM 9.1   < > 9.1 9.7 9.9  PROT 7.1  --   --   --   --   ALBUMIN 3.6  --   --   --   --   AST 37  --   --   --   --   ALT 23  --   --   --   --   ALKPHOS 87  --   --   --   --   BILITOT 1.1  --   --   --   --   GFRNONAA 51*   < > >60 >60 >60  GFRAA 59*   < > >60 >60 >60  ANIONGAP 12   < > 8 14 11    < > = values in  this interval not displayed.     Hematology Recent Labs  Lab 02/24/18 0234 02/24/18 0757 02/25/18 0244 02/26/18 0422  WBC 10.8*  --  9.8 10.5  RBC 5.03  --  5.15* 5.40*  HGB 14.0 13.9 14.2 15.0  HCT 44.9 41.0 46.0 48.2*  MCV 89.3  --  89.3 89.3  MCH 27.8  --  27.6 27.8  MCHC 31.2  --  30.9 31.1  RDW 17.7*  --  17.2* 17.5*  PLT 222  --  231 275    Cardiac Enzymes Recent Labs  Lab 02/21/18 2315 02/22/18 0530 02/22/18 1032  TROPONINI 0.46* 0.35* 0.35*   No results for input(s): TROPIPOC in the last 168 hours.   BNP Recent Labs  Lab 02/21/18 2315  BNP 334.5*     DDimer No results for input(s): DDIMER in the last 168 hours.   Radiology    No results found.  Cardiac Studies   Limited Echo 10/4 Study Conclusions - Left ventricle: The cavity size was moderately dilated. Wall   thickness was increased in a pattern of mild LVH. Systolic   function was severely reduced. The estimated ejection fraction   was in the range of 25% to 30%. Diffuse hypokinesis. Impressions: - Limited echo. A-fib is noted. LVEF 25-30%, mild LVH, moderately   dilated LV, LV mid-ventricle false tendon, severe global   hypokinesis.  Cath 10/7 - Clean Normal Coronary Arteries - RHC unable to be performed  Patient Profile     62 y.o. female presented from [facility] after apparent V-fib arrest,  ROSC achieved after single defibrillation shock. Rhythm A.Fib with RVR following this event. Echo has shown Acute Systolic HF with EF 25-30%, etiology unclear. Clean coronary arteries. Evaluating for ICD placement.  Assessment & Plan    1) A. Fib/Flutter: Patient presented in A.fib on Amiodarine gtt for in RVR s/p V-Fib arrest at OSH. Now transitioned to PO amio with persistent a.flutter with rate controlled. History of A. Fib for several years. Reportedly off anticoagulation for about 53yr PTA. Now on DOAC.  Patient reports previous DC-CV. She will remain on amio and DOAC for now with further discussion of potential DCCV deferred until after ICD implantation. - Appreciate EP recommendations. Plan ED with [facility] to allow wounds to heal. Will need follow up with Dr. Elberta Fortis in [place] in one month - Coreg to 6.25 BID - Digoxin 0.125mg  Daily - Amio 400mg  PO reduce dose to daily. - on Eliquis  2) Acute HFrEF: Echo w/ EF 25-30%, Diffuse hypokinesis. No previous Echo nor known CHF Hx. Etiology unclear; may be related to tachy arrhythmia. Cath on 10/7 with clean coronaries. Diuresed well with IV lasix, volume status improved, lungs clear, now on PO Lasix.  - Appreciate EP recommendations - Spironolactone 25mg  Daily, Digoxin 0.125mg  Daily, Carvedilol to 6.25 BID - Lasix to 40mg  PO Daily - Entresto 24-26mg  BID   3) Cardiac arrest: No rhythm strips available from event. Reported V. Fib arrest. Unclear etiology. Has A.fib off of anticoagulation for some time. EF reduced on Echo as above. Cath 10/7 with clean coronaries ruling out CAD as etiology. EP evaluating for possible ICD this admission. - Appreciate EP recommendations - Tramadol PRN for chest wall pain  4) Hypothyroidism: TSH 9.8; Synthroid Daily  5) Venous Stasis changes, Wounds: Continue with wound care  6) Acute Hypoxic Respiratory Failure: Improving with diuresis. Now 94% on 2L. Likely has a component of OHS/OSA as well. Will need  sleep study.  Anticipate DC  tomorrow with home health PT and [facility]. Will need cardiology and EP follow up in [place].    For questions or updates, please contact [facility] Please consult [email or web address] for contact info under      Signed, Tanyon Alipio [place], MD  02/28/2018, 11:51 AM

## 2018-02-28 NOTE — Progress Notes (Signed)
Initial Nutrition Assessment  DOCUMENTATION CODES:   Morbid obesity  INTERVENTION:   -MVI with minerals daily  NUTRITION DIAGNOSIS:   Increased nutrient needs related to wound healing as evidenced by estimated needs.  GOAL:   Patient will meet greater than or equal to 90% of their needs  MONITOR:   PO intake, Supplement acceptance, Labs, Weight trends, Skin, I & O's  REASON FOR ASSESSMENT:   LOS    ASSESSMENT:   Carrie Walters is a 62 y.o. female w/ h/o morbid obesity, atrial flutter per recent cardiac notes, severe hypothyroidism w/ recent TSH noted to be 10.0 at [facility] (per cardiology note from august 2019, and repeat TSH 7.5 drawn 12-27-17, but pt has not been started on any thyroid supplementation at this time as far as I am aware), COPD, HTN admitted after an apparent cardiac arrest at OSH ED. Notes from [facility] indicate pt presented to ED with "heart problems," and while there passed out while in the bathroom. Notes indicate no pulse was present, and so CPR was initiated. Pt received 1mg  IV epi, and pt was found to be in VF on a monitor, and 300J x1 was administered w/ ROSC. Pt was alert and responsive. Notes indicate pt was in afib w/ RVR post-episode. Upon arrival at [facility], pt is in afib w/ RVR, rate low 100s. She is on amiodarone IV gtt.  Pt admitted with a-fib. Transferred from [facility] where she was admitted with v-fib cardiac arrest.   Spoke with pt at bedside, who reports very good appetite. Noted meal completion 100%. Pt reports consuming all of her breakfast this morning. She reports PTA she was consuming 2 meals per day- meals are usually prepared by her daughter and consists of a meat, starch, and vegetable. Pt reports she uses minimal salt at the table and does not think her daughter uses any seasoning when cooking.   Pt unsure of UBW and does not have a scale at home to monitor this. She thinks she has lost weight, but unsure how much (suspect this  is related to diuresis).   Pt reports chronic venous stasis wounds that were present PTA. She shares that these have improved since hospital admission.   Discussed with pt importance of good meal intake to promote healing. Also discussed importance of adherence of a low NA diet to assist with heart failure management.   Labs reviewed.   NUTRITION - FOCUSED PHYSICAL EXAM:    Most Recent Value  Orbital Region  No depletion  Upper Arm Region  No depletion  Thoracic and Lumbar Region  No depletion  Buccal Region  No depletion  Temple Region  No depletion  Clavicle Bone Region  No depletion  Clavicle and Acromion Bone Region  No depletion  Scapular Bone Region  No depletion  Dorsal Hand  No depletion  Patellar Region  No depletion  Anterior Thigh Region  No depletion  Posterior Calf Region  No depletion  Edema (RD Assessment)  Moderate  Hair  Reviewed  Eyes  Reviewed  Mouth  Reviewed  Skin  Reviewed  Nails  Reviewed       Diet Order:   Diet Order            Diet Heart Room service appropriate? Yes; Fluid consistency: Thin  Diet effective now              EDUCATION NEEDS:   Education needs have been addressed  Skin:  Skin Assessment: Skin Integrity Issues: Skin  Integrity Issues:: Other (Comment) Other: venous stasis ulcers on bilateral legs  Last BM:  02/28/18  Height:   Ht Readings from Last 1 Encounters:  02/26/18 5\' 6"  (1.676 m)    Weight:   Wt Readings from Last 1 Encounters:  02/28/18 123.1 kg    Ideal Body Weight:  59 kg  BMI:  Body mass index is 43.8 kg/m.  Estimated Nutritional Needs:   Kcal:  1800-2000  Protein:  100-115 grams  Fluid:  1.8-2.0 L    Bralen Wiltgen A. Mayford Knife, RD, LDN, CDE Pager: [phone] After hours Pager: [phone]

## 2018-03-01 MED ORDER — FUROSEMIDE 40 MG PO TABS: 40.0000 mg | ORAL_TABLET | Freq: Every day | ORAL | 3 refills | Status: DC

## 2018-03-01 MED ORDER — CARVEDILOL 6.25 MG PO TABS: 6.2500 mg | ORAL_TABLET | Freq: Two times a day (BID) | ORAL | 6 refills | Status: DC

## 2018-03-01 MED ORDER — AMIODARONE HCL 400 MG PO TABS: 400.0000 mg | ORAL_TABLET | Freq: Every day | ORAL | 1 refills | Status: DC

## 2018-03-01 MED ORDER — DIGOXIN 125 MCG PO TABS: 0.1250 mg | ORAL_TABLET | Freq: Every day | ORAL | 3 refills | Status: DC

## 2018-03-01 MED ORDER — SACUBITRIL-VALSARTAN 24-26 MG PO TABS: 1.0000 | ORAL_TABLET | Freq: Two times a day (BID) | ORAL | 3 refills | Status: DC

## 2018-03-01 MED ORDER — LEVOTHYROXINE SODIUM 25 MCG PO TABS: 25.0000 ug | ORAL_TABLET | Freq: Every day | ORAL | 2 refills | Status: DC

## 2018-03-01 MED ORDER — SPIRONOLACTONE 25 MG PO TABS: 25.0000 mg | ORAL_TABLET | Freq: Every day | ORAL | 6 refills | Status: DC

## 2018-03-01 MED ORDER — APIXABAN 5 MG PO TABS: 5.0000 mg | ORAL_TABLET | Freq: Two times a day (BID) | ORAL | 11 refills | Status: DC

## 2018-03-01 MED FILL — CARVEDILOL 6.25 MG TABLET: 6.25 | 30 days supply | Qty: 60 | Fill #0 | Status: TO

## 2018-03-01 MED FILL — SPIRONOLACTONE 25 MG TABLET: 25 | 30 days supply | Qty: 30 | Fill #0 | Status: TO

## 2018-03-01 MED FILL — ELIQUIS 5 MG TABLET: 5 | 30 days supply | Qty: 60 | Fill #0 | Status: TO

## 2018-03-01 MED FILL — AMIODARONE HCL 200 MG TAB: 200 | 30 days supply | Qty: 60 | Fill #0 | Status: TO

## 2018-03-01 MED FILL — FUROSEMIDE 40 MG TABLET: 40 | 34 days supply | Qty: 40 | Fill #0 | Status: TO

## 2018-03-01 MED FILL — ENTRESTO 24 MG-26 MG TABLET: 24-26 | 30 days supply | Qty: 60 | Fill #0 | Status: TO

## 2018-03-01 MED FILL — DIGOXIN 0.125 MG TABLET: 125 | 30 days supply | Qty: 30 | Fill #0 | Status: TO

## 2018-03-01 MED FILL — LEVOTHYROXINE 25 MCG TABLET: 25 | 30 days supply | Qty: 30 | Fill #0 | Status: TO

## 2018-03-01 NOTE — Progress Notes (Signed)
[facility] V[facility] in room; HHC arranged with [facility]; Alexis Goodell [phone]

## 2018-03-01 NOTE — Progress Notes (Signed)
Discharge patient to home, family to transport. LifeVest placed prior to discharged. Patient alert and oriented, no complaints of any discomfort upon discharge. PIV removed no s/sx of infiltration or swelling noted. Discharged instructions and follow up appointments discussed with patient verbalized understanding.

## 2018-03-01 NOTE — Discharge Summary (Signed)
Discharge Summary    Patient ID: Carrie Walters MRN: [id number]; DOB: Jan 04, 1956  Admit date: 02/21/2018 Discharge date: 03/01/2018  Primary Care Provider: [name], MD  Primary Cardiologist: Garwin Brothers, MD  Primary Electrophysiologist:  Will be Dr. Elberta Fortis  Discharge Diagnoses    Active Problems:   A-fib [facility])   Pressure injury of skin   Cardiac arrest (HCC)   Acute systolic CHF (congestive heart failure) (HCC)   NICM   Venous statis with LE edema   Persistent atrial flutter  Morbid obesity   Acute Hypoxic Respiratory Failure Hypothyroidism    Allergies Allergies  Allergen Reactions  . Metoprolol Tartrate Shortness Of Breath  . Biaxin [Clarithromycin] Other (See Comments)    Sores in mouth  . Contrast Media [Iodinated Diagnostic Agents] Rash    Diagnostic Studies/Procedures    Echo 02/22/18 Study Conclusions  - HPI and indications: Atrial fibrillation. - Left ventricle: The cavity size was moderately dilated. Wall   thickness was increased in a pattern of mild LVH. Systolic   function was severely reduced. The estimated ejection fraction   was in the range of 25% to 30%. Diffuse hypokinesis.  Impressions:  - Limited echo. A-fib is noted. LVEF 25-30%, mild LVH, moderately   dilated LV, LV mid-ventricle false tendon, severe global   hypokinesis.  LEFT HEART CATH AND CORONARY ANGIOGRAPHY 02/25/18 IMPRESSION:Carrie Walters has normal coronary arteries and an LVEDP of 23.  Unfortunately, I could not cannulate the brachial vein and therefore did not do a right heart cath.  The radial artery sheath was removed and a TR band was placed on the right wrist to achieve patent hemostasis.  The patient left the lab in stable condition.  She will be treated for nonischemic car myopathy with pharmacologic optimization.  Given her presentation as cardiac arrest and ventricular fibrillation she will likely need an ICD placed prior to discharge History of Present Illness     Carrie Walters is a 62 y.o. female with a hx of persistent AFlutter, hypothyroidism, Asthma/COPD, HTN, morbid obesity transferred from [facility] 02/21/18 after cardiac arrest.   She reported she has had afib for the past 4-5 years. She had been on coumadin before, but it was stopped for unclear reasons when she moved to [place] about a year or so ago. She reported she has had hypothyroidism for a long time, and was on synthroid up [place]. When she moved to [place], she was on the synthroid until she ran out and had been off of it for several months now.   Carrie Walters initially sought attention at [facility]. She presented to ED with "heart problems," and while there passed out while in the bathroom. Notes indicated no pulse was present, and so CPR was initiated. Pt received 1mg  IV epi, and pt was found to be in VF on a monitor, and 300J x1 was administered w/ ROSC. Pt was alert and responsive. Pt was in afib w/ RVR post-episode. Transferred to [facility] on IV amiodarone.     Hospital Course     Consultants: EP  1) A. Fib/Flutter: Patient presented in A.fib on Amiodarine gtt for in RVR s/p V-Fib arrest at OSH. After improved rate amiodarone  transitioned to PO. Marland Kitchen History of A. Fib for several years. Reportedly off anticoagulation for about 88yr PTA. Started on DOAC. The patient was seen by EP for ICD consideration given VF arrest, NICM , narrow QRS, no CRT. 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 She was fitted with LifeVest until follow up with Dr. Elberta Fortis.  - He will be discharged on Coreg to 6.25 BID,  Digoxin 0.125mg  Daily, Amio 400mg  PO daily and Eliquis 5mg  BID.  2) Acute Systolic HF/NICM: Echo w/ EF 25-30%, Diffuse hypokinesis. No previous Echo nor known CHF Hx. Etiology unclear; may be related to tachy arrhythmia. Cath on 10/7 with clean coronaries. Diuresed well with IV lasix with improved status now on PO Lasix.  Continue below medications.  - Spironolactone  25mg  Daily, Digoxin 0.125mg  Daily, Carvedilol to 6.25 BID -Lasix to 40mg  PO Daily - Entresto 24-26mg  BID   3) Cardiac arrest: No rhythm strips available from event. Reported V. Fib arrest. Unclear etiology. Has A.fib off of anticoagulation for some time. EF reduced on Echo as above. Cath 10/7 with clean coronaries ruling out CAD as etiology.  She has history of recurrent problematic hypokalemia, Dr. Graciela Husbands suspected this may have been the trigger. - On amiodarone and LifeVest until ICD consideration as outpatient.   4) Hypothyroidism: TSH 9.8 on 02/21/18. Started on Synthroid Daily. Recheck labs in few weeks. Need close follow up as on amiodarone.   5) Venous Stasis changes, Wounds: Continue with wound care. He has treated with multiple rounds of abx in past.   6) Acute Hypoxic Respiratory Failure: Improvedwith diuresis. Now 94% on 2L. Likely has a component of OHS/OSA as well. Will need sleep study as outpatient.   Discharge Vitals Blood pressure 94/63, pulse 90, temperature 97.8 F (36.6 C), temperature source Oral, resp. rate 18, height 5\' 6"  (1.676 m), weight 123.9 kg, SpO2 93 %.  Filed Weights   02/27/18 0258 02/28/18 0500 03/01/18 0556  Weight: 124.4 kg 123.1 kg 123.9 kg   GEN:No acute distress. Obese.   Neck:No JVD Cardiac:Irr Irr; no murmurs, rubs, or gallops. Respiratory:Clear to auscultation bilaterally. RU:EAVW, nontender, non-distended  UJ:WJXB bilateral edema; Venous stasis changes with clean/dry dressings. 1+ pedal pulses bilaterally Neuro:Nonfocal Psych: Normal affect   Labs & Radiologic Studies    Basic Metabolic Panel Recent Labs    14/78/29 0357  NA 137  K 3.8  CL 89*  CO2 37*  GLUCOSE 129*  BUN 31*  CREATININE 0.86  CALCIUM 9.9    Disposition   Pt is being discharged home today in good condition.  Follow-up Plans & Appointments    Follow-up Information    [facility].   Specialty:  Home Health  Services Why:  Registered nurse for wound care; physical therapy Contact information: [address] [place] [address] [phone]        [name], MD Follow up on 04/08/2018.   Specialty:  Cardiology Why:  12:15PM Contact information: [address] [place] [address] [phone]        Georgeanna Lea, MD. Go on 03/07/2018.   Specialty:  Cardiology Why:  @10 :30am for hospital follow up  Contact information: [address] [place] [address] [phone]          Discharge Instructions    Diet - low sodium heart healthy   Complete by:  As directed    Discharge instructions   Complete by:  As directed    No driving or return to work until seen in clinic next week.  No lifting over 5 lbs for 1 week. No sexual activity for 1 week. YKeep procedure site clean & dry. If you notice increased pain, swelling, bleeding or pus, call/return!  You may shower, but no soaking baths/hot tubs/pools for 1 week.   *Weigh yourself on the same scale at same time of day and keep a log. *Report weight gain of > 2 lbs in 1 day or 5 lbs over the course of a week and/or symptoms of excess fluid (shortness of breath, difficulty lying flat, swelling, poor appetite, abdominal fullness/bloating, etc) to your doctor immediately. *Avoid foods that are high in sodium (processed, pre-packaged/canned goods, fast foods, etc). *Please attend all scheduled and reccommended follow up appointments   Increase activity slowly   Complete by:  As directed       Discharge Medications   Allergies as of 03/01/2018      Reactions   Metoprolol Tartrate Shortness Of Breath   Biaxin [clarithromycin] Other (See Comments)   Sores in mouth   Contrast Media [iodinated Diagnostic Agents] Rash      Medication List    STOP taking these medications   acetaminophen 500 MG tablet Commonly known as:  TYLENOL   aspirin EC 325 MG tablet   metolazone 2.5 MG tablet Commonly  known as:  ZAROXOLYN   nebivolol 10 MG tablet Commonly known as:  BYSTOLIC   sulfamethoxazole-trimethoprim 800-160 MG tablet Commonly known as:  BACTRIM DS,SEPTRA DS   verapamil 180 MG CR tablet Commonly known as:  CALAN-SR     TAKE these medications   albuterol 108 (90 Base) MCG/ACT inhaler Commonly known as:  PROVENTIL HFA;VENTOLIN HFA Inhale 2 puffs into the lungs every 6 (six) hours as needed for wheezing or shortness of breath.   amiodarone 400 MG tablet Commonly known as:  PACERONE Take 1 tablet (400 mg total) by mouth daily. Needs to see Dr. Elberta Fortis with TSH recheck prior to further refills Start taking on:  03/02/2018   apixaban 5 MG Tabs tablet Commonly known as:  ELIQUIS Take 1 tablet (5 mg total) by mouth 2 (two) times daily.   carvedilol 6.25 MG tablet Commonly known as:  COREG Take 1 tablet (6.25 mg total) by mouth 2 (two) times daily with a meal.   digoxin 0.125 MG tablet Commonly known as:  LANOXIN Take 1 tablet (0.125 mg total) by mouth daily.   furosemide 40 MG tablet Commonly known as:  LASIX Take 1 tablet (40 mg total) by mouth daily. Take additional 1/2 tablet (20mg ) for edema or dyspnea. What changed:  additional instructions   levothyroxine 25 MCG tablet Commonly known as:  SYNTHROID, LEVOTHROID Take 1 tablet (25 mcg total) by mouth daily before breakfast. Needs PCP/cardiologist visit with TSH check for further refills Start taking on:  03/02/2018   sacubitril-valsartan 24-26 MG Commonly known as:  ENTRESTO Take 1 tablet by mouth 2 (two) times daily.   spironolactone 25 MG tablet Commonly known as:  ALDACTONE Take 1 tablet (25 mg total) by mouth daily. Start taking on:  03/02/2018        Acute coronary syndrome (MI, NSTEMI, STEMI, etc) this admission?: No.    Outstanding Labs/Studies   TSH in 4 weeks  BMET at follow up  Duration of Discharge Encounter   Greater than 30 minutes including physician time.  Lorelei Pont, PA 03/01/2018, 11:19 AM

## 2018-03-05 ENCOUNTER — Telehealth: Payer: Self-pay | Admitting: Cardiology

## 2018-03-05 NOTE — Telephone Encounter (Signed)
Has questions about her having orders for physical therapy

## 2018-03-05 NOTE — Telephone Encounter (Signed)
Requested that PT call PCP for order for at home PT due to Dr. Tomie China being out of the office for 3 weeks. If order id needed PT will call back.

## 2018-03-07 ENCOUNTER — Ambulatory Visit: Payer: Medicaid Other | Admitting: Cardiology

## 2018-03-07 ENCOUNTER — Telehealth: Payer: Self-pay | Admitting: Cardiology

## 2018-03-07 NOTE — Telephone Encounter (Signed)
Asking [place] if we will be able to sign off on this.

## 2018-03-07 NOTE — Telephone Encounter (Signed)
Carrie Walters is requesting orders for skilled nursing for [facility] weeks from [facility]. Her number is [phone]

## 2018-03-11 ENCOUNTER — Telehealth: Payer: Self-pay | Admitting: *Deleted

## 2018-03-11 NOTE — Telephone Encounter (Signed)
Put on medication in the hospital and now having gas and shortness of breath and wheezing. New meds are: Carvedilol, Eliquis, and Entresto which she takes at night. Is there any way these could be altered to alleviate some of these symptoms. Did not take in last 2 days andf feel a lot better. Also chest felt tight while taking and now it's not. PLeae advise

## 2018-03-11 NOTE — Telephone Encounter (Signed)
Patient reports shortness of breath for 1 week that remains constant and she notices it more at night time along with along of gas. Patient also reported chest tightness yesterday but none now. She hasn't taken carvedilol, eliquis, or entresto for 2 days and feels better since not taking them. Patient advised to go to the emergency room if active chest pain arises. Will consult with Dr. Bing Matter for recommendations

## 2018-03-11 NOTE — Telephone Encounter (Signed)
Please f/u with [facility] regarding this patient. Thanks.

## 2018-03-12 NOTE — Telephone Encounter (Signed)
Per Dr. Bing Matter patient advised to start back eliquis and entresto and to call us back and let us know how she is doing. Patient verbally understands.

## 2018-03-13 NOTE — Telephone Encounter (Signed)
Per Dr. Bing Matter this is to be routed to patient's primary care provider. I have left message for Lurena Joiner regarding this.

## 2018-03-14 ENCOUNTER — Inpatient Hospital Stay (HOSPITAL_COMMUNITY): Payer: Medicaid Other

## 2018-03-14 ENCOUNTER — Other Ambulatory Visit: Payer: Self-pay

## 2018-03-14 ENCOUNTER — Inpatient Hospital Stay (HOSPITAL_COMMUNITY)
Admission: EM | Admit: 2018-03-14 | Discharge: 2018-03-19 | DRG: 291 | Disposition: A | Discharge status: Home Health | Payer: Medicaid Other | Source: Other Acute Inpatient Hospital | Attending: Internal Medicine | Admitting: Internal Medicine

## 2018-03-14 ENCOUNTER — Encounter (HOSPITAL_COMMUNITY): Payer: Self-pay | Admitting: *Deleted

## 2018-03-14 ENCOUNTER — Inpatient Hospital Stay: Payer: Self-pay

## 2018-03-14 DIAGNOSIS — Z9049 Acquired absence of other specified parts of digestive tract: Secondary | ICD-10-CM

## 2018-03-14 DIAGNOSIS — L8990 Pressure ulcer of unspecified site, unspecified stage: Secondary | ICD-10-CM | POA: Diagnosis present

## 2018-03-14 DIAGNOSIS — K529 Noninfective gastroenteritis and colitis, unspecified: Secondary | ICD-10-CM | POA: Diagnosis present

## 2018-03-14 DIAGNOSIS — Z8249 Family history of ischemic heart disease and other diseases of the circulatory system: Secondary | ICD-10-CM | POA: Diagnosis not present

## 2018-03-14 DIAGNOSIS — I428 Other cardiomyopathies: Secondary | ICD-10-CM | POA: Diagnosis present

## 2018-03-14 DIAGNOSIS — Z87891 Personal history of nicotine dependence: Secondary | ICD-10-CM | POA: Diagnosis not present

## 2018-03-14 DIAGNOSIS — I4892 Unspecified atrial flutter: Secondary | ICD-10-CM | POA: Diagnosis present

## 2018-03-14 DIAGNOSIS — I959 Hypotension, unspecified: Secondary | ICD-10-CM | POA: Diagnosis not present

## 2018-03-14 DIAGNOSIS — L97929 Non-pressure chronic ulcer of unspecified part of left lower leg with unspecified severity: Secondary | ICD-10-CM | POA: Diagnosis present

## 2018-03-14 DIAGNOSIS — I480 Paroxysmal atrial fibrillation: Secondary | ICD-10-CM | POA: Diagnosis not present

## 2018-03-14 DIAGNOSIS — E876 Hypokalemia: Secondary | ICD-10-CM | POA: Diagnosis present

## 2018-03-14 DIAGNOSIS — L97919 Non-pressure chronic ulcer of unspecified part of right lower leg with unspecified severity: Secondary | ICD-10-CM | POA: Diagnosis present

## 2018-03-14 DIAGNOSIS — E039 Hypothyroidism, unspecified: Secondary | ICD-10-CM | POA: Diagnosis present

## 2018-03-14 DIAGNOSIS — I5043 Acute on chronic combined systolic (congestive) and diastolic (congestive) heart failure: Secondary | ICD-10-CM | POA: Diagnosis present

## 2018-03-14 DIAGNOSIS — N200 Calculus of kidney: Secondary | ICD-10-CM | POA: Diagnosis present

## 2018-03-14 DIAGNOSIS — I878 Other specified disorders of veins: Secondary | ICD-10-CM | POA: Diagnosis present

## 2018-03-14 DIAGNOSIS — J441 Chronic obstructive pulmonary disease with (acute) exacerbation: Secondary | ICD-10-CM | POA: Diagnosis present

## 2018-03-14 DIAGNOSIS — Z881 Allergy status to other antibiotic agents status: Secondary | ICD-10-CM

## 2018-03-14 DIAGNOSIS — I4891 Unspecified atrial fibrillation: Secondary | ICD-10-CM | POA: Diagnosis not present

## 2018-03-14 DIAGNOSIS — M1990 Unspecified osteoarthritis, unspecified site: Secondary | ICD-10-CM | POA: Diagnosis present

## 2018-03-14 DIAGNOSIS — Z888 Allergy status to other drugs, medicaments and biological substances status: Secondary | ICD-10-CM | POA: Diagnosis not present

## 2018-03-14 DIAGNOSIS — I739 Peripheral vascular disease, unspecified: Secondary | ICD-10-CM | POA: Diagnosis not present

## 2018-03-14 DIAGNOSIS — L0390 Cellulitis, unspecified: Secondary | ICD-10-CM | POA: Diagnosis not present

## 2018-03-14 DIAGNOSIS — I110 Hypertensive heart disease with heart failure: Principal | ICD-10-CM | POA: Diagnosis present

## 2018-03-14 DIAGNOSIS — Z8674 Personal history of sudden cardiac arrest: Secondary | ICD-10-CM | POA: Diagnosis not present

## 2018-03-14 DIAGNOSIS — I340 Nonrheumatic mitral (valve) insufficiency: Secondary | ICD-10-CM | POA: Diagnosis not present

## 2018-03-14 DIAGNOSIS — R0602 Shortness of breath: Secondary | ICD-10-CM | POA: Diagnosis present

## 2018-03-14 DIAGNOSIS — I482 Chronic atrial fibrillation, unspecified: Secondary | ICD-10-CM

## 2018-03-14 DIAGNOSIS — Z6841 Body Mass Index (BMI) 40.0 and over, adult: Secondary | ICD-10-CM | POA: Diagnosis not present

## 2018-03-14 DIAGNOSIS — J9601 Acute respiratory failure with hypoxia: Secondary | ICD-10-CM | POA: Diagnosis present

## 2018-03-14 DIAGNOSIS — Z7901 Long term (current) use of anticoagulants: Secondary | ICD-10-CM

## 2018-03-14 DIAGNOSIS — R197 Diarrhea, unspecified: Secondary | ICD-10-CM | POA: Diagnosis present

## 2018-03-14 DIAGNOSIS — I872 Venous insufficiency (chronic) (peripheral): Secondary | ICD-10-CM | POA: Diagnosis present

## 2018-03-14 DIAGNOSIS — Z452 Encounter for adjustment and management of vascular access device: Secondary | ICD-10-CM

## 2018-03-14 DIAGNOSIS — R1012 Left upper quadrant pain: Secondary | ICD-10-CM | POA: Diagnosis present

## 2018-03-14 DIAGNOSIS — Z79899 Other long term (current) drug therapy: Secondary | ICD-10-CM

## 2018-03-14 DIAGNOSIS — E662 Morbid (severe) obesity with alveolar hypoventilation: Secondary | ICD-10-CM | POA: Diagnosis present

## 2018-03-14 DIAGNOSIS — Z79890 Hormone replacement therapy (postmenopausal): Secondary | ICD-10-CM

## 2018-03-14 DIAGNOSIS — Z91041 Radiographic dye allergy status: Secondary | ICD-10-CM

## 2018-03-14 HISTORY — DX: Cellulitis, unspecified: L03.90

## 2018-03-14 LAB — RESPIRATORY PANEL BY PCR
Adenovirus: NOT DETECTED
BORDETELLA PERTUSSIS-RVPCR: NOT DETECTED
CHLAMYDOPHILA PNEUMONIAE-RVPPCR: NOT DETECTED
CORONAVIRUS HKU1-RVPPCR: NOT DETECTED
Coronavirus 229E: NOT DETECTED
Coronavirus NL63: NOT DETECTED
Coronavirus OC43: NOT DETECTED
INFLUENZA B-RVPPCR: NOT DETECTED
Influenza A: NOT DETECTED
METAPNEUMOVIRUS-RVPPCR: NOT DETECTED
Mycoplasma pneumoniae: NOT DETECTED
PARAINFLUENZA VIRUS 2-RVPPCR: NOT DETECTED
PARAINFLUENZA VIRUS 3-RVPPCR: NOT DETECTED
Parainfluenza Virus 1: NOT DETECTED
Parainfluenza Virus 4: NOT DETECTED
RESPIRATORY SYNCYTIAL VIRUS-RVPPCR: NOT DETECTED
RHINOVIRUS / ENTEROVIRUS - RVPPCR: NOT DETECTED

## 2018-03-14 LAB — CBC WITH DIFFERENTIAL/PLATELET
Abs Immature Granulocytes: 0.06 10*3/uL (ref 0.00–0.07)
Basophils Absolute: 0.1 10*3/uL (ref 0.0–0.1)
Basophils Relative: 1 %
EOS ABS: 0.2 10*3/uL (ref 0.0–0.5)
EOS PCT: 2 %
HEMATOCRIT: 48.4 % — AB (ref 36.0–46.0)
HEMOGLOBIN: 14.5 g/dL (ref 12.0–15.0)
Immature Granulocytes: 1 %
LYMPHS PCT: 17 %
Lymphs Abs: 2.1 10*3/uL (ref 0.7–4.0)
MCH: 26.9 pg (ref 26.0–34.0)
MCHC: 30 g/dL (ref 30.0–36.0)
MCV: 89.6 fL (ref 80.0–100.0)
MONO ABS: 0.7 10*3/uL (ref 0.1–1.0)
Monocytes Relative: 6 %
Neutro Abs: 9.2 10*3/uL — ABNORMAL HIGH (ref 1.7–7.7)
Neutrophils Relative %: 73 %
Platelets: 354 10*3/uL (ref 150–400)
RBC: 5.4 MIL/uL — AB (ref 3.87–5.11)
RDW: 16.5 % — ABNORMAL HIGH (ref 11.5–15.5)
WBC: 12.4 10*3/uL — AB (ref 4.0–10.5)
nRBC: 0 % (ref 0.0–0.2)

## 2018-03-14 LAB — MAGNESIUM: Magnesium: 2 mg/dL (ref 1.7–2.4)

## 2018-03-14 LAB — BASIC METABOLIC PANEL
Anion gap: 9 (ref 5–15)
BUN: 17 mg/dL (ref 8–23)
CHLORIDE: 100 mmol/L (ref 98–111)
CO2: 28 mmol/L (ref 22–32)
Calcium: 9.2 mg/dL (ref 8.9–10.3)
Creatinine, Ser: 1.13 mg/dL — ABNORMAL HIGH (ref 0.44–1.00)
GFR calc Af Amer: 59 mL/min — ABNORMAL LOW (ref >60)
GFR calc non Af Amer: 51 mL/min — ABNORMAL LOW (ref >60)
GLUCOSE: 142 mg/dL — AB (ref 70–99)
POTASSIUM: 3.5 mmol/L (ref 3.5–5.1)
Sodium: 137 mmol/L (ref 135–145)

## 2018-03-14 LAB — PROTIME-INR
INR: 1.13
Prothrombin Time: 14.4 seconds (ref 11.4–15.2)

## 2018-03-14 LAB — TROPONIN I

## 2018-03-14 LAB — MRSA PCR SCREENING: MRSA by PCR: NEGATIVE

## 2018-03-14 LAB — PROCALCITONIN: Procalcitonin: <0.1 ng/mL

## 2018-03-14 LAB — BRAIN NATRIURETIC PEPTIDE: B NATRIURETIC PEPTIDE 5: 99.7 pg/mL (ref 0.0–100.0)

## 2018-03-14 LAB — DIGOXIN LEVEL: DIGOXIN LVL: 0.8 ng/mL (ref 0.8–2.0)

## 2018-03-14 LAB — LACTIC ACID, PLASMA
LACTIC ACID, VENOUS: 1.6 mmol/L (ref 0.5–1.9)
Lactic Acid, Venous: 1 mmol/L (ref 0.5–1.9)

## 2018-03-14 LAB — APTT: aPTT: 33 seconds (ref 24–36)

## 2018-03-14 LAB — FIBRINOGEN: Fibrinogen: 602 mg/dL — ABNORMAL HIGH (ref 210–475)

## 2018-03-14 MED ORDER — ACETAMINOPHEN 325 MG PO TABS
650.0000 mg | ORAL_TABLET | ORAL | Status: DC | PRN
  Administered 2018-03-14 – 2018-03-16 (×2): 650 mg via ORAL
  Filled 2018-03-14 (×2): qty 2

## 2018-03-14 MED ORDER — FUROSEMIDE 10 MG/ML IJ SOLN: 40.0000 mg | Freq: Two times a day (BID) | INTRAMUSCULAR | Status: DC

## 2018-03-14 MED ORDER — IPRATROPIUM BROMIDE 0.02 % IN SOLN: 0.5000 mg | Freq: Four times a day (QID) | RESPIRATORY_TRACT | Status: DC

## 2018-03-14 MED ORDER — SODIUM CHLORIDE 0.9% FLUSH: 3.0000 mL | INTRAVENOUS | Status: DC | PRN

## 2018-03-14 MED ORDER — POTASSIUM CHLORIDE CRYS ER 20 MEQ PO TBCR
40.0000 meq | EXTENDED_RELEASE_TABLET | Freq: Once | ORAL | Status: AC
  Administered 2018-03-14: 40 meq via ORAL
  Filled 2018-03-14: qty 2

## 2018-03-14 MED ORDER — SODIUM CHLORIDE 0.9% FLUSH
3.0000 mL | Freq: Two times a day (BID) | INTRAVENOUS | Status: DC
  Administered 2018-03-14 – 2018-03-18 (×4): 3 mL via INTRAVENOUS

## 2018-03-14 MED ORDER — SPIRONOLACTONE 25 MG PO TABS: 25.0000 mg | ORAL_TABLET | Freq: Every day | ORAL | Status: DC

## 2018-03-14 MED ORDER — SODIUM CHLORIDE 0.9 % IV BOLUS: 500.0000 mL | Freq: Once | INTRAVENOUS | Status: DC

## 2018-03-14 MED ORDER — SODIUM CHLORIDE 0.9 % IV SOLN: 250.0000 mL | INTRAVENOUS | Status: DC | PRN

## 2018-03-14 MED ORDER — SODIUM CHLORIDE 0.9% FLUSH
10.0000 mL | Freq: Two times a day (BID) | INTRAVENOUS | Status: DC
  Administered 2018-03-14: 20 mL
  Administered 2018-03-15: 10 mL
  Administered 2018-03-15: 20 mL
  Administered 2018-03-16: 10 mL
  Administered 2018-03-16: 20 mL
  Administered 2018-03-17 – 2018-03-19 (×4): 10 mL

## 2018-03-14 MED ORDER — APIXABAN 5 MG PO TABS
5.0000 mg | ORAL_TABLET | Freq: Two times a day (BID) | ORAL | Status: DC
  Administered 2018-03-14 – 2018-03-19 (×10): 5 mg via ORAL
  Filled 2018-03-14 (×10): qty 1

## 2018-03-14 MED ORDER — PREDNISONE 50 MG PO TABS
50.0000 mg | ORAL_TABLET | Freq: Four times a day (QID) | ORAL | Status: AC
  Administered 2018-03-14 – 2018-03-15 (×3): 50 mg via ORAL
  Filled 2018-03-14 (×3): qty 1

## 2018-03-14 MED ORDER — SODIUM CHLORIDE 0.9% FLUSH: 10.0000 mL | INTRAVENOUS | Status: DC | PRN

## 2018-03-14 MED ORDER — ONDANSETRON HCL 4 MG/2ML IJ SOLN: 4.0000 mg | Freq: Four times a day (QID) | INTRAMUSCULAR | Status: DC | PRN

## 2018-03-14 MED ORDER — IPRATROPIUM-ALBUTEROL 0.5-2.5 (3) MG/3ML IN SOLN: 3.0000 mL | Freq: Four times a day (QID) | RESPIRATORY_TRACT | Status: DC

## 2018-03-14 MED ORDER — DIPHENHYDRAMINE HCL 25 MG PO CAPS
50.0000 mg | ORAL_CAPSULE | Freq: Once | ORAL | Status: AC
  Administered 2018-03-15: 50 mg via ORAL
  Filled 2018-03-14: qty 2

## 2018-03-14 MED ORDER — LEVALBUTEROL HCL 0.63 MG/3ML IN NEBU: 0.6300 mg | INHALATION_SOLUTION | Freq: Four times a day (QID) | RESPIRATORY_TRACT | Status: DC

## 2018-03-14 MED ORDER — LEVOTHYROXINE SODIUM 25 MCG PO TABS
25.0000 ug | ORAL_TABLET | Freq: Every day | ORAL | Status: DC
  Administered 2018-03-15 – 2018-03-19 (×5): 25 ug via ORAL
  Filled 2018-03-14 (×5): qty 1

## 2018-03-14 MED ORDER — AMIODARONE HCL 200 MG PO TABS
200.0000 mg | ORAL_TABLET | Freq: Two times a day (BID) | ORAL | Status: DC
  Administered 2018-03-14 – 2018-03-19 (×10): 200 mg via ORAL
  Filled 2018-03-14 (×10): qty 1

## 2018-03-14 MED ORDER — DIGOXIN 125 MCG PO TABS
0.1250 mg | ORAL_TABLET | Freq: Every day | ORAL | Status: DC
  Administered 2018-03-15 – 2018-03-19 (×5): 0.125 mg via ORAL
  Filled 2018-03-14 (×5): qty 1

## 2018-03-14 MED ORDER — ALBUTEROL SULFATE (2.5 MG/3ML) 0.083% IN NEBU
3.0000 mL | INHALATION_SOLUTION | Freq: Four times a day (QID) | RESPIRATORY_TRACT | Status: DC | PRN
  Administered 2018-03-14 – 2018-03-19 (×2): 3 mL via RESPIRATORY_TRACT
  Filled 2018-03-14 (×2): qty 3

## 2018-03-14 MED ORDER — BENZONATATE 100 MG PO CAPS: 100.0000 mg | ORAL_CAPSULE | Freq: Three times a day (TID) | ORAL | Status: DC | PRN

## 2018-03-14 MED ORDER — BISOPROLOL FUMARATE 5 MG PO TABS
2.5000 mg | ORAL_TABLET | Freq: Every day | ORAL | Status: DC
  Administered 2018-03-15 – 2018-03-19 (×5): 2.5 mg via ORAL
  Filled 2018-03-14 (×5): qty 1

## 2018-03-14 MED ORDER — DIPHENHYDRAMINE HCL 50 MG/ML IJ SOLN: 50.0000 mg | Freq: Once | INTRAMUSCULAR | Status: AC

## 2018-03-14 MED ORDER — AMIODARONE HCL 200 MG PO TABS: 400.0000 mg | ORAL_TABLET | Freq: Every day | ORAL | Status: DC

## 2018-03-14 MED ORDER — CARVEDILOL 6.25 MG PO TABS: 6.2500 mg | ORAL_TABLET | Freq: Two times a day (BID) | ORAL | Status: DC

## 2018-03-14 NOTE — Consult Note (Addendum)
Advanced Heart Failure Team Consult Note   Primary Physician: [name], MD PCP-Cardiologist:  Garwin Brothers, MD  Reason for Consultation: A/C systolic HF  HPI:    Carrie Walters is seen today for evaluation of A/C systolic HF at the request of Dr Allena Katz.   Brandan Glauber is a 62 y.o. female with a history of chronic atrial fibrillation, hypothyroidism, COPD, HTN, morbid obesity, VF arrest 02/21/18, and systolic heart failure due to NICM (diagnosed 02/2018)  Admitted 10/3-10/11/19. She had a VF arrest at [facility] on 02/21/18. She received CPR, 1 amp epi, and shock x1 with ROSC. She was then in Afib RVR and started on IV amio prior to transfer to [facility]. Echo showed newly reduced EF 25-30%. She had a LHC, which showed normal coronaries. EP was consulted and thought hypokalemia may have triggered VF arrest. She was transitioned to PO amio and given a LifeVest at DC. Unable to place ICD due to active leg wound. She required diuresis with IV lasix and then transitioned to lasix 40 mg daily. HF meds were optimized. DC weight 273 lbs  She has been having chills at home for 3 days. Occasional dry cough. No fever. She says she has been getting SOB after taking her medications. She has been having worsening SOB with exertion. She chronically sleeps sitting up. +BLE edema. Denies any CP or dizziness. She also has LUQ pain. No missed medications. Weight has gone up 2 lbs from DC. She does not have a scale at home.   She presented to [facility] today with worsening SOB and hypoxia to 80%. She was transferred to [facility] for further evaluation.    She feels better with oxygen. She has not received IV lasix yet today. No CP.   Pertinent admission labs include: Na 137, K 3.5, creatinine 1.13, troponin <0.03, BNP 99.7 (previously 334), WBC 12.4, hemoglobin 14.5. Respiratory panel pending. Cdiff panel pending  CXR: Minimal bibasilar subsegmental atelectasis or scarring.  Echo  02/22/18: EF 25-30%, diffuse HK, mild LVH  LHC 02/25/18: Normal coronaries, LVEDP 23. Unable to cannulate brachial vein for RHC  SH: former smoker, quit 30 years ago. No ETOH or drugs. Lives with her daughter in [place]. Has medicaid and uses medicaid transport. She is a retired Financial trader  FH: father with CAD  Review of Systems: [y] = yes, [ ]  = no   General: Weight gain [ ] ; Weight loss [ ] ; Anorexia [ ] ; Fatigue [ ] ; Fever [ ] ; Chills [ y]; Weakness [ ]   Cardiac: Chest pain/pressure [ ] ; Resting SOB [ ] ; Exertional SOB Cove.Etienne ]; Orthopnea Cove.Etienne ]; Pedal Edema [ y]; Palpitations [ ] ; Syncope [ ] ; Presyncope [ ] ; Paroxysmal nocturnal dyspnea[ ]   Pulmonary: Cough Cove.Etienne ]; Wheezing[ ] ; Hemoptysis[ ] ; Sputum [ ] ; Snoring [ ]   GI: Vomiting[ ] ; Dysphagia[ ] ; Melena[ ] ; Hematochezia [ ] ; Heartburn[ ] ; Abdominal pain Cove.Etienne ]; Constipation [ ] ; Diarrhea [ ] ; BRBPR [ ]   GU: Hematuria[ ] ; Dysuria [ ] ; Nocturia[ ]   Vascular: Pain in legs with walking [ ] ; Pain in feet with lying flat [ ] ; Non-healing sores [ ] ; Stroke [ ] ; TIA [ ] ; Slurred speech [ ] ;  Neuro: Headaches[ ] ; Vertigo[ ] ; Seizures[ ] ; Paresthesias[ ] ;Blurred vision [ ] ; Diplopia [ ] ; Vision changes [ ]   Ortho/Skin: Arthritis [ ] ; Joint pain [ ] ; Muscle pain [ ] ; Joint swelling [ ] ; Back Pain [ ] ; Rash [ ]   Psych: Depression[ ] ;  Anxiety[ ]   Heme: Bleeding problems [ ] ; Clotting disorders [ ] ; Anemia [ ]   Endocrine: Diabetes [ ] ; Thyroid dysfunction[ ]   Home Medications Prior to Admission medications   Medication Sig Start Date End Date Taking? Authorizing Provider  albuterol (PROVENTIL HFA;VENTOLIN HFA) 108 (90 Base) MCG/ACT inhaler Inhale 2 puffs into the lungs every 6 (six) hours as needed for wheezing or shortness of breath.    [provider]  amiodarone (PACERONE) 400 MG tablet Take 1 tablet (400 mg total) by mouth daily. Needs to see Dr. Elberta Fortis with TSH recheck prior to further refills 03/02/18   Manson Passey, PA  apixaban  (ELIQUIS) 5 MG TABS tablet Take 1 tablet (5 mg total) by mouth 2 (two) times daily. 03/01/18   Bhagat, Sharrell Ku, PA  carvedilol (COREG) 6.25 MG tablet Take 1 tablet (6.25 mg total) by mouth 2 (two) times daily with a meal. 03/01/18   Bhagat, Bhavinkumar, PA  digoxin (LANOXIN) 0.125 MG tablet Take 1 tablet (0.125 mg total) by mouth daily. 03/01/18   Bhagat, Sharrell Ku, PA  furosemide (LASIX) 40 MG tablet Take 1 tablet (40 mg total) by mouth daily. Take additional 1/2 tablet (20mg ) for edema or dyspnea. 03/01/18   Manson Passey, PA  levothyroxine (SYNTHROID, LEVOTHROID) 25 MCG tablet Take 1 tablet (25 mcg total) by mouth daily before breakfast. Needs PCP/cardiologist visit with TSH check for further refills 03/02/18   Bhagat, Bhavinkumar, PA  sacubitril-valsartan (ENTRESTO) 24-26 MG Take 1 tablet by mouth 2 (two) times daily. 03/01/18   Manson Passey, PA  spironolactone (ALDACTONE) 25 MG tablet Take 1 tablet (25 mg total) by mouth daily. 03/02/18   Manson Passey, PA    Past Medical History: Past Medical History:  Diagnosis Date  . Anxiety   . Arthritis   . Asthma   . Atrial fibrillation (HCC)   . Dysrhythmia   . Hypertension   . Hypothyroidism     Past Surgical History: Past Surgical History:  Procedure Laterality Date  . CHOLECYSTECTOMY    . LEFT HEART CATH AND CORONARY ANGIOGRAPHY N/A 02/25/2018   Procedure: LEFT HEART CATH AND CORONARY ANGIOGRAPHY;  Surgeon: [name], MD;  Location: [facility] INVASIVE CV LAB;  Service: Cardiovascular;  Laterality: N/A;  . UMBILICAL HERNIA REPAIR      Family History: Family History  Problem Relation Age of Onset  . Heart disease Father   . Atrial fibrillation Brother     Social History: Social History   Socioeconomic History  . Marital status: Single    Spouse name: Not on file  . Number of children: Not on file  . Years of education: Not on file  . Highest education level: Not on file  Occupational History  . Not  on file  Social Needs  . Financial resource strain: Not on file  . Food insecurity:    Worry: Not on file    Inability: Not on file  . Transportation needs:    Medical: Not on file    Non-medical: Not on file  Tobacco Use  . Smoking status: Former Games developer  . Smokeless tobacco: Never Used  Substance and Sexual Activity  . Alcohol use: Never    Frequency: Never  . Drug use: Never  . Sexual activity: Not on file  Lifestyle  . Physical activity:    Days per week: Not on file    Minutes per session: Not on file  . Stress: Not on file  Relationships  . Social connections:  Talks on phone: Not on file    Gets together: Not on file    Attends religious service: Not on file    Active member of club or organization: Not on file    Attends meetings of clubs or organizations: Not on file    Relationship status: Not on file  Other Topics Concern  . Not on file  Social History Narrative   Moved from [place]    Allergies:  Allergies  Allergen Reactions  . Metoprolol Tartrate Shortness Of Breath  . Biaxin [Clarithromycin] Other (See Comments)    Sores in mouth  . Contrast Media [Iodinated Diagnostic Agents] Rash    Objective:    Vital Signs:   Temp:  [97.8 F (36.6 C)] 97.8 F (36.6 C) (10/24 1110) Pulse Rate:  [91] 91 (10/24 1110) Resp:  [34] 34 (10/24 1110) BP: (126)/(84) 126/84 (10/24 1110) SpO2:  [98 %] 98 % (10/24 1110) Weight:  [125.1 kg] 125.1 kg (10/24 1110)    Weight change: Filed Weights   03/14/18 1110  Weight: 125.1 kg    Intake/Output:   Intake/Output Summary (Last 24 hours) at 03/14/2018 1341 Last data filed at 03/14/2018 1208 Gross per 24 hour  Intake -  Output 250 ml  Net -250 ml      Physical Exam    General:  Sitting in chair. Obese. No resp difficulty HEENT: normal Neck: supple. JVP difficult to assess. Carotids 2+ bilat; no bruits. No lymphadenopathy or thyromegaly appreciated. Cor: PMI nonpalpable. Irregular rate & rhythm. No  rubs, gallops or murmurs. Distant heart sounds Lungs: clear Abdomen: obese, soft, LUQ tender, +distension. No hepatosplenomegaly. No bruits or masses. Good bowel sounds. Extremities: no cyanosis, clubbing, rash, BLE 1+ edema, L>R with RLE wound wrapped Neuro: alert & orientedx3, cranial nerves grossly intact. moves all 4 extremities w/o difficulty. Affect pleasant   Telemetry   Aflutter 80-90s. Personally reviewed.   EKG    Aflutter 90. Personally reviewed.   Labs   Basic Metabolic Panel: Recent Labs  Lab 03/14/18 1154  NA 137  K 3.5  CL 100  CO2 28  GLUCOSE 142*  BUN 17  CREATININE 1.13*  CALCIUM 9.2  MG 2.0    Liver Function Tests: No results for input(s): AST, ALT, ALKPHOS, BILITOT, PROT, ALBUMIN in the last 168 hours. No results for input(s): LIPASE, AMYLASE in the last 168 hours. No results for input(s): AMMONIA in the last 168 hours.  CBC: Recent Labs  Lab 03/14/18 1154  WBC 12.4*  NEUTROABS 9.2*  HGB 14.5  HCT 48.4*  MCV 89.6  PLT 354    Cardiac Enzymes: Recent Labs  Lab 03/14/18 1154  TROPONINI <0.03    BNP: BNP (last 3 results) Recent Labs    02/21/18 2315 03/14/18 1154  BNP 334.5* 99.7    ProBNP (last 3 results) No results for input(s): PROBNP in the last 8760 hours.   CBG: No results for input(s): GLUCAP in the last 168 hours.  Coagulation Studies: No results for input(s): LABPROT, INR in the last 72 hours.   Imaging    No results found.   Medications:     Current Medications: . amiodarone  400 mg Oral Daily  . apixaban  5 mg Oral BID  . digoxin  0.125 mg Oral Daily  . diphenhydrAMINE  50 mg Oral Once   Or  . diphenhydrAMINE  50 mg Intravenous Once  . furosemide  40 mg Intravenous BID  . ipratropium  0.5 mg Nebulization Q6H  .  levalbuterol  0.63 mg Nebulization Q6H  . [START ON 03/15/2018] levothyroxine  25 mcg Oral QAC breakfast  . predniSONE  50 mg Oral Q6H  . sodium chloride flush  3 mL Intravenous Q12H      Infusions: . sodium chloride         Patient Profile   Doneta Bayman is a 62 y.o. female with a history of chronic atrial fibrillation, hypothyroidism, COPD, HTN, morbid obesity, VF arrest 02/21/18, and systolic heart failure due to NICM (diagnosed 02/2018).  Transferred from [facility] to [facility] with SOB and hypoxia.  Assessment/Plan   1. Acute on Chronic Systolic HF. NICM - Echo 02/22/18: EF 20-25%. Has LifeVest at home. - Volume status okay on exam. Weight up 2 lbs from DC weight. BNP only 99 (previously 330) - On IV lasix 40 mg BID. On lasix 40 mg daily at home. - Continue Entresto 24/26 mg BID - Continue spiro 25 mg daily - Continue coreg 6.25 mg BID - Continue digoxin 0.125 mg daily. Dig level 0.8 today (after taking). Check again tomorrow am prior to dose  2. Chronic Afib - HR 80-90s. - Continue Eliquis 5 mg BID - Continue amiodarone 400 mg daily  3. VF arrest 02/21/18 - Has LifeVest at home - EP following outpatient for possible ICD. May not be possible with chronic leg wounds (most recent wound has been present for 4 months)  4. COPD/Acute hypoxic respiratory failure - Being treated with prednisone for ?COPD exacerbation - Does not wear O2 at home. Stable sats on 2L - Quit smoking 30 years ago  5. HTN - Well controlled today  6. Obesity - Body mass index is 44.51 kg/m.   7. BLE wounds - WOC RN following  Medication concerns reviewed with patient and pharmacy team. Barriers identified: none at this time.   Length of Stay: 0  Alford Highland, NP  03/14/2018, 1:41 PM  Advanced Heart Failure Team Pager [phone] (M-F; 7a - 4p)  Please contact [facility] for night-coverage after hours (4p -7a ) and weekends on [email or web address]  Patient was re-admitted with dyspnea, lightheadedness, and LUQ pain.  She was recently discharged from prolonged hospitalization with vfib arrest and CHF.  CXR shows atelectasis, ECG shows chronic atrial fibrillation.   She has quite extensive lower leg venous stasis changes with ulcerations.    On exam, JVP difficult but appears volume overloaded.  1+ edema to knees with lower leg erythema and ulcerations.  Distant BS with wheezing.  Regular S1S2, no murmur.   1. Dyspnea:  There is concern for COPD exacerbation, she is wheezing.  I also suspect volume overload though exam is difficult.  CXR does not show PNA.  She is currently hypotensive so will hold off on aggressive Lasix today.  No fever.  - She is on prednisone for COPD per primary service.   - Suspect she will need Lasix, but SBP in 70s currently so will hold off for now.  I will place PICC line to assess CVP given difficult exam.  2. Hypotension: SBP in 70s currently, has had lightheadedness at home.  No fever, WBCs mildly elevated.  She is on Coreg and Entresto at home, Sherryll Burger may be the cause of her hypotension.  - Would send blood cultures.  - Hold Entresto, Coreg, and Lasix for today, reassess tomorrow.  3. Acute on chronic systolic CHF: Nonischemic cardiomyopathy (cath in 10/19 without significant disease).  Echo in 10/19 with EF 25-30%.  As above, volume difficult  on exam but suspect volume overload. BP low as above, ?due to [facility] versus septic picture.   - Place PICC and follow CVP to get better idea of volume status.  - Hold Entresto, Coreg, and Lasix for now.  Would hold off on IV fluid as long as she is not lightheaded.  If CVP is high and BP better tomorrow, will restart IV Lasix.  - When BP more stable, would start her on bisoprolol rather than Coreg given beta-1 selectivity. - She can continue spironolactone and digoxin for now.   - Will eventually need ICD (not CRT candidate) given recent VT arrest.  Needs healing of leg ulcers first.  4. Atrial fibrillation:  Persistent, not sure how long.  She has had atrial fibrillation for up to 5 years, but not sure if it comes and goes or is chronic.  She is on amiodarone and Eliquis.  I think it  would be reasonable to stabilize her then make an attempt to get her back into NSR.  - After respiratory status is improved (not wheezing), will arrange for TEE-guided DCCV (possibly Monday).   5. COPD: Suspect COPD exacerbation.  Per primary service.  She no longer smokes.   6. VF arrest: In 10/19.  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  Have been holding off on ICD until legs heal, which will likely take a while.  Therefore, think we can go ahead and try to cardiovert her out of atrial fibrillation (will need uninterrupted anticoagulation for 4 wks afterwards).   7. Lower leg ulcerations: Suspect venous stasis changes.   - Wound consult.  - Will get peripheral arterial dopplers to rule out PAD contribution.  8. LUQ pain: Plan for CT per primary service.   Marca Ancona 03/14/2018 4:12 PM

## 2018-03-14 NOTE — Consult Note (Signed)
WOC Nurse wound consult note Reason for Consult: wound care Patient seen by this WOC nurse just a few weeks ago for LEs. Patient is followed by someone in [place] for wound care per patient. She has previously had a HHRN but does not currently. She changes her dressings at home herself. I have explained the need for compression for venous stasis dx and she in the past refused to have compression wraps and report the stockings to be "too painful". She has agreed that she would allow for an ABI to be performed to determine the safety for compression.  We have further discussed the need for long term compression to keep her legs ulcer free and that the standard of care for the current ulcerations would be topical treatment alongside of compression therapy. I will request the attending MD to order ABIs if they are in agreement. Wound type: venous stasis bilateral LEs  Pressure Injury POA: NA Measurement:  3 small ulcerations on the RLE posterior calf; aprox 1cm x 1cm x 0.1cm  3 larger ulcerations on the LLE lateral calf and posterior calf.  I am not able to measure them at this time, the patient is in the chair which allows for her to "breathe better" Wound bed: mostly clean, granular tissue noted on the LLE wounds, does not appear to be infected  Drainage (amount, consistency, odor) minimal at the time of my assessment. The patient self reports that some days the drainage is more than others.  Reports she has been spending all of her time in her recliner and her legs are much worse when she is not in the bed which of course further supports the need for compression therapy. Periwound: venous stasis dermatitis with hemosiderin staining and skin changes related to venous stasis. Dressing procedure/placement/frequency: Silver hydrofiber to the open ulcerations of the LLE, cover with ABD pads, secure with kerlix.Change every other day and PRN strike though Silicone foam to the RLE wounds, change every 3 days  and PRN saturation.  Will ask attending to order ABIs for possible compression therapy. And if performed will follow up for treatment.   Carrie Walters [facility], CNS, [employer] [phone]

## 2018-03-14 NOTE — Care Management Note (Signed)
Case Management Note  Patient Details  Name: Carrie Walters MRN: [id number] Date of Birth: [birth year]   Subjective/Objective:  Pt is a readmit - this admit pt had chest pressure                   Action/Plan:  PTA from home active with [facility] for [facility] only (PT performed eval post last discharge and did not continue PT per [facility]).  Pt also has life vest - liaison made aware of pts hospitalization.  CM will continue to follow for discharge needs   Expected Discharge Date:                  Expected Discharge Plan:  Home w Home Health Services  In-House Referral:     Discharge planning Services  CM Consult  Post Acute Care Choice:  Resumption of Svcs/PTA Provider Choice offered to:     DME Arranged:    DME Agency:  [facility]  HH Arranged:  RN [facility] Agency:  [facility]  Status of Service:     If discussed at Long Length of Stay Meetings, dates discussed:    Additional Comments:  Cherylann Parr, RN 03/14/2018, 1:12 PM

## 2018-03-14 NOTE — Progress Notes (Signed)
Called to find out if patient was able to have PICC placed at this time.  Patient currently out of bed in chair per Herbert Seta, RN.  It will be a little while before she is able to assist patient back to bed.  VAST will follow up later and place PICC tonight once in bed.  Gasper Lloyd, RN VAST

## 2018-03-14 NOTE — Evaluation (Signed)
Physical Therapy Evaluation Patient Details Name: Carrie Walters MRN: [id number] DOB: 1955/07/27 Today's Date: 03/14/2018   History of Present Illness  Pt adm from [facility] with acute on chronic systolic heart failure. Pt with life vest at home since recent DC. PMH - chronic atrial fibrillation, hypothyroidism, COPD, HTN, morbid obesity, VF arrest 02/21/18, and systolic heart failure due to NICM (diagnosed 02/2018)  Clinical Impression  Pt well known to me from last admission. Expect she will do well with mobility and be able to return home. Recommended rollator for home use last time and pt reports she didn't receive one. Will follow up.     Follow Up Recommendations Home health PT    Equipment Recommendations  Other (comment)(bariatric rollator)    Recommendations for Other Services       Precautions / Restrictions Precautions Precautions: None Restrictions Weight Bearing Restrictions: No      Mobility  Bed Mobility Overal bed mobility: Modified Independent                Transfers Overall transfer level: Needs assistance Equipment used: Rolling walker (2 wheeled) Transfers: Sit to/from Stand;Stand Pivot Transfers Sit to Stand: Min guard Stand pivot transfers: Min guard       General transfer comment: Assist for safety  Ambulation/Gait Ambulation/Gait assistance: Min guard Gait Distance (Feet): 10 Feet Assistive device: Rolling walker (2 wheeled) Gait Pattern/deviations: Step-through pattern;Decreased stride length Gait velocity: decr Gait velocity interpretation: 1.31 - 2.62 ft/sec, indicative of limited community ambulator General Gait Details: Assist for safety and line management.  Stairs            Wheelchair Mobility    Modified Rankin (Stroke Patients Only)       Balance Overall balance assessment: Needs assistance Sitting-balance support: No upper extremity supported;Feet supported Sitting balance-Leahy Scale: Good      Standing balance support: No upper extremity supported;During functional activity Standing balance-Leahy Scale: Fair                               Pertinent Vitals/Pain Pain Assessment: No/denies pain    Home Living Family/patient expects to be discharged to:: Private residence Living Arrangements: Children;Non-relatives/Friends Available Help at Discharge: Family;Available 24 hours/day Type of Home: Mobile home Home Access: Stairs to enter Entrance Stairs-Rails: Right Entrance Stairs-Number of Steps: 3-4 Home Layout: One level Home Equipment: Cane - single point      Prior Function Level of Independence: Independent with assistive device(s)         Comments: Uses cane at times     Hand Dominance        Extremity/Trunk Assessment   Upper Extremity Assessment Upper Extremity Assessment: Overall WFL for tasks assessed    Lower Extremity Assessment Lower Extremity Assessment: Generalized weakness       Communication   Communication: No difficulties  Cognition Arousal/Alertness: Awake/alert Behavior During Therapy: WFL for tasks assessed/performed Overall Cognitive Status: Within Functional Limits for tasks assessed                                        General Comments      Exercises     Assessment/Plan    PT Assessment Patient needs continued PT services  PT Problem List Decreased strength;Decreased activity tolerance;Decreased balance;Decreased mobility;Decreased knowledge of use of DME;Obesity       PT Treatment  Interventions DME instruction;Gait training;Stair training;Functional mobility training;Therapeutic activities;Therapeutic exercise;Balance training;Patient/family education    PT Goals (Current goals can be found in the Care Plan section)  Acute Rehab PT Goals Patient Stated Goal: return home PT Goal Formulation: With patient Time For Goal Achievement: 03/28/18 Potential to Achieve Goals: Good     Frequency Min 3X/week   Barriers to discharge Inaccessible home environment stairs to enter    Co-evaluation               AM-PAC PT "6 Clicks" Daily Activity  Outcome Measure Difficulty turning over in bed (including adjusting bedclothes, sheets and blankets)?: None Difficulty moving from lying on back to sitting on the side of the bed? : None Difficulty sitting down on and standing up from a chair with arms (e.g., wheelchair, bedside commode, etc,.)?: A Little Help needed moving to and from a bed to chair (including a wheelchair)?: A Little Help needed walking in hospital room?: A Little Help needed climbing 3-5 steps with a railing? : A Little 6 Click Score: 20    End of Session Equipment Utilized During Treatment: Oxygen Activity Tolerance: Patient tolerated treatment well Patient left: in chair;with call bell/phone within reach Nurse Communication: Mobility status PT Visit Diagnosis: Other abnormalities of gait and mobility (R26.89);Muscle weakness (generalized) (M62.81)    Time: 1140-1155 PT Time Calculation (min) (ACUTE ONLY): 15 min   Charges:   PT Evaluation $PT Eval Low Complexity: 1 Low          [facility] PT Acute Rehabilitation Services Pager [phone] Office [phone]   Angelina Ok [facility] 03/14/2018, 4:52 PM

## 2018-03-14 NOTE — H&P (Signed)
[facility] History and Physical   Patient: Carrie Walters ZOX:[id number]   PCP: [name], MD DOB: [birth year]   DOA: 03/14/2018   DOS: 03/14/2018   DOS: the patient was seen and examined on 03/14/2018  Patient coming from: The patient is coming from home.  Chief Complaint: Shortness of breath and abdominal pain and diarrhea  HPI: Carrie Walters is a 62 y.o. female with Past medical history of a flutter, hypothyroidism, COPD, HTN, morbid obesity, recent cardiac arrest. The patient comes to the hospital with multiple complaints.  She is a transfer from [facility] where she presented with complaints of shortness of breath and left upper quadrant pain.  She denies any nausea. no Vomiting.  She denies not have any chest pain.  She reports that she has chronic diarrhea but recently has more pain and more diarrhea.  Denies any change in the stool,  No blood in the stool.  No fever reported.  But increasing cough over last few days.  She mentions this is compliant with all her medicines no pain in the leg.  She mentioned that she has some wheezing.  ED Course: ED provider at [facility] requested the patient be transferred to [facility] for cardiology evaluation.  At her baseline ambulates with support And is independent for most of her ADL; manages her medication on her own.  Review of Systems: as mentioned in the history of present illness.  All other systems reviewed and are negative.  Past Medical History:  Diagnosis Date  . Anxiety   . Arthritis   . Asthma   . Atrial fibrillation (HCC)   . Dysrhythmia   . Hypertension   . Hypothyroidism    Past Surgical History:  Procedure Laterality Date  . CHOLECYSTECTOMY    . LEFT HEART CATH AND CORONARY ANGIOGRAPHY N/A 02/25/2018   Procedure: LEFT HEART CATH AND CORONARY ANGIOGRAPHY;  Surgeon: [name], MD;  Location: [facility] INVASIVE CV LAB;  Service: Cardiovascular;  Laterality: N/A;  . UMBILICAL HERNIA REPAIR      Social History:  reports that she has quit smoking. She has never used smokeless tobacco. She reports that she does not drink alcohol or use drugs.  Allergies  Allergen Reactions  . Metoprolol Tartrate Shortness Of Breath  . Biaxin [Clarithromycin] Other (See Comments)    Sores in mouth  . Levaquin [Levofloxacin] Other (See Comments)    Sores in mouth  . Contrast Media [Iodinated Diagnostic Agents] Rash   Family History  Problem Relation Age of Onset  . Heart disease Father   . Atrial fibrillation Brother      Prior to Admission medications   Medication Sig Start Date End Date Taking? Authorizing Provider  albuterol (PROVENTIL HFA;VENTOLIN HFA) 108 (90 Base) MCG/ACT inhaler Inhale 2 puffs into the lungs every 6 (six) hours as needed for wheezing or shortness of breath.   Yes [provider]  amiodarone (PACERONE) 400 MG tablet Take 1 tablet (400 mg total) by mouth daily. Needs to see Dr. Elberta Fortis with TSH recheck prior to further refills 03/02/18  Yes Bhagat, Bhavinkumar, PA  apixaban (ELIQUIS) 5 MG TABS tablet Take 1 tablet (5 mg total) by mouth 2 (two) times daily. 03/01/18  Yes Bhagat, Bhavinkumar, PA  carvedilol (COREG) 6.25 MG tablet Take 1 tablet (6.25 mg total) by mouth 2 (two) times daily with a meal. 03/01/18  Yes Bhagat, Bhavinkumar, PA  digoxin (LANOXIN) 0.125 MG tablet Take 1 tablet (0.125 mg total) by mouth daily. 03/01/18  Yes Bhagat, Bhavinkumar, PA  furosemide (LASIX) 40 MG tablet Take 1 tablet (40 mg total) by mouth daily. Take additional 1/2 tablet (20mg ) for edema or dyspnea. 03/01/18  Yes Bhagat, Bhavinkumar, PA  levothyroxine (SYNTHROID, LEVOTHROID) 25 MCG tablet Take 1 tablet (25 mcg total) by mouth daily before breakfast. Needs PCP/cardiologist visit with TSH check for further refills 03/02/18  Yes Bhagat, Bhavinkumar, PA  sacubitril-valsartan (ENTRESTO) 24-26 MG Take 1 tablet by mouth 2 (two) times daily. 03/01/18  Yes Bhagat, Bhavinkumar, PA    spironolactone (ALDACTONE) 25 MG tablet Take 1 tablet (25 mg total) by mouth daily. 03/02/18  Yes Bhagat, Sharrell Ku, PA    Physical Exam: Vitals:   03/14/18 1110 03/14/18 1200  BP: 126/84 (!) 99/55  Pulse: 91 92  Resp: (!) 34 (!) 23  Temp: 97.8 F (36.6 C)   TempSrc: Oral   SpO2: 98% 100%  Weight: 125.1 kg   Height: 5\' 6"  (1.676 m)     General: Alert, Awake and Oriented to Time, Place and Person. Appear in moderate distress, affect appropriate Eyes: PERRL, Conjunctiva normal ENT: Oral Mucosa clear moist. Neck: positive JVD, no Abnormal Mass Or lumps Cardiovascular: S1 and S2 Present, no Murmur, Peripheral Pulses Present Respiratory: increased respiratory effort, Bilateral Air entry equal and Decreased, no use of accessory muscle, bilateral basal Crackles, bilateral  wheezes Abdomen: Bowel Sound present, Soft and no tenderness, no hernia Skin: no redness, no Rash, no induration Extremities: bilateral Pedal edema, no calf tenderness Neurologic: Grossly no focal neuro deficit. Bilaterally Equal motor strength  Labs on Admission:  CBC: Recent Labs  Lab 03/14/18 1154  WBC 12.4*  NEUTROABS 9.2*  HGB 14.5  HCT 48.4*  MCV 89.6  PLT 354   Basic Metabolic Panel: Recent Labs  Lab 03/14/18 1154  NA 137  K 3.5  CL 100  CO2 28  GLUCOSE 142*  BUN 17  CREATININE 1.13*  CALCIUM 9.2  MG 2.0   GFR: Estimated Creatinine Clearance: 69.8 mL/min (A) (by C-G formula based on SCr of 1.13 mg/dL (H)). Liver Function Tests: No results for input(s): AST, ALT, ALKPHOS, BILITOT, PROT, ALBUMIN in the last 168 hours. No results for input(s): LIPASE, AMYLASE in the last 168 hours. No results for input(s): AMMONIA in the last 168 hours. Coagulation Profile: No results for input(s): INR, PROTIME in the last 168 hours. Cardiac Enzymes: Recent Labs  Lab 03/14/18 1154  TROPONINI <0.03   BNP (last 3 results) No results for input(s): PROBNP in the last 8760 hours. HbA1C: No results  for input(s): HGBA1C in the last 72 hours. CBG: No results for input(s): GLUCAP in the last 168 hours. Lipid Profile: No results for input(s): CHOL, HDL, LDLCALC, TRIG, CHOLHDL, LDLDIRECT in the last 72 hours. Thyroid Function Tests: No results for input(s): TSH, T4TOTAL, FREET4, T3FREE, THYROIDAB in the last 72 hours. Anemia Panel: No results for input(s): VITAMINB12, FOLATE, FERRITIN, TIBC, IRON, RETICCTPCT in the last 72 hours. Urine analysis: No results found for: COLORURINE, APPEARANCEUR, LABSPEC, PHURINE, GLUCOSEU, HGBUR, BILIRUBINUR, KETONESUR, PROTEINUR, UROBILINOGEN, NITRITE, LEUKOCYTESUR  Radiological Exams on Admission: Dg Chest Port 1 View  Result Date: 03/14/2018 CLINICAL DATA:  Shortness of breath. EXAM: PORTABLE CHEST 1 VIEW COMPARISON:  None. FINDINGS: The heart size and mediastinal contours are within normal limits. No pneumothorax or pleural effusion is noted. Minimal bibasilar subsegmental atelectasis or scarring is noted. The visualized skeletal structures are unremarkable. IMPRESSION: Minimal bibasilar subsegmental atelectasis or scarring. Electronically Signed   By: Lupita Raider, M.D.  On: 03/14/2018 14:07   EKG: Independently reviewed. normal sinus rhythm, nonspecific ST and T waves changes.  Assessment/Plan 1. Acute on chronic combined systolic and diastolic CHF (congestive heart failure) (HCC) Patient presents with complaints of shortness of breath as well as cough and wheezing. Chest x-ray shows basilar atelectasis. Patient did receive Lasix in the ER. Transfer to [facility] for cardiology evaluation. For now I am holding off on all her antihypertensive medication as well as Aldactone and Entresto given her hypotension. We will follow-up on recommendation cardiology regarding management of heart failure.  2.  Hypotension. Blood pressure is running in the lower side likely secondary to poor p.o. intake as well as use of antihypertensive  medications. Currently holding all medication.  Given of 500 cc bolus. Monitor. Do not suspect that the patient actually has a major active infection but we will initiate sepsis protocol.  3.  Cough and shortness of breath. Bilateral expiratory wheezing. History of COPD and asthma. Chest x-ray negative for any pneumonia. Checking sepsis protocol. DuoNeb's. Check respiratory virus pathogen panel.  4.  Left upper quadrant abdominal pain. Diarrhea. Patient has diarrhea ongoing for last few days.  She reports that she has on and off diarrhea ongoing for last many years though since her gallbladder surgery. Reports 3-4 BM without any blood. Checking C. difficile. Order CT abdomen pelvis with contrast, patient does have history of contrast allergy and therefore will receive prednisone before that.  5.  Chronic atrial flutter. Still in atrial flutter but rate controlled. Continue digoxin. Continue amiodarone. Continue Eliquis.  6.  Hypothyroidism. Continue Synthroid.  7.Body mass index is 44.51 kg/m.  Morbid obesity. Suspect the patient actually has obesity hypoventilation syndrome. For now monitor.  Nutrition: cardiac diet DVT Prophylaxis: on therapeutic anticoagulation.  Advance goals of care discussion: Full code  Consults: Cardiology  Family Communication: no family was present at bedside, at the time of interview.  Opportunity was given to ask question and all questions were answered satisfactorily.  Disposition: Admitted as inpatient, step-down unit. Likely to be discharged home, in 3 days.  Severity of Illness: The appropriate patient status for this patient is INPATIENT. Inpatient status is judged to be reasonable and necessary in order to provide the required intensity of service to ensure the patient's safety. The patient's presenting symptoms, physical exam findings, and initial radiographic and laboratory data in the context of their chronic comorbidities is felt  to place them at high risk for further clinical deterioration. Furthermore, it is not anticipated that the patient will be medically stable for discharge from the hospital within 2 midnights of admission. The following factors support the patient status of inpatient.   " The patient's presenting symptoms include dizziness, shortness of breath, abdominal pain and diarrhea. " The worrisome physical exam findings include hypotension, edema, bilateral wheezing. " The initial radiographic and laboratory data are worrisome because of vascular congestion. " The chronic co-morbidities include chronic CHF.   * I certify that at the point of admission it is my clinical judgment that the patient will require inpatient hospital care spanning beyond 2 midnights from the point of admission due to high intensity of service, high risk for further deterioration and high frequency of surveillance required.*    Author: Lynden Oxford, MD [facility] 03/14/2018  If 7PM-7AM, please contact night-coverage [email or web address]

## 2018-03-14 NOTE — Progress Notes (Signed)
[facility]  Patient Status: Active (receiving services up to time of hospitalization)  [facility] is providing the following services: RN  If patient discharges after hours, please call [phone].   Carrie Walters 03/14/2018, 1:15 PM

## 2018-03-14 NOTE — Progress Notes (Signed)
Peripherally Inserted Central Catheter/Midline Placement  The IV Nurse has discussed with the patient and/or persons authorized to consent for the patient, the purpose of this procedure and the potential benefits and risks involved with this procedure.  The benefits include less needle sticks, lab draws from the catheter, and the patient may be discharged home with the catheter. Risks include, but not limited to, infection, bleeding, blood clot (thrombus formation), and puncture of an artery; nerve damage and irregular heartbeat and possibility to perform a PICC exchange if needed/ordered by physician.  Alternatives to this procedure were also discussed.  Bard Power PICC patient education guide, fact sheet on infection prevention and patient information card has been provided to patient /or left at bedside.    PICC/Midline Placement Documentation  PICC Double Lumen 03/14/18 PICC Right Brachial 42 cm 0 cm (Active)  Indication for Insertion or Continuance of Line Chronic illness with exacerbations (CF, Sickle Cell, etc.) 03/14/2018  9:28 PM  Exposed Catheter (cm) 0 cm 03/14/2018  9:28 PM  Site Assessment Clean;Dry;Intact 03/14/2018  9:28 PM  Lumen #1 Status Flushed;Saline locked;Blood return noted 03/14/2018  9:28 PM  Lumen #2 Status Flushed;Saline locked;Blood return noted 03/14/2018  9:28 PM  Dressing Type Transparent 03/14/2018  9:28 PM  Dressing Status Clean;Dry;Intact;Antimicrobial disc in place 03/14/2018  9:28 PM  Dressing Intervention New dressing 03/14/2018  9:28 PM  Dressing Change Due 03/21/18 03/14/2018  9:28 PM       Fielding Mault, Lajean Manes 03/14/2018, 9:29 PM

## 2018-03-14 NOTE — Progress Notes (Signed)
Spoke with Tammy, RN regarding patient and ordered PICC.  Per Tammy patient's blood pressure has been running 70-80s.  She will need to wait to have PICC placed.  [facility] will follow up and place if patient's sBP is >90 sustained later in evening.  Gasper Lloyd, RN VAST

## 2018-03-15 ENCOUNTER — Inpatient Hospital Stay (HOSPITAL_COMMUNITY): Payer: Medicaid Other

## 2018-03-15 DIAGNOSIS — I4892 Unspecified atrial flutter: Secondary | ICD-10-CM

## 2018-03-15 DIAGNOSIS — L0390 Cellulitis, unspecified: Secondary | ICD-10-CM

## 2018-03-15 DIAGNOSIS — I739 Peripheral vascular disease, unspecified: Secondary | ICD-10-CM

## 2018-03-15 LAB — BASIC METABOLIC PANEL
ANION GAP: 7 (ref 5–15)
BUN: 15 mg/dL (ref 8–23)
CHLORIDE: 102 mmol/L (ref 98–111)
CO2: 27 mmol/L (ref 22–32)
Calcium: 9.2 mg/dL (ref 8.9–10.3)
Creatinine, Ser: 0.95 mg/dL (ref 0.44–1.00)
GFR calc Af Amer: >60 mL/min (ref >60)
GLUCOSE: 230 mg/dL — AB (ref 70–99)
POTASSIUM: 4.4 mmol/L (ref 3.5–5.1)
Sodium: 136 mmol/L (ref 135–145)

## 2018-03-15 LAB — CBC
HEMATOCRIT: 46.6 % — AB (ref 36.0–46.0)
HEMOGLOBIN: 14.4 g/dL (ref 12.0–15.0)
MCH: 27.5 pg (ref 26.0–34.0)
MCHC: 30.9 g/dL (ref 30.0–36.0)
MCV: 88.9 fL (ref 80.0–100.0)
Platelets: 305 10*3/uL (ref 150–400)
RBC: 5.24 MIL/uL — ABNORMAL HIGH (ref 3.87–5.11)
RDW: 16.1 % — ABNORMAL HIGH (ref 11.5–15.5)
WBC: 8.3 10*3/uL (ref 4.0–10.5)
nRBC: 0 % (ref 0.0–0.2)

## 2018-03-15 LAB — TROPONIN I: Troponin I: <0.03 ng/mL (ref <0.03)

## 2018-03-15 LAB — COOXEMETRY PANEL
Carboxyhemoglobin: 1.8 % — ABNORMAL HIGH (ref 0.5–1.5)
Methemoglobin: 1 % (ref 0.0–1.5)
O2 Saturation: 72.3 %
TOTAL HEMOGLOBIN: 15 g/dL (ref 12.0–16.0)

## 2018-03-15 MED ORDER — SODIUM CHLORIDE 0.9% FLUSH: 3.0000 mL | INTRAVENOUS | Status: DC | PRN

## 2018-03-15 MED ORDER — SODIUM CHLORIDE 0.9% FLUSH
3.0000 mL | Freq: Two times a day (BID) | INTRAVENOUS | Status: DC
  Administered 2018-03-15 – 2018-03-18 (×4): 3 mL via INTRAVENOUS

## 2018-03-15 MED ORDER — FUROSEMIDE 40 MG PO TABS
40.0000 mg | ORAL_TABLET | Freq: Every day | ORAL | Status: DC
  Administered 2018-03-15 – 2018-03-17 (×3): 40 mg via ORAL
  Filled 2018-03-15 (×3): qty 1

## 2018-03-15 MED ORDER — SODIUM CHLORIDE 0.9 % IV SOLN: 250.0000 mL | INTRAVENOUS | Status: DC

## 2018-03-15 MED ORDER — PREDNISONE 20 MG PO TABS
30.0000 mg | ORAL_TABLET | Freq: Every day | ORAL | Status: DC
  Administered 2018-03-16: 30 mg via ORAL
  Filled 2018-03-15: qty 1

## 2018-03-15 MED ORDER — SPIRONOLACTONE 12.5 MG HALF TABLET
12.5000 mg | ORAL_TABLET | Freq: Every day | ORAL | Status: DC
  Administered 2018-03-15 – 2018-03-18 (×4): 12.5 mg via ORAL
  Filled 2018-03-15 (×5): qty 1

## 2018-03-15 MED ORDER — IOPAMIDOL (ISOVUE-300) INJECTION 61%
100.0000 mL | Freq: Once | INTRAVENOUS | Status: AC | PRN
  Administered 2018-03-15: 100 mL via INTRAVENOUS

## 2018-03-15 NOTE — Progress Notes (Signed)
PROGRESS NOTE                                                                                                                                                                                                             Patient Demographics:    Carrie Walters, is a 62 y.o. female, DOB - [birth year], OMB:[id number]  Admit date - 03/14/2018   Admitting Physician Lorretta Harp, MD  Outpatient Primary MD for the patient is [name], MD  LOS - 1   No chief complaint on file.      Brief Narrative    62 y.o. female with Past medical history of a flutter, hypothyroidism, COPD, HTN, morbid obesity, recent cardiac arrest. The patient comes to the hospital with multiple complaints.  She is a transfer from [facility] where she presented with complaints of shortness of breath and left upper quadrant pain, work-up significant for multiple exacerbation, acute on chronic systolic CHF.   Subjective:    Carrie Walters today has, No headache, No chest pain, No abdominal pain -denies any further diarrhea, reports his dyspnea significantly improved  Assessment  & Plan :    Principal Problem:   Acute on chronic combined systolic and diastolic CHF (congestive heart failure) (HCC) Active Problems:   Hypothyroidism   Atrial flutter (HCC)   Morbid obesity (HCC)   Pressure injury of skin   Diarrhea   Left upper quadrant pain  Acute on Chronic Systolic HF. NICM -Most recent echo 02/22/2018 with EF 20 to 25%, she does have a lot of rest at home. -Treatment per CHF team, paresis per them, CVP 7-8 this morning, she was transitioned to oral 6. -Entresto and Aldactone currently on hold in the setting of low blood pressure, -She is on Coreg, digoxin,.  Chronic A. Fib -Not rate controlled, anticoagulation Eliquis 5 mg twice daily, on amiodarone 200 mg twice daily for heart rate control, per cardiology note plan for TEE/DCCV 03/18/2018  Left upper quadrant pain with  diarrhea -She denies any further diarrhea, -CT abdomen pelvis pending  History of VF arrest 22,019 -She has LifeVest at home, along with EP as an outpatient - Has LifeVest at home - EP following outpatient for possible ICD.May not be possible with chronic leg wounds (most recent wound has been present for 4 months)  COPD exacerbation -Wheezing significantly improved after  receiving prednisone yesterday, will continue with slow taper  Hypertension -Low on admission, soft overnight, currently stable  Lower extremity wound -Wound care consulted -Follow on ABI ordered by cardiology  Hypothyroidism. - Continue Synthroid.  Code Status : Full code  Family Communication  : None at bedside  Disposition Plan  : Home when stable, pending PT consult  Consults  : Cardiology/CHF  Procedures  : None  DVT Prophylaxis  : Eliquis  Lab Results  Component Value Date   PLT 305 03/15/2018    Antibiotics  :    Anti-infectives (From admission, onward)   None        Objective:   Vitals:   03/14/18 2355 03/15/18 0258 03/15/18 0300 03/15/18 0738  BP:  126/71  128/84  Pulse:  87  83  Resp:  (!) 24  (!) 25  Temp:  97.6 F (36.4 C)  97.7 F (36.5 C)  TempSrc:  Oral  Oral  SpO2: 99% 97%  98%  Weight:   125.2 kg   Height:        Wt Readings from Last 3 Encounters:  03/15/18 125.2 kg  03/01/18 123.9 kg  01/14/18 132.9 kg     Intake/Output Summary (Last 24 hours) at 03/15/2018 1111 Last data filed at 03/15/2018 0920 Gross per 24 hour  Intake 20 ml  Output 1850 ml  Net -1830 ml     Physical Exam  Awake Alert, Oriented X 3, Symmetrical Chest wall movement, Good air movement bilaterally, CTAB ,No Gallops,Rubs or new Murmurs, No Parasternal Heave +ve B.Sounds, Abd Soft, tenderness could be appreciated on my left upper quadrant today,  No rebound - guarding or rigidity. No Cyanosis, Clubbing, +1 edema, right lower extremity skin changes, left lower extremity  bandaged   Data Review:    CBC Recent Labs  Lab 03/14/18 1154 03/15/18 0815  WBC 12.4* 8.3  HGB 14.5 14.4  HCT 48.4* 46.6*  PLT 354 305  MCV 89.6 88.9  MCH 26.9 27.5  MCHC 30.0 30.9  RDW 16.5* 16.1*  LYMPHSABS 2.1  --   MONOABS 0.7  --   EOSABS 0.2  --   BASOSABS 0.1  --     Chemistries  Recent Labs  Lab 03/14/18 1154 03/15/18 0455  NA 137 136  K 3.5 4.4  CL 100 102  CO2 28 27  GLUCOSE 142* 230*  BUN 17 15  CREATININE 1.13* 0.95  CALCIUM 9.2 9.2  MG 2.0  --    ------------------------------------------------------------------------------------------------------------------ No results for input(s): CHOL, HDL, LDLCALC, TRIG, CHOLHDL, LDLDIRECT in the last 72 hours.  No results found for: HGBA1C ------------------------------------------------------------------------------------------------------------------ No results for input(s): TSH, T4TOTAL, T3FREE, THYROIDAB in the last 72 hours.  Invalid input(s): FREET3 ------------------------------------------------------------------------------------------------------------------ No results for input(s): VITAMINB12, FOLATE, FERRITIN, TIBC, IRON, RETICCTPCT in the last 72 hours.  Coagulation profile Recent Labs  Lab 03/14/18 1813  INR 1.13    No results for input(s): DDIMER in the last 72 hours.  Cardiac Enzymes Recent Labs  Lab 03/14/18 1154 03/14/18 1813 03/14/18 2255  TROPONINI <0.03 <0.03 <0.03   ------------------------------------------------------------------------------------------------------------------    Component Value Date/Time   BNP 99.7 03/14/2018 1154    Inpatient Medications  Scheduled Meds: . amiodarone  200 mg Oral BID  . apixaban  5 mg Oral BID  . bisoprolol  2.5 mg Oral Daily  . digoxin  0.125 mg Oral Daily  . levothyroxine  25 mcg Oral QAC breakfast  . [START ON 03/16/2018] predniSONE  30 mg Oral Q  breakfast  . sodium chloride flush  10-40 mL Intracatheter Q12H  . sodium  chloride flush  3 mL Intravenous Q12H  . sodium chloride flush  3 mL Intravenous Q12H   Continuous Infusions: . sodium chloride    . sodium chloride     PRN Meds:.sodium chloride, acetaminophen, albuterol, benzonatate, ondansetron (ZOFRAN) IV, sodium chloride flush, sodium chloride flush, sodium chloride flush  Micro Results Recent Results (from the past 240 hour(s))  MRSA PCR Screening     Status: None   Collection Time: 03/14/18  3:12 PM  Result Value Ref Range Status   MRSA by PCR NEGATIVE NEGATIVE Final    Comment:        The GeneXpert MRSA Assay (FDA approved for NASAL specimens only), is one component of a comprehensive MRSA colonization surveillance program. It is not intended to diagnose MRSA infection nor to guide or monitor treatment for MRSA infections. Performed at [facility], [address] [place], [place] [address]   Respiratory Panel by PCR     Status: None   Collection Time: 03/14/18  3:12 PM  Result Value Ref Range Status   Adenovirus NOT DETECTED NOT DETECTED Final   Coronavirus 229E NOT DETECTED NOT DETECTED Final   Coronavirus HKU1 NOT DETECTED NOT DETECTED Final   Coronavirus NL63 NOT DETECTED NOT DETECTED Final   Coronavirus OC43 NOT DETECTED NOT DETECTED Final   Metapneumovirus NOT DETECTED NOT DETECTED Final   Rhinovirus / Enterovirus NOT DETECTED NOT DETECTED Final   Influenza A NOT DETECTED NOT DETECTED Final   Influenza B NOT DETECTED NOT DETECTED Final   Parainfluenza Virus 1 NOT DETECTED NOT DETECTED Final   Parainfluenza Virus 2 NOT DETECTED NOT DETECTED Final   Parainfluenza Virus 3 NOT DETECTED NOT DETECTED Final   Parainfluenza Virus 4 NOT DETECTED NOT DETECTED Final   Respiratory Syncytial Virus NOT DETECTED NOT DETECTED Final   Bordetella pertussis NOT DETECTED NOT DETECTED Final   Chlamydophila pneumoniae NOT DETECTED NOT DETECTED Final   Mycoplasma pneumoniae NOT DETECTED NOT DETECTED Final    Comment: Performed at [facility], [address], [place], [place] [address]  Culture, blood (x 2)     Status: None (Preliminary result)   Collection Time: 03/14/18  6:09 PM  Result Value Ref Range Status   Specimen Description BLOOD RIGHT ARM  Final   Special Requests   Final    BOTTLES DRAWN AEROBIC ONLY Blood Culture results may not be optimal due to an inadequate volume of blood received in culture bottles   Culture   Final    NO GROWTH < 12 HOURS Performed at [facility], [address], [place], [place] [address]    Report Status PENDING  Incomplete  Culture, blood (x 2)     Status: None (Preliminary result)   Collection Time: 03/14/18  6:13 PM  Result Value Ref Range Status   Specimen Description BLOOD RIGHT FOREARM  Final   Special Requests   Final    BOTTLES DRAWN AEROBIC ONLY Blood Culture adequate volume   Culture   Final    NO GROWTH < 12 HOURS Performed at [facility], [address], [place], [place] [address]    Report Status PENDING  Incomplete    Radiology Reports Dg Chest Port 1 View  Result Date: 03/14/2018 CLINICAL DATA:  PICC line placement EXAM: PORTABLE CHEST 1 VIEW COMPARISON:  03/14/2018 FINDINGS: Cardiomegaly with vascular congestion. Bibasilar atelectasis. Mild interstitial  prominence could reflect early interstitial edema. Right PICC line is in place with the tip in the SVC. IMPRESSION: Cardiomegaly with vascular congestion and probable early interstitial edema. Bibasilar atelectasis. Electronically Signed   By: Charlett Nose M.D.   On: 03/14/2018 22:16   Dg Chest Port 1 View  Result Date: 03/14/2018 CLINICAL DATA:  Shortness of breath. EXAM: PORTABLE CHEST 1 VIEW COMPARISON:  None. FINDINGS: The heart size and mediastinal contours are within normal limits. No pneumothorax or pleural effusion is noted. Minimal bibasilar subsegmental atelectasis or scarring is noted. The visualized skeletal structures are unremarkable. IMPRESSION: Minimal bibasilar subsegmental  atelectasis or scarring. Electronically Signed   By: Lupita Raider, M.D.   On: 03/14/2018 14:07   [place] Ekg Site Rite  Result Date: 03/14/2018 If Site Rite image not attached, placement could not be confirmed due to current cardiac rhythm.    Huey Bienenstock M.D on 03/15/2018 at 11:11 AM  Between 7am to 7pm - Pager - [phone]  After 7pm go to [email or web address] - password [facility]  [facility] -  Office  [phone]

## 2018-03-15 NOTE — Progress Notes (Addendum)
Advanced Heart Failure Rounding Note  PCP-Cardiologist: Garwin Brothers, MD   Subjective:    Feeling OK this am. Denies SOB. Legs feeling OK this am. Still   Negative 1.1 L though weight shows up 1 lb. Cr stable at 0.95. K 4.4  Objective:   Weight Range: 125.2 kg Body mass index is 44.56 kg/m.   Vital Signs:   Temp:  [97.4 F (36.3 C)-97.8 F (36.6 C)] 97.7 F (36.5 C) (10/25 0738) Pulse Rate:  [55-95] 83 (10/25 0738) Resp:  [19-34] 25 (10/25 0738) BP: (74-128)/(45-85) 128/84 (10/25 0738) SpO2:  [96 %-100 %] 98 % (10/25 0738) Weight:  [125.1 kg-125.2 kg] 125.2 kg (10/25 0300) Last BM Date: 03/14/18  Weight change: Filed Weights   03/14/18 1110 03/15/18 0300  Weight: 125.1 kg 125.2 kg    Intake/Output:   Intake/Output Summary (Last 24 hours) at 03/15/2018 0821 Last data filed at 03/15/2018 0752 Gross per 24 hour  Intake 20 ml  Output 1500 ml  Net -1480 ml      Physical Exam    General:  Obese. NAD.  HEENT: Normal Neck: Supple. JVP difficult. Carotids 2+ bilat; no bruits. No lymphadenopathy or thyromegaly appreciated. Cor: PMI non-palpable.  Irregularly irregular. No rubs, gallops or murmurs. Lungs: Clear Abdomen: Obese, LUQ tender, + distended. No hepatosplenomegaly. No bruits or masses. Good bowel sounds. Extremities: No cyanosis, clubbing, or rash. BLE 1+ edema, L>R with RLE wound covered.  Neuro: Alert & orientedx3, cranial nerves grossly intact. moves all 4 extremities w/o difficulty. Affect pleasant   Telemetry   Aflutter 80-90s, personally reviewed.   EKG    No new tracings.    Labs    CBC Recent Labs    03/14/18 1154  WBC 12.4*  NEUTROABS 9.2*  HGB 14.5  HCT 48.4*  MCV 89.6  PLT 354   Basic Metabolic Panel Recent Labs    16/10/96 1154 03/15/18 0455  NA 137 136  K 3.5 4.4  CL 100 102  CO2 28 27  GLUCOSE 142* 230*  BUN 17 15  CREATININE 1.13* 0.95  CALCIUM 9.2 9.2  MG 2.0  --    Liver Function Tests No results  for input(s): AST, ALT, ALKPHOS, BILITOT, PROT, ALBUMIN in the last 72 hours. No results for input(s): LIPASE, AMYLASE in the last 72 hours. Cardiac Enzymes Recent Labs    03/14/18 1154 03/14/18 1813 03/14/18 2255  TROPONINI <0.03 <0.03 <0.03    BNP: BNP (last 3 results) Recent Labs    02/21/18 2315 03/14/18 1154  BNP 334.5* 99.7    ProBNP (last 3 results) No results for input(s): PROBNP in the last 8760 hours.   D-Dimer No results for input(s): DDIMER in the last 72 hours. Hemoglobin A1C No results for input(s): HGBA1C in the last 72 hours. Fasting Lipid Panel No results for input(s): CHOL, HDL, LDLCALC, TRIG, CHOLHDL, LDLDIRECT in the last 72 hours. Thyroid Function Tests No results for input(s): TSH, T4TOTAL, T3FREE, THYROIDAB in the last 72 hours.  Invalid input(s): FREET3  Other results:   Imaging    Dg Chest Port 1 View  Result Date: 03/14/2018 CLINICAL DATA:  PICC line placement EXAM: PORTABLE CHEST 1 VIEW COMPARISON:  03/14/2018 FINDINGS: Cardiomegaly with vascular congestion. Bibasilar atelectasis. Mild interstitial prominence could reflect early interstitial edema. Right PICC line is in place with the tip in the SVC. IMPRESSION: Cardiomegaly with vascular congestion and probable early interstitial edema. Bibasilar atelectasis. Electronically Signed   By: Charlett Nose M.D.  On: 03/14/2018 22:16   Dg Chest Port 1 View  Result Date: 03/14/2018 CLINICAL DATA:  Shortness of breath. EXAM: PORTABLE CHEST 1 VIEW COMPARISON:  None. FINDINGS: The heart size and mediastinal contours are within normal limits. No pneumothorax or pleural effusion is noted. Minimal bibasilar subsegmental atelectasis or scarring is noted. The visualized skeletal structures are unremarkable. IMPRESSION: Minimal bibasilar subsegmental atelectasis or scarring. Electronically Signed   By: Lupita Raider, M.D.   On: 03/14/2018 14:07   [place] Ekg Site Rite  Result Date: 03/14/2018 If Site Rite  image not attached, placement could not be confirmed due to current cardiac rhythm.     Medications:     Scheduled Medications: . amiodarone  200 mg Oral BID  . apixaban  5 mg Oral BID  . bisoprolol  2.5 mg Oral Daily  . digoxin  0.125 mg Oral Daily  . levothyroxine  25 mcg Oral QAC breakfast  . sodium chloride flush  10-40 mL Intracatheter Q12H  . sodium chloride flush  3 mL Intravenous Q12H     Infusions: . sodium chloride       PRN Medications:  sodium chloride, acetaminophen, albuterol, benzonatate, ondansetron (ZOFRAN) IV, sodium chloride flush, sodium chloride flush    Patient Profile   Carrie Walters is a 62 y.o. female with a history of chronic atrial fibrillation, hypothyroidism, COPD, HTN, morbid obesity, VF arrest 02/21/18, and systolic heart failure due to NICM (diagnosed 02/2018)  Transferred from [facility] to [facility] with SOB and hypoxia.  Assessment/Plan   1. Acute on Chronic Systolic HF. NICM - Echo 02/22/18: EF 20-25%. Has LifeVest at home. - Volume status looks OK on exam.  CVP 7-8 this am.  - Coox pending.  - Transition back to po lasix.  - Entresto and spiro held.  - Continue coreg 6.25 mg BID - Continue digoxin 0.125 mg daily. Dig level 0.8 03/14/18 (after taking). Check tomorrow prior to dosing.   2. Chronic Afib - Rate controlled.  - Continue Eliquis 5 mg BID - Continue amiodarone 200 mg BID.  - Will plan for TEE/DCCV 03/18/18 at 1400.  3. VF arrest 02/21/18 - Has LifeVest at home - EP following outpatient for possible ICD. May not be possible with chronic leg wounds (most recent wound has been present for 4 months)  4. COPD/Acute hypoxic respiratory failure - Being treated with prednisone for ?COPD exacerbation - Stable on 2L. Not previously on O2 at home.  - Quit smoking 30 years ago.  5. HTN - Soft overnight. Stable in 120s this am.   6. Obesity - Body mass index is 44.56 kg/m.   7. BLE wounds - WOC RN  following. - Peripheral arterial dopplers ordered.   8. LUQ pain - CT abdomen pelvis pending.   Medication concerns reviewed with patient and pharmacy team. Barriers identified: None at this time.   Length of Stay: 1  Carrie Walters  03/15/2018, 8:21 AM  Advanced Heart Failure Team Pager [phone] (M-F; 7a - 4p)  Please contact [facility] for night-coverage after hours (4p -7a ) and weekends on [email or web address]  Patient seen with PA, agree with the above note.   With PICC line in place, CVP is 7-8 with co-ox 72%.  She is wheezing less today.   On exam, JVP difficult, 1+ ankle edema, irregular S1S2  1. Dyspnea:  Improving today.  Exam is difficult for volume but CVP is not elevated (7-8).  Possible COPD exacerbation.   - Can  restart home po Lasix.  - Steroids per primary service.  2. Hypotension: BP improved now off Entresto.  Doubt sepsis.  - Would continue to hold Entresto, may be able to start low dose losartan.   3. Acute on chronic systolic CHF: Nonischemic cardiomyopathy (cath in 10/19 without significant disease).  Echo in 10/19 with EF 25-30%.  As above, volume difficult on exam but CVP not elevated. BP low at admission, possibly due to [facility]. Co-ox 72% off PICC. - Stopped Coreg and switched to bisoprolol given beta-1 selectivity. - She can continue spironolactone and digoxin.   - Restart home Lasix 40 mg daily.  - Will eventually need ICD (not CRT candidate) given recent VT arrest.  Needs healing of leg ulcers first.  4. Atrial fibrillation:  Persistent, not sure how long.  She has had atrial fibrillation for up to 5 years, but not sure if it comes and goes or is chronic.  She is on amiodarone and Eliquis.  I think it would be reasonable to make an attempt to get her back into NSR.  - Arrange for TEE-guided DCCV Monday.    5. COPD: Suspect COPD exacerbation.  Per primary service.  She no longer smokes.   6. VF arrest: In 10/19.  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   Have been holding off on ICD until legs heal, which will likely take a while.  Therefore, think we can go ahead and try to cardiovert her out of atrial fibrillation (will need uninterrupted anticoagulation for 4 wks afterwards).   7. Lower leg ulcerations: Suspect venous stasis changes.   - Wound consult.  - Peripheral arterial dopplers did not show significant disease.  8. LUQ pain: CT unremarkable.   Carrie Walters 03/15/2018 5:42 PM

## 2018-03-15 NOTE — Progress Notes (Signed)
VASCULAR LAB PRELIMINARY  ARTERIAL  ABI completed:    RIGHT    LEFT    PRESSURE WAVEFORM  PRESSURE WAVEFORM  BRACHIAL Picc line Triphasic BRACHIAL 132 Triphasic  DP 188 Triphasic DP 1881.27 Triphasic  PT 167  PT 171 Triphasic    RIGHT LEFT  ABI 1.27 1.42   Doppler waveforms are within normal limits bilaterally at rest. ABI on the right is normal at rest. Left ABI indicates a possible mild calcification of the arteries.  Dyron Kawano, RVS 03/15/2018, 5:05 PM

## 2018-03-15 NOTE — Progress Notes (Signed)
Physical Therapy Treatment Patient Details Name: Carrie Walters MRN: [id number] DOB: Apr 14, 1956 Today's Date: 03/15/2018    History of Present Illness Pt adm from [facility] with acute on chronic systolic heart failure. Pt with life vest at home since recent DC. PMH - chronic atrial fibrillation, hypothyroidism, COPD, HTN, morbid obesity, VF arrest 02/21/18, and systolic heart failure due to NICM (diagnosed 02/2018)    PT Comments    Progressing well with mobility.    Follow Up Recommendations  Home health PT     Equipment Recommendations  Other (comment)(bariatric rollator)    Recommendations for Other Services       Precautions / Restrictions Precautions Precautions: None Restrictions Weight Bearing Restrictions: No    Mobility  Bed Mobility Overal bed mobility: Modified Independent             General bed mobility comments: Incr time and use of rail  Transfers Overall transfer level: Needs assistance Equipment used: Rolling walker (2 wheeled) Transfers: Sit to/from Stand;Stand Pivot Transfers Sit to Stand: Supervision         General transfer comment: Assist for safety and line management  Ambulation/Gait Ambulation/Gait assistance: Supervision Gait Distance (Feet): 115 Feet Assistive device: 4-wheeled walker;None Gait Pattern/deviations: Step-through pattern;Decreased stride length Gait velocity: decr Gait velocity interpretation: 1.31 - 2.62 ft/sec, indicative of limited community ambulator General Gait Details: Assist for safety and line management. Amb into bathroom without assistive device. Pt amb on RA with SpO2>89%.    Stairs             Wheelchair Mobility    Modified Rankin (Stroke Patients Only)       Balance Overall balance assessment: Needs assistance Sitting-balance support: No upper extremity supported;Feet supported Sitting balance-Leahy Scale: Good     Standing balance support: No upper extremity supported;During  functional activity Standing balance-Leahy Scale: Fair                              Cognition Arousal/Alertness: Awake/alert Behavior During Therapy: WFL for tasks assessed/performed Overall Cognitive Status: Within Functional Limits for tasks assessed                                        Exercises      General Comments        Pertinent Vitals/Pain Pain Assessment: No/denies pain    Home Living                      Prior Function            PT Goals (current goals can now be found in the care plan section) Progress towards PT goals: Progressing toward goals    Frequency    Min 3X/week      PT Plan Current plan remains appropriate    Co-evaluation              AM-PAC PT "6 Clicks" Daily Activity  Outcome Measure  Difficulty turning over in bed (including adjusting bedclothes, sheets and blankets)?: A Little Difficulty moving from lying on back to sitting on the side of the bed? : None Difficulty sitting down on and standing up from a chair with arms (e.g., wheelchair, bedside commode, etc,.)?: A Little Help needed moving to and from a bed to chair (including a wheelchair)?: A Little Help needed walking in hospital  room?: A Little Help needed climbing 3-5 steps with a railing? : A Little 6 Click Score: 19    End of Session   Activity Tolerance: Patient tolerated treatment well Patient left: in chair;with call bell/phone within reach Nurse Communication: Mobility status;Other (comment)(left O2 off with pt at 94% while sitting) PT Visit Diagnosis: Other abnormalities of gait and mobility (R26.89);Muscle weakness (generalized) (M62.81)     Time: 7510-2585 PT Time Calculation (min) (ACUTE ONLY): 15 min  Charges:  $Gait Training: 8-22 mins                     [facility] PT Acute Rehabilitation Services Pager [phone] Office [phone]    Angelina Ok [facility] 03/15/2018, 1:40 PM

## 2018-03-16 LAB — COOXEMETRY PANEL
CARBOXYHEMOGLOBIN: 1.5 % (ref 0.5–1.5)
METHEMOGLOBIN: 1 % (ref 0.0–1.5)
O2 Saturation: 78.5 %
Total hemoglobin: 13.3 g/dL (ref 12.0–16.0)

## 2018-03-16 LAB — BASIC METABOLIC PANEL
ANION GAP: 9 (ref 5–15)
BUN: 16 mg/dL (ref 8–23)
CO2: 29 mmol/L (ref 22–32)
Calcium: 9.5 mg/dL (ref 8.9–10.3)
Chloride: 102 mmol/L (ref 98–111)
Creatinine, Ser: 0.86 mg/dL (ref 0.44–1.00)
GFR calc Af Amer: >60 mL/min (ref >60)
GLUCOSE: 154 mg/dL — AB (ref 70–99)
POTASSIUM: 4.3 mmol/L (ref 3.5–5.1)
Sodium: 140 mmol/L (ref 135–145)

## 2018-03-16 LAB — DIGOXIN LEVEL: DIGOXIN LVL: 0.6 ng/mL — AB (ref 0.8–2.0)

## 2018-03-16 MED ORDER — PREDNISONE 20 MG PO TABS
20.0000 mg | ORAL_TABLET | Freq: Every day | ORAL | Status: DC
  Administered 2018-03-17 – 2018-03-19 (×3): 20 mg via ORAL
  Filled 2018-03-16 (×3): qty 1

## 2018-03-16 MED ORDER — PANTOPRAZOLE SODIUM 40 MG PO TBEC
40.0000 mg | DELAYED_RELEASE_TABLET | Freq: Every day | ORAL | Status: DC
  Administered 2018-03-16 – 2018-03-19 (×4): 40 mg via ORAL
  Filled 2018-03-16 (×4): qty 1

## 2018-03-16 NOTE — Progress Notes (Addendum)
Advanced Heart Failure Rounding Note  PCP-Cardiologist: Garwin Brothers, MD   Subjective:    Back on po diuretics. Diuresing modestly. Weight unchanged. Co-ox 78%. CVP 8-9.  Creatinine stable  Being treated for COPD flare. Denies orthopnea or PND. No wheezing    Objective:   Weight Range: 125.3 kg Body mass index is 44.59 kg/m.   Vital Signs:   Temp:  [97.5 F (36.4 C)-98.5 F (36.9 C)] 98.5 F (36.9 C) (10/26 1540) Pulse Rate:  [63-85] 67 (10/26 1540) Resp:  [15-25] 23 (10/26 1540) BP: (82-115)/(54-85) 100/75 (10/26 1540) SpO2:  [95 %-100 %] 96 % (10/26 1540) Weight:  [125.3 kg] 125.3 kg (10/26 0254) Last BM Date: 03/15/18  Weight change: Filed Weights   03/14/18 1110 03/15/18 0300 03/16/18 0254  Weight: 125.1 kg 125.2 kg 125.3 kg    Intake/Output:   Intake/Output Summary (Last 24 hours) at 03/16/2018 1608 Last data filed at 03/16/2018 0900 Gross per 24 hour  Intake 1080 ml  Output 1225 ml  Net -145 ml      Physical Exam    General:  Obese woman. Sitting in chair. No resp difficulty HEENT: normal Neck: supple. JVP hard to see with size Carotids 2+ bilat; no bruits. No lymphadenopathy or thryomegaly appreciated. Cor: PMI nondisplaced. Irregular rate & rhythm. No rubs, gallops or murmurs. Lungs: clear no wheezing Abdomen: obese soft, nontender, nondistended. No hepatosplenomegaly. No bruits or masses. Good bowel sounds. Extremities: no cyanosis, clubbing, rash,  LLE wrapped. No edema on R Neuro: alert & orientedx3, cranial nerves grossly intact. moves all 4 extremities w/o difficulty. Affect pleasant   Telemetry   Afib 70-80s, personally reviewed.   EKG    No new tracings.    Labs    CBC Recent Labs    03/14/18 1154 03/15/18 0815  WBC 12.4* 8.3  NEUTROABS 9.2*  --   HGB 14.5 14.4  HCT 48.4* 46.6*  MCV 89.6 88.9  PLT 354 305   Basic Metabolic Panel Recent Labs    13/08/65 1154 03/15/18 0455 03/16/18 0459  NA 137 136 140  K  3.5 4.4 4.3  CL 100 102 102  CO2 28 27 29   GLUCOSE 142* 230* 154*  BUN 17 15 16   CREATININE 1.13* 0.95 0.86  CALCIUM 9.2 9.2 9.5  MG 2.0  --   --    Liver Function Tests No results for input(s): AST, ALT, ALKPHOS, BILITOT, PROT, ALBUMIN in the last 72 hours. No results for input(s): LIPASE, AMYLASE in the last 72 hours. Cardiac Enzymes Recent Labs    03/14/18 1154 03/14/18 1813 03/14/18 2255  TROPONINI <0.03 <0.03 <0.03    BNP: BNP (last 3 results) Recent Labs    02/21/18 2315 03/14/18 1154  BNP 334.5* 99.7    ProBNP (last 3 results) No results for input(s): PROBNP in the last 8760 hours.   D-Dimer No results for input(s): DDIMER in the last 72 hours. Hemoglobin A1C No results for input(s): HGBA1C in the last 72 hours. Fasting Lipid Panel No results for input(s): CHOL, HDL, LDLCALC, TRIG, CHOLHDL, LDLDIRECT in the last 72 hours. Thyroid Function Tests No results for input(s): TSH, T4TOTAL, T3FREE, THYROIDAB in the last 72 hours.  Invalid input(s): FREET3  Other results:   Imaging    No results found.   Medications:     Scheduled Medications: . amiodarone  200 mg Oral BID  . apixaban  5 mg Oral BID  . bisoprolol  2.5 mg Oral Daily  .  digoxin  0.125 mg Oral Daily  . furosemide  40 mg Oral Daily  . levothyroxine  25 mcg Oral QAC breakfast  . pantoprazole  40 mg Oral Daily  . [START ON 03/17/2018] predniSONE  20 mg Oral Q breakfast  . sodium chloride flush  10-40 mL Intracatheter Q12H  . sodium chloride flush  3 mL Intravenous Q12H  . sodium chloride flush  3 mL Intravenous Q12H  . spironolactone  12.5 mg Oral Daily    Infusions: . sodium chloride    . sodium chloride      PRN Medications: sodium chloride, acetaminophen, albuterol, benzonatate, ondansetron (ZOFRAN) IV, sodium chloride flush, sodium chloride flush, sodium chloride flush    Patient Profile   Carrie Walters is a 62 y.o. female with a history of chronic atrial fibrillation,  hypothyroidism, COPD, HTN, morbid obesity, VF arrest 02/21/18, and systolic heart failure due to NICM (diagnosed 02/2018)  Transferred from [facility] to [facility] with SOB and hypoxia.  Assessment/Plan   1. Acute on Chronic Systolic HF. NICM - Echo 02/22/18: EF 20-25%. Has LifeVest at home. - Volume status looks good on exam.  CVP 8-9 this am.  - Coox 79% - Continue po lasix. Can switch to torsemide as needed.  - Entresto and spiro held.  - Continue bisoprolol (b-1 selective) - Continue digoxin 0.125 mg daily. Dig level 0.6 - Will add low-dose spiro back - Consider losartan as BP tolerates  2. Chronic Afib - Rate controlled.  - Continue Eliquis 5 mg BID - Continue amiodarone 200 mg BID.  - Has been arrangedfor TEE/DCCV 03/18/18 at 1400.  3. VF arrest 02/21/18 - Has LifeVest at home - EP following outpatient for possible ICD. May not be possible with chronic leg wounds (most recent wound has been present for 4 months)  4. COPD/Acute hypoxic respiratory failure - Being treated with prednisone for ?COPD exacerbation - Stable on 2L. Not previously on O2 at home.  - Quit smoking 30 years ago.  5. HTN - Soft overnight. Stable in 120s this am.   6. Obesity - Body mass index is 44.59 kg/m.   7. BLE wounds - WOC RN following. - Peripheral arterial dopplers ordered.   8. LUQ pain - Resolved. CT abdomen pelvis unremarkable   Medication concerns reviewed with patient and pharmacy team. Barriers identified: None at this time.   Length of Stay: 2  Arvilla Meres, MD  03/16/2018, 4:08 PM  Advanced Heart Failure Team Pager [phone] (M-F; 7a - 4p)  Please contact [facility] for night-coverage after hours (4p -7a ) and weekends on [email or web address]

## 2018-03-16 NOTE — Plan of Care (Signed)

## 2018-03-16 NOTE — Progress Notes (Signed)
PROGRESS NOTE                                                                                                                                                                                                             Patient Demographics:    Carrie Walters, is a 62 y.o. female, DOB - 06/15/1955, EBX:[id number]  Admit date - 03/14/2018   Admitting Physician Lorretta Harp, MD  Outpatient Primary MD for the patient is [name], MD  LOS - 2   No chief complaint on file.      Brief Narrative    62 y.o. female with Past medical history of a flutter, hypothyroidism, COPD, HTN, morbid obesity, recent cardiac arrest. The patient comes to the hospital with multiple complaints.  She is a transfer from [facility] where she presented with complaints of shortness of breath and left upper quadrant pain, work-up significant for multiple exacerbation, acute on chronic systolic CHF.   Subjective:    Carrie Walters today has, No headache, No chest pain, No abdominal pain , no diarrhea, dyspnea has improved a Principal Problem:   Acute on chronic combined systolic and diastolic CHF (congestive heart failure) (HCC) Active Problems:   Hypothyroidism   Atrial flutter (HCC)   Morbid obesity (HCC)   Pressure injury of skin   Diarrhea   Left upper quadrant pain  Acute on Chronic Systolic HF. NICM -Most recent echo 02/22/2018 with EF 20 to 25%, she does have a lot of rest at home. -Creases and volume management per CHF team, he is currently on oral Lasix, renal function remained stable , -Entresto and Aldactone currently on hold in the setting of low blood pressure, -She is on Coreg, digoxin,.  Chronic A. Fib -Not rate controlled, anticoagulation Eliquis 5 mg twice daily, on amiodarone 200 mg twice daily for heart rate control, per cardiology note plan for TEE/DCCV 03/18/2018  Left upper quadrant pain with diarrhea -There is no acute finding on CT abdomen  pelvis to explain her pain, she has nonobstructive renal stone in the left kidneys, she was encouraged to use incentive spirometry, as it was felt that this will be contributing to this. -Diarrhea has resolved  History of VF arrest 22,019 -She has LifeVest at home, along with EP as an outpatient - Has LifeVest at home - EP following outpatient for possible  ICD.May not be possible with chronic leg wounds (most recent wound has been present for 4 months)  COPD exacerbation -Wheezing resolved, will taper prednisone further to 20 mg oral daily .  Hypertension -Low on admission, soft overnight, currently stable  Lower extremity wound -Wound care consulted  Hypothyroidism. - Continue Synthroid.  Code Status : Full code  Family Communication  : None at bedside  Disposition Plan  : Home after her TEE/DCCV on Monday  Consults  : Cardiology/CHF  Procedures  : None  DVT Prophylaxis  : Eliquis  Lab Results  Component Value Date   PLT 305 03/15/2018    Antibiotics  :    Anti-infectives (From admission, onward)   None        Objective:   Vitals:   03/15/18 2100 03/15/18 2326 03/16/18 0254 03/16/18 0716  BP:  113/77 94/67 115/85  Pulse: 85 74 73 81  Resp: (!) 23 20 (!) 21 15  Temp:  98.3 F (36.8 C) (!) 97.5 F (36.4 C) 97.7 F (36.5 C)  TempSrc:  Oral Oral Oral  SpO2: 97% 98% 100% 95%  Weight:   125.3 kg   Height:        Wt Readings from Last 3 Encounters:  03/16/18 125.3 kg  03/01/18 123.9 kg  01/14/18 132.9 kg     Intake/Output Summary (Last 24 hours) at 03/16/2018 1106 Last data filed at 03/16/2018 0900 Gross per 24 hour  Intake 1440 ml  Output 1225 ml  Net 215 ml     Physical Exam  Awake Alert, Oriented X 3, No new F.N deficits, Normal affect Symmetrical Chest wall movement, Good air movement bilaterally, CTAB RRR,No Gallops,Rubs or new Murmurs, No Parasternal Heave +ve B.Sounds, Abd Soft, No tenderness, No rebound - guarding or rigidity. No  Cyanosis, Clubbing, +1 edema, right lower extremity skin changes, left lower extremity bandaged   Data Review:    CBC Recent Labs  Lab 03/14/18 1154 03/15/18 0815  WBC 12.4* 8.3  HGB 14.5 14.4  HCT 48.4* 46.6*  PLT 354 305  MCV 89.6 88.9  MCH 26.9 27.5  MCHC 30.0 30.9  RDW 16.5* 16.1*  LYMPHSABS 2.1  --   MONOABS 0.7  --   EOSABS 0.2  --   BASOSABS 0.1  --     Chemistries  Recent Labs  Lab 03/14/18 1154 03/15/18 0455 03/16/18 0459  NA 137 136 140  K 3.5 4.4 4.3  CL 100 102 102  CO2 28 27 29   GLUCOSE 142* 230* 154*  BUN 17 15 16   CREATININE 1.13* 0.95 0.86  CALCIUM 9.2 9.2 9.5  MG 2.0  --   --    ------------------------------------------------------------------------------------------------------------------ No results for input(s): CHOL, HDL, LDLCALC, TRIG, CHOLHDL, LDLDIRECT in the last 72 hours.  No results found for: HGBA1C ------------------------------------------------------------------------------------------------------------------ No results for input(s): TSH, T4TOTAL, T3FREE, THYROIDAB in the last 72 hours.  Invalid input(s): FREET3 ------------------------------------------------------------------------------------------------------------------ No results for input(s): VITAMINB12, FOLATE, FERRITIN, TIBC, IRON, RETICCTPCT in the last 72 hours.  Coagulation profile Recent Labs  Lab 03/14/18 1813  INR 1.13    No results for input(s): DDIMER in the last 72 hours.  Cardiac Enzymes Recent Labs  Lab 03/14/18 1154 03/14/18 1813 03/14/18 2255  TROPONINI <0.03 <0.03 <0.03   ------------------------------------------------------------------------------------------------------------------    Component Value Date/Time   BNP 99.7 03/14/2018 1154    Inpatient Medications  Scheduled Meds: . amiodarone  200 mg Oral BID  . apixaban  5 mg Oral BID  . bisoprolol  2.5 mg Oral Daily  . digoxin  0.125 mg Oral Daily  . furosemide  40 mg Oral Daily    . levothyroxine  25 mcg Oral QAC breakfast  . predniSONE  30 mg Oral Q breakfast  . sodium chloride flush  10-40 mL Intracatheter Q12H  . sodium chloride flush  3 mL Intravenous Q12H  . sodium chloride flush  3 mL Intravenous Q12H  . spironolactone  12.5 mg Oral Daily   Continuous Infusions: . sodium chloride    . sodium chloride     PRN Meds:.sodium chloride, acetaminophen, albuterol, benzonatate, ondansetron (ZOFRAN) IV, sodium chloride flush, sodium chloride flush, sodium chloride flush  Micro Results Recent Results (from the past 240 hour(s))  MRSA PCR Screening     Status: None   Collection Time: 03/14/18  3:12 PM  Result Value Ref Range Status   MRSA by PCR NEGATIVE NEGATIVE Final    Comment:        The GeneXpert MRSA Assay (FDA approved for NASAL specimens only), is one component of a comprehensive MRSA colonization surveillance program. It is not intended to diagnose MRSA infection nor to guide or monitor treatment for MRSA infections. Performed at [facility], [address], [place], [place] [address]   Respiratory Panel by PCR     Status: None   Collection Time: 03/14/18  3:12 PM  Result Value Ref Range Status   Adenovirus NOT DETECTED NOT DETECTED Final   Coronavirus 229E NOT DETECTED NOT DETECTED Final   Coronavirus HKU1 NOT DETECTED NOT DETECTED Final   Coronavirus NL63 NOT DETECTED NOT DETECTED Final   Coronavirus OC43 NOT DETECTED NOT DETECTED Final   Metapneumovirus NOT DETECTED NOT DETECTED Final   Rhinovirus / Enterovirus NOT DETECTED NOT DETECTED Final   Influenza A NOT DETECTED NOT DETECTED Final   Influenza B NOT DETECTED NOT DETECTED Final   Parainfluenza Virus 1 NOT DETECTED NOT DETECTED Final   Parainfluenza Virus 2 NOT DETECTED NOT DETECTED Final   Parainfluenza Virus 3 NOT DETECTED NOT DETECTED Final   Parainfluenza Virus 4 NOT DETECTED NOT DETECTED Final   Respiratory Syncytial Virus NOT DETECTED NOT DETECTED Final   Bordetella  pertussis NOT DETECTED NOT DETECTED Final   Chlamydophila pneumoniae NOT DETECTED NOT DETECTED Final   Mycoplasma pneumoniae NOT DETECTED NOT DETECTED Final    Comment: Performed at [facility], [address], [place], [place] [address]  Culture, blood (x 2)     Status: None (Preliminary result)   Collection Time: 03/14/18  6:09 PM  Result Value Ref Range Status   Specimen Description BLOOD RIGHT ARM  Final   Special Requests   Final    BOTTLES DRAWN AEROBIC ONLY Blood Culture results may not be optimal due to an inadequate volume of blood received in culture bottles   Culture   Final    NO GROWTH 2 DAYS Performed at [facility], [address] [place], [place] [address]    Report Status PENDING  Incomplete  Culture, blood (x 2)     Status: None (Preliminary result)   Collection Time: 03/14/18  6:13 PM  Result Value Ref Range Status   Specimen Description BLOOD RIGHT FOREARM  Final   Special Requests   Final    BOTTLES DRAWN AEROBIC ONLY Blood Culture adequate volume   Culture   Final    NO GROWTH 2 DAYS Performed at [facility], [address] [place], [place] [address]  Report Status PENDING  Incomplete    Radiology Reports Ct Abdomen Pelvis W Contrast  Result Date: 03/15/2018 CLINICAL DATA:  Acute left-sided abdominal pain. EXAM: CT ABDOMEN AND PELVIS WITH CONTRAST TECHNIQUE: Multidetector CT imaging of the abdomen and pelvis was performed using the standard protocol following bolus administration of intravenous contrast. CONTRAST:  ISOVUE-300 IOPAMIDOL (ISOVUE-300) INJECTION 61% COMPARISON:  None. FINDINGS: Lower chest: Mild bibasilar subsegmental atelectasis is noted. Hepatobiliary: No focal liver abnormality is seen. Status post cholecystectomy. No biliary dilatation. Pancreas: Unremarkable. No pancreatic ductal dilatation or surrounding inflammatory changes. Spleen: Normal in size without focal abnormality. Adrenals/Urinary Tract: Adrenal glands  appear normal. Small nonobstructive left renal calculus is noted. No hydronephrosis or renal obstruction is noted. No ureteral calculi are noted. Urinary bladder is unremarkable. Stomach/Bowel: Stomach is within normal limits. Appendix appears normal. No evidence of bowel wall thickening, distention, or inflammatory changes. Vascular/Lymphatic: No significant vascular abnormality is noted. 2 cm left external iliac lymph node is noted. Reproductive: Uterus and bilateral adnexa are unremarkable. Other: No abnormal fluid collection or ascites is noted. Abdominal wall laxity is noted. Musculoskeletal: No acute or significant osseous findings. IMPRESSION: Small nonobstructive left renal calculus. No hydronephrosis or renal obstruction is noted. 2 cm left external iliac lymph node is noted which most likely is inflammatory or reactive in etiology. No other significant abnormality seen in the abdomen or pelvis. Electronically Signed   By: Lupita Raider, M.D.   On: 03/15/2018 11:50   Dg Chest Port 1 View  Result Date: 03/14/2018 CLINICAL DATA:  PICC line placement EXAM: PORTABLE CHEST 1 VIEW COMPARISON:  03/14/2018 FINDINGS: Cardiomegaly with vascular congestion. Bibasilar atelectasis. Mild interstitial prominence could reflect early interstitial edema. Right PICC line is in place with the tip in the SVC. IMPRESSION: Cardiomegaly with vascular congestion and probable early interstitial edema. Bibasilar atelectasis. Electronically Signed   By: Charlett Nose M.D.   On: 03/14/2018 22:16   Dg Chest Port 1 View  Result Date: 03/14/2018 CLINICAL DATA:  Shortness of breath. EXAM: PORTABLE CHEST 1 VIEW COMPARISON:  None. FINDINGS: The heart size and mediastinal contours are within normal limits. No pneumothorax or pleural effusion is noted. Minimal bibasilar subsegmental atelectasis or scarring is noted. The visualized skeletal structures are unremarkable. IMPRESSION: Minimal bibasilar subsegmental atelectasis or  scarring. Electronically Signed   By: Lupita Raider, M.D.   On: 03/14/2018 14:07   [place] Ekg Site Rite  Result Date: 03/14/2018 If Site Rite image not attached, placement could not be confirmed due to current cardiac rhythm.    Huey Bienenstock M.D on 03/16/2018 at 11:06 AM  Between 7am to 7pm - Pager - [phone]  After 7pm go to [email or web address] - password [facility]  [facility] -  Office  [phone]

## 2018-03-17 DIAGNOSIS — J441 Chronic obstructive pulmonary disease with (acute) exacerbation: Secondary | ICD-10-CM

## 2018-03-17 LAB — COOXEMETRY PANEL
Carboxyhemoglobin: 1.4 % (ref 0.5–1.5)
METHEMOGLOBIN: 0.9 % (ref 0.0–1.5)
O2 SAT: 72.6 %
TOTAL HEMOGLOBIN: 14.4 g/dL (ref 12.0–16.0)

## 2018-03-17 LAB — BASIC METABOLIC PANEL
ANION GAP: 11 (ref 5–15)
BUN: 19 mg/dL (ref 8–23)
CHLORIDE: 101 mmol/L (ref 98–111)
CO2: 28 mmol/L (ref 22–32)
Calcium: 9.6 mg/dL (ref 8.9–10.3)
Creatinine, Ser: 0.89 mg/dL (ref 0.44–1.00)
GFR calc Af Amer: >60 mL/min (ref >60)
GFR calc non Af Amer: >60 mL/min (ref >60)
Glucose, Bld: 132 mg/dL — ABNORMAL HIGH (ref 70–99)
POTASSIUM: 4 mmol/L (ref 3.5–5.1)
SODIUM: 140 mmol/L (ref 135–145)

## 2018-03-17 MED ORDER — LOSARTAN POTASSIUM 25 MG PO TABS
12.5000 mg | ORAL_TABLET | Freq: Every day | ORAL | Status: DC
  Administered 2018-03-17 – 2018-03-18 (×2): 12.5 mg via ORAL
  Filled 2018-03-17 (×3): qty 1

## 2018-03-17 NOTE — Progress Notes (Signed)
Patient ID: Carrie Walters, female   DOB: September 18, 1955, 62 y.o.   MRN: [id number]     Advanced Heart Failure Rounding Note  PCP-Cardiologist: Garwin Brothers, MD   Subjective:    Breathing overall better.  Remains in atrial fibrillation rate 70s-80s.    SBP 110s.  Co-ox 73%, CVP 6.   Objective:   Weight Range: 125.5 kg Body mass index is 44.66 kg/m.   Vital Signs:   Temp:  [97.6 F (36.4 C)-98.7 F (37.1 C)] 98.6 F (37 C) (10/27 1055) Pulse Rate:  [67-88] 70 (10/27 1055) Resp:  [21-28] 28 (10/27 1055) BP: (100-122)/(63-94) 102/63 (10/27 1055) SpO2:  [95 %-98 %] 97 % (10/27 1055) Weight:  [125.5 kg] 125.5 kg (10/27 0314) Last BM Date: 03/16/18  Weight change: Filed Weights   03/15/18 0300 03/16/18 0254 03/17/18 0314  Weight: 125.2 kg 125.3 kg 125.5 kg    Intake/Output:   Intake/Output Summary (Last 24 hours) at 03/17/2018 1204 Last data filed at 03/17/2018 1000 Gross per 24 hour  Intake 820 ml  Output 325 ml  Net 495 ml      Physical Exam    General: NAD Neck: Thick, JVP difficult, no thyromegaly or thyroid nodule.  Lungs: Occasional rhonchi.  CV: Nonpalpable PMI.  Heart irregular S1/S2, no S3/S4, no murmur.  Legs wrapped.    Abdomen: Soft, nontender, no hepatosplenomegaly, no distention.  Skin: Intact without lesions or rashes.  Neurologic: Alert and oriented x 3.  Psych: Normal affect. Extremities: No clubbing or cyanosis.  HEENT: Normal.    Telemetry   Atrial fibrillation 80s, personally reviewed.   EKG    No new tracings.    Labs    CBC Recent Labs    03/15/18 0815  WBC 8.3  HGB 14.4  HCT 46.6*  MCV 88.9  PLT 305   Basic Metabolic Panel Recent Labs    40/98/11 0459 03/17/18 0425  NA 140 140  K 4.3 4.0  CL 102 101  CO2 29 28  GLUCOSE 154* 132*  BUN 16 19  CREATININE 0.86 0.89  CALCIUM 9.5 9.6   Liver Function Tests No results for input(s): AST, ALT, ALKPHOS, BILITOT, PROT, ALBUMIN in the last 72 hours. No results for  input(s): LIPASE, AMYLASE in the last 72 hours. Cardiac Enzymes Recent Labs    03/14/18 1813 03/14/18 2255  TROPONINI <0.03 <0.03    BNP: BNP (last 3 results) Recent Labs    02/21/18 2315 03/14/18 1154  BNP 334.5* 99.7    ProBNP (last 3 results) No results for input(s): PROBNP in the last 8760 hours.   D-Dimer No results for input(s): DDIMER in the last 72 hours. Hemoglobin A1C No results for input(s): HGBA1C in the last 72 hours. Fasting Lipid Panel No results for input(s): CHOL, HDL, LDLCALC, TRIG, CHOLHDL, LDLDIRECT in the last 72 hours. Thyroid Function Tests No results for input(s): TSH, T4TOTAL, T3FREE, THYROIDAB in the last 72 hours.  Invalid input(s): FREET3  Other results:   Imaging    No results found.   Medications:     Scheduled Medications: . amiodarone  200 mg Oral BID  . apixaban  5 mg Oral BID  . bisoprolol  2.5 mg Oral Daily  . digoxin  0.125 mg Oral Daily  . furosemide  40 mg Oral Daily  . levothyroxine  25 mcg Oral QAC breakfast  . losartan  12.5 mg Oral Daily  . pantoprazole  40 mg Oral Daily  . predniSONE  20 mg Oral  Q breakfast  . sodium chloride flush  10-40 mL Intracatheter Q12H  . sodium chloride flush  3 mL Intravenous Q12H  . sodium chloride flush  3 mL Intravenous Q12H  . spironolactone  12.5 mg Oral Daily    Infusions: . sodium chloride    . sodium chloride      PRN Medications: sodium chloride, acetaminophen, albuterol, benzonatate, ondansetron (ZOFRAN) IV, sodium chloride flush, sodium chloride flush, sodium chloride flush    Patient Profile   Carrie Walters is a 62 y.o. female with a history of chronic atrial fibrillation, hypothyroidism, COPD, HTN, morbid obesity, VF arrest 02/21/18, and systolic heart failure due to NICM (diagnosed 02/2018)  Transfer[facility] from [facility] to [facility] with SOB and hypoxia.  Assessment/Plan   1. Dyspnea:  Improved.  Exam is difficult for volume but CVP is not elevated (6  today).  Suspect COPD exacerbation.   - Continue po Lasix.   - Steroids per primary service.  2. Hypotension: BP improved now off Entresto.  Doubt sepsis.  - Would continue to hold Entresto, plan to start low dose losartan today.   3. Acute on chronic systolic CHF: Nonischemic cardiomyopathy (cath in 10/19 without significant disease).  Echo in 10/19 with EF 25-30%.  As above, volume difficult on exam but CVP not elevated (6 today). BP low at admission, possibly due to [facility]. Co-ox 73% off PICC. - Continue bisoprolol given beta-1 selectivity. - She can continue spironolactone and digoxin.   - Restarted home Lasix 40 mg daily.  - Start losartan 12.5 mg daily.  - Will eventually need ICD (not CRT candidate) given recent VT arrest.  Needs healing of leg ulcers first.  4. Atrial fibrillation:  Persistent, not sure how long.  She has had atrial fibrillation for up to 5 years, but not sure if it comes and goes or is chronic.  She is on amiodarone and Eliquis.  I think it would be reasonable to make an attempt to get her back into NSR.  - Arrange for TEE-guided DCCV Monday.  Procedure risks/benefits discussed with patient and she agrees.   5. COPD: Suspect COPD exacerbation.  Per primary service.  She no longer smokes.   6. VF arrest: In 10/19.  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  Have been holding off on ICD until legs heal, which will likely take a while. Therefore, think we can go ahead and try to cardiovert her out of atrial fibrillation (will need uninterrupted anticoagulation for 4 wks afterwards).   7. Lower leg ulcerations: Suspect venous stasis changes.   - Wound consult.  - Peripheral arterial dopplers did not show significant disease.  8. LUQ pain: CT unremarkable.  9. Deconditioning: Continue PT.   Marca Ancona 03/17/2018 12:04 PM

## 2018-03-17 NOTE — Progress Notes (Signed)
PROGRESS NOTE                                                                                                                                                                                                             Patient Demographics:    Carrie Walters, is a 62 y.o. female, DOB - Jan 24, 1956, ZOX:[id number]  Admit date - 03/14/2018   Admitting Physician Lorretta Harp, MD  Outpatient Primary MD for the patient is [name], MD  LOS - 3   No chief complaint on file.      Brief Narrative    62 y.o. female with Past medical history of a flutter, hypothyroidism, COPD, HTN, morbid obesity, recent cardiac arrest. The patient comes to the hospital with multiple complaints.  She is a transfer from [facility] where she presented with complaints of shortness of breath and left upper quadrant pain, work-up significant for multiple exacerbation, acute on chronic systolic CHF.   Subjective:    [name] today has, No headache, No chest pain, No abdominal pain , no diarrhea, dyspnea has improved   a Principal Problem:   Acute on chronic combined systolic and diastolic CHF (congestive heart failure) (HCC) Active Problems:   Hypothyroidism   Atrial flutter (HCC)   Morbid obesity (HCC)   Pressure injury of skin   Diarrhea   Left upper quadrant pain  Acute on Chronic Systolic HF. NICM -Most recent echo 02/22/2018 with EF 20 to 25%, she does have a lot of rest at home. -Creases and volume management per CHF team, she is currently on oral Lasix, renal function remained stable , -Entresto and Aldactone currently on hold in the setting of low blood pressure, -She is on Coreg, digoxin,.  Chronic A. Fib -Not rate controlled, anticoagulation Eliquis 5 mg twice daily, on amiodarone 200 mg twice daily for heart rate control, per cardiology note plan for TEE/DCCV 03/18/2018  Left upper quadrant pain with diarrhea -There is no acute finding on CT  abdomen pelvis to explain her pain, she has nonobstructive renal stone in the left kidneys, she was encouraged to use incentive spirometry, as it was felt that this will be contributing to this. -Diarrhea has resolved  History of VF arrest 22,019 -She has LifeVest at home, along with EP as an outpatient - Has LifeVest at home - EP following outpatient  for possible ICD.May not be possible with chronic leg wounds (most recent wound has been present for 4 months)  COPD exacerbation -Wheezing resolved,cont  prednisone further to 20 mg oral daily .  Hypertension -Blood pressure has improved after holding Entresto, resumed on low-dose losartan by cardiology  Lower extremity wound -Wound care consulted  Hypothyroidism. - Continue Synthroid.  Code Status : Full code  Family Communication  : None at bedside  Disposition Plan  : Home after her TEE/DCCV on Monday  Consults  : Cardiology/CHF  Procedures  : None  DVT Prophylaxis  : Eliquis  Lab Results  Component Value Date   PLT 305 03/15/2018    Antibiotics  :    Anti-infectives (From admission, onward)   None        Objective:   Vitals:   03/16/18 2300 03/17/18 0314 03/17/18 0738 03/17/18 1055  BP: 116/79 122/87 (!) 111/94 102/63  Pulse: 88 74 68 70  Resp: (!) 23 (!) 23 (!) 21 (!) 28  Temp: 97.9 F (36.6 C) 97.7 F (36.5 C) 97.6 F (36.4 C) 98.6 F (37 C)  TempSrc: Oral Oral Oral Oral  SpO2:  98% 96% 97%  Weight:  125.5 kg    Height:        Wt Readings from Last 3 Encounters:  03/17/18 125.5 kg  03/01/18 123.9 kg  01/14/18 132.9 kg     Intake/Output Summary (Last 24 hours) at 03/17/2018 1217 Last data filed at 03/17/2018 1000 Gross per 24 hour  Intake 820 ml  Output 325 ml  Net 495 ml     Physical Exam  Awake Alert, Oriented X 3, No new F.N deficits, Normal affect Symmetrical Chest wall movement, Good air movement bilaterally, CTAB Regular,No Gallops,Rubs or new Murmurs, No Parasternal  Heave +ve B.Sounds, Abd Soft, No tenderness, No rebound - guarding or rigidity. No Cyanosis, Clubbing , +1 edema, right lower extremity skin changes, left lower extremity bandaged   Data Review:    CBC Recent Labs  Lab 03/14/18 1154 03/15/18 0815  WBC 12.4* 8.3  HGB 14.5 14.4  HCT 48.4* 46.6*  PLT 354 305  MCV 89.6 88.9  MCH 26.9 27.5  MCHC 30.0 30.9  RDW 16.5* 16.1*  LYMPHSABS 2.1  --   MONOABS 0.7  --   EOSABS 0.2  --   BASOSABS 0.1  --     Chemistries  Recent Labs  Lab 03/14/18 1154 03/15/18 0455 03/16/18 0459 03/17/18 0425  NA 137 136 140 140  K 3.5 4.4 4.3 4.0  CL 100 102 102 101  CO2 28 27 29 28   GLUCOSE 142* 230* 154* 132*  BUN 17 15 16 19   CREATININE 1.13* 0.95 0.86 0.89  CALCIUM 9.2 9.2 9.5 9.6  MG 2.0  --   --   --    ------------------------------------------------------------------------------------------------------------------ No results for input(s): CHOL, HDL, LDLCALC, TRIG, CHOLHDL, LDLDIRECT in the last 72 hours.  No results found for: HGBA1C ------------------------------------------------------------------------------------------------------------------ No results for input(s): TSH, T4TOTAL, T3FREE, THYROIDAB in the last 72 hours.  Invalid input(s): FREET3 ------------------------------------------------------------------------------------------------------------------ No results for input(s): VITAMINB12, FOLATE, FERRITIN, TIBC, IRON, RETICCTPCT in the last 72 hours.  Coagulation profile Recent Labs  Lab 03/14/18 1813  INR 1.13    No results for input(s): DDIMER in the last 72 hours.  Cardiac Enzymes Recent Labs  Lab 03/14/18 1154 03/14/18 1813 03/14/18 2255  TROPONINI <0.03 <0.03 <0.03   ------------------------------------------------------------------------------------------------------------------    Component Value Date/Time   BNP 99.7 03/14/2018 1154  Inpatient Medications  Scheduled Meds: . amiodarone  200 mg  Oral BID  . apixaban  5 mg Oral BID  . bisoprolol  2.5 mg Oral Daily  . digoxin  0.125 mg Oral Daily  . furosemide  40 mg Oral Daily  . levothyroxine  25 mcg Oral QAC breakfast  . losartan  12.5 mg Oral Daily  . pantoprazole  40 mg Oral Daily  . predniSONE  20 mg Oral Q breakfast  . sodium chloride flush  10-40 mL Intracatheter Q12H  . sodium chloride flush  3 mL Intravenous Q12H  . sodium chloride flush  3 mL Intravenous Q12H  . spironolactone  12.5 mg Oral Daily   Continuous Infusions: . sodium chloride    . sodium chloride     PRN Meds:.sodium chloride, acetaminophen, albuterol, benzonatate, ondansetron (ZOFRAN) IV, sodium chloride flush, sodium chloride flush, sodium chloride flush  Micro Results Recent Results (from the past 240 hour(s))  MRSA PCR Screening     Status: None   Collection Time: 03/14/18  3:12 PM  Result Value Ref Range Status   MRSA by PCR NEGATIVE NEGATIVE Final    Comment:        The GeneXpert MRSA Assay (FDA approved for NASAL specimens only), is one component of a comprehensive MRSA colonization surveillance program. It is not intended to diagnose MRSA infection nor to guide or monitor treatment for MRSA infections. Performed at [facility], [address] [place], [place] [address]   Respiratory Panel by PCR     Status: None   Collection Time: 03/14/18  3:12 PM  Result Value Ref Range Status   Adenovirus NOT DETECTED NOT DETECTED Final   Coronavirus 229E NOT DETECTED NOT DETECTED Final   Coronavirus HKU1 NOT DETECTED NOT DETECTED Final   Coronavirus NL63 NOT DETECTED NOT DETECTED Final   Coronavirus OC43 NOT DETECTED NOT DETECTED Final   Metapneumovirus NOT DETECTED NOT DETECTED Final   Rhinovirus / Enterovirus NOT DETECTED NOT DETECTED Final   Influenza A NOT DETECTED NOT DETECTED Final   Influenza B NOT DETECTED NOT DETECTED Final   Parainfluenza Virus 1 NOT DETECTED NOT DETECTED Final   Parainfluenza Virus 2 NOT DETECTED NOT  DETECTED Final   Parainfluenza Virus 3 NOT DETECTED NOT DETECTED Final   Parainfluenza Virus 4 NOT DETECTED NOT DETECTED Final   Respiratory Syncytial Virus NOT DETECTED NOT DETECTED Final   Bordetella pertussis NOT DETECTED NOT DETECTED Final   Chlamydophila pneumoniae NOT DETECTED NOT DETECTED Final   Mycoplasma pneumoniae NOT DETECTED NOT DETECTED Final    Comment: Performed at [facility], [address], [place], [place] [address]  Culture, blood (x 2)     Status: None (Preliminary result)   Collection Time: 03/14/18  6:09 PM  Result Value Ref Range Status   Specimen Description BLOOD RIGHT ARM  Final   Special Requests   Final    BOTTLES DRAWN AEROBIC ONLY Blood Culture results may not be optimal due to an inadequate volume of blood received in culture bottles   Culture   Final    NO GROWTH 2 DAYS Performed at [facility], [address], [place], [place] [address]    Report Status PENDING  Incomplete  Culture, blood (x 2)     Status: None (Preliminary result)   Collection Time: 03/14/18  6:13 PM  Result Value Ref Range Status   Specimen Description BLOOD RIGHT FOREARM  Final   Special Requests  Final    BOTTLES DRAWN AEROBIC ONLY Blood Culture adequate volume   Culture   Final    NO GROWTH 2 DAYS Performed at [facility], [address] [place], [place] [address]    Report Status PENDING  Incomplete    Radiology Reports Ct Abdomen Pelvis W Contrast  Result Date: 03/15/2018 CLINICAL DATA:  Acute left-sided abdominal pain. EXAM: CT ABDOMEN AND PELVIS WITH CONTRAST TECHNIQUE: Multidetector CT imaging of the abdomen and pelvis was performed using the standard protocol following bolus administration of intravenous contrast. CONTRAST:  ISOVUE-300 IOPAMIDOL (ISOVUE-300) INJECTION 61% COMPARISON:  None. FINDINGS: Lower chest: Mild bibasilar subsegmental atelectasis is noted. Hepatobiliary: No focal liver abnormality is seen. Status post cholecystectomy.  No biliary dilatation. Pancreas: Unremarkable. No pancreatic ductal dilatation or surrounding inflammatory changes. Spleen: Normal in size without focal abnormality. Adrenals/Urinary Tract: Adrenal glands appear normal. Small nonobstructive left renal calculus is noted. No hydronephrosis or renal obstruction is noted. No ureteral calculi are noted. Urinary bladder is unremarkable. Stomach/Bowel: Stomach is within normal limits. Appendix appears normal. No evidence of bowel wall thickening, distention, or inflammatory changes. Vascular/Lymphatic: No significant vascular abnormality is noted. 2 cm left external iliac lymph node is noted. Reproductive: Uterus and bilateral adnexa are unremarkable. Other: No abnormal fluid collection or ascites is noted. Abdominal wall laxity is noted. Musculoskeletal: No acute or significant osseous findings. IMPRESSION: Small nonobstructive left renal calculus. No hydronephrosis or renal obstruction is noted. 2 cm left external iliac lymph node is noted which most likely is inflammatory or reactive in etiology. No other significant abnormality seen in the abdomen or pelvis. Electronically Signed   By: Lupita Raider, M.D.   On: 03/15/2018 11:50   Dg Chest Port 1 View  Result Date: 03/14/2018 CLINICAL DATA:  PICC line placement EXAM: PORTABLE CHEST 1 VIEW COMPARISON:  03/14/2018 FINDINGS: Cardiomegaly with vascular congestion. Bibasilar atelectasis. Mild interstitial prominence could reflect early interstitial edema. Right PICC line is in place with the tip in the SVC. IMPRESSION: Cardiomegaly with vascular congestion and probable early interstitial edema. Bibasilar atelectasis. Electronically Signed   By: Charlett Nose M.D.   On: 03/14/2018 22:16   Dg Chest Port 1 View  Result Date: 03/14/2018 CLINICAL DATA:  Shortness of breath. EXAM: PORTABLE CHEST 1 VIEW COMPARISON:  None. FINDINGS: The heart size and mediastinal contours are within normal limits. No pneumothorax or  pleural effusion is noted. Minimal bibasilar subsegmental atelectasis or scarring is noted. The visualized skeletal structures are unremarkable. IMPRESSION: Minimal bibasilar subsegmental atelectasis or scarring. Electronically Signed   By: Lupita Raider, M.D.   On: 03/14/2018 14:07   [place] Ekg Site Rite  Result Date: 03/14/2018 If Site Rite image not attached, placement could not be confirmed due to current cardiac rhythm.    Huey Bienenstock M.D on 03/17/2018 at 12:17 PM  Between 7am to 7pm - Pager - [phone]  After 7pm go to [email or web address] - password [facility]  [facility] -  Office  [phone]

## 2018-03-17 NOTE — H&P (View-Only) (Signed)
Patient ID: Carrie Walters, female   DOB: 04/24/1956, 62 y.o.   MRN: [id number]     Advanced Heart Failure Rounding Note  PCP-Cardiologist: Rajan R Revankar, MD   Subjective:    Breathing overall better.  Remains in atrial fibrillation rate 70s-80s.    SBP 110s.  Co-ox 73%, CVP 6.   Objective:   Weight Range: 125.5 kg Body mass index is 44.66 kg/m.   Vital Signs:   Temp:  [97.6 F (36.4 C)-98.7 F (37.1 C)] 98.6 F (37 C) (10/27 1055) Pulse Rate:  [67-88] 70 (10/27 1055) Resp:  [21-28] 28 (10/27 1055) BP: (100-122)/(63-94) 102/63 (10/27 1055) SpO2:  [95 %-98 %] 97 % (10/27 1055) Weight:  [125.5 kg] 125.5 kg (10/27 0314) Last BM Date: 03/16/18  Weight change: Filed Weights   03/15/18 0300 03/16/18 0254 03/17/18 0314  Weight: 125.2 kg 125.3 kg 125.5 kg    Intake/Output:   Intake/Output Summary (Last 24 hours) at 03/17/2018 1204 Last data filed at 03/17/2018 1000 Gross per 24 hour  Intake 820 ml  Output 325 ml  Net 495 ml      Physical Exam    General: NAD Neck: Thick, JVP difficult, no thyromegaly or thyroid nodule.  Lungs: Occasional rhonchi.  CV: Nonpalpable PMI.  Heart irregular S1/S2, no S3/S4, no murmur.  Legs wrapped.    Abdomen: Soft, nontender, no hepatosplenomegaly, no distention.  Skin: Intact without lesions or rashes.  Neurologic: Alert and oriented x 3.  Psych: Normal affect. Extremities: No clubbing or cyanosis.  HEENT: Normal.    Telemetry   Atrial fibrillation 80s, personally reviewed.   EKG    No new tracings.    Labs    CBC Recent Labs    03/15/18 0815  WBC 8.3  HGB 14.4  HCT 46.6*  MCV 88.9  PLT 305   Basic Metabolic Panel Recent Labs    03/16/18 0459 03/17/18 0425  NA 140 140  K 4.3 4.0  CL 102 101  CO2 29 28  GLUCOSE 154* 132*  BUN 16 19  CREATININE 0.86 0.89  CALCIUM 9.5 9.6   Liver Function Tests No results for input(s): AST, ALT, ALKPHOS, BILITOT, PROT, ALBUMIN in the last 72 hours. No results for  input(s): LIPASE, AMYLASE in the last 72 hours. Cardiac Enzymes Recent Labs    03/14/18 1813 03/14/18 2255  TROPONINI <0.03 <0.03    BNP: BNP (last 3 results) Recent Labs    02/21/18 2315 03/14/18 1154  BNP 334.5* 99.7    ProBNP (last 3 results) No results for input(s): PROBNP in the last 8760 hours.   D-Dimer No results for input(s): DDIMER in the last 72 hours. Hemoglobin A1C No results for input(s): HGBA1C in the last 72 hours. Fasting Lipid Panel No results for input(s): CHOL, HDL, LDLCALC, TRIG, CHOLHDL, LDLDIRECT in the last 72 hours. Thyroid Function Tests No results for input(s): TSH, T4TOTAL, T3FREE, THYROIDAB in the last 72 hours.  Invalid input(s): FREET3  Other results:   Imaging    No results found.   Medications:     Scheduled Medications: . amiodarone  200 mg Oral BID  . apixaban  5 mg Oral BID  . bisoprolol  2.5 mg Oral Daily  . digoxin  0.125 mg Oral Daily  . furosemide  40 mg Oral Daily  . levothyroxine  25 mcg Oral QAC breakfast  . losartan  12.5 mg Oral Daily  . pantoprazole  40 mg Oral Daily  . predniSONE  20 mg Oral   Q breakfast  . sodium chloride flush  10-40 mL Intracatheter Q12H  . sodium chloride flush  3 mL Intravenous Q12H  . sodium chloride flush  3 mL Intravenous Q12H  . spironolactone  12.5 mg Oral Daily    Infusions: . sodium chloride    . sodium chloride      PRN Medications: sodium chloride, acetaminophen, albuterol, benzonatate, ondansetron (ZOFRAN) IV, sodium chloride flush, sodium chloride flush, sodium chloride flush    Patient Profile   Carrie Walters is a 62 y.o. female with a history of chronic atrial fibrillation, hypothyroidism, COPD, HTN, morbid obesity, VF arrest 02/21/18, and systolic heart failure due to NICM (diagnosed 02/2018)  Transferred from  Hospital to [place] with SOB and hypoxia.  Assessment/Plan   1. Dyspnea:  Improved.  Exam is difficult for volume but CVP is not elevated (6  today).  Suspect COPD exacerbation.   - Continue po Lasix.   - Steroids per primary service.  2. Hypotension: BP improved now off Entresto.  Doubt sepsis.  - Would continue to hold Entresto, plan to start low dose losartan today.   3. Acute on chronic systolic CHF: Nonischemic cardiomyopathy (cath in 10/19 without significant disease).  Echo in 10/19 with EF 25-30%.  As above, volume difficult on exam but CVP not elevated (6 today). BP low at admission, possibly due to Entresto. Co-ox 73% off PICC. - Continue bisoprolol given beta-1 selectivity. - She can continue spironolactone and digoxin.   - Restarted home Lasix 40 mg daily.  - Start losartan 12.5 mg daily.  - Will eventually need ICD (not CRT candidate) given recent VT arrest.  Needs healing of leg ulcers first.  4. Atrial fibrillation:  Persistent, not sure how long.  She has had atrial fibrillation for up to 5 years, but not sure if it comes and goes or is chronic.  She is on amiodarone and Eliquis.  I think it would be reasonable to make an attempt to get her back into NSR.  - Arrange for TEE-guided DCCV Monday.  Procedure risks/benefits discussed with patient and she agrees.   5. COPD: Suspect COPD exacerbation.  Per primary service.  She no longer smokes.   6. VF arrest: In 10/19.  She has a Lifevest for home.  Have been holding off on ICD until legs heal, which will likely take a while. Therefore, think we can go ahead and try to cardiovert her out of atrial fibrillation (will need uninterrupted anticoagulation for 4 wks afterwards).   7. Lower leg ulcerations: Suspect venous stasis changes.   - Wound consult.  - Peripheral arterial dopplers did not show significant disease.  8. LUQ pain: CT unremarkable.  9. Deconditioning: Continue PT.   Carrie Walters 03/17/2018 12:04 PM  

## 2018-03-18 ENCOUNTER — Inpatient Hospital Stay (HOSPITAL_COMMUNITY): Payer: Medicaid Other | Admitting: Anesthesiology

## 2018-03-18 ENCOUNTER — Encounter (HOSPITAL_COMMUNITY)
Admission: EM | Disposition: A | Discharge status: Home Health | Payer: Self-pay | Source: Other Acute Inpatient Hospital | Attending: Internal Medicine

## 2018-03-18 ENCOUNTER — Encounter (HOSPITAL_COMMUNITY): Payer: Self-pay | Admitting: Anesthesiology

## 2018-03-18 ENCOUNTER — Inpatient Hospital Stay (HOSPITAL_COMMUNITY): Payer: Medicaid Other

## 2018-03-18 DIAGNOSIS — I4891 Unspecified atrial fibrillation: Secondary | ICD-10-CM

## 2018-03-18 DIAGNOSIS — I340 Nonrheumatic mitral (valve) insufficiency: Secondary | ICD-10-CM

## 2018-03-18 HISTORY — PX: CARDIOVERSION: SHX1299

## 2018-03-18 HISTORY — PX: TEE WITHOUT CARDIOVERSION: SHX5443

## 2018-03-18 LAB — BASIC METABOLIC PANEL
Anion gap: 6 (ref 5–15)
BUN: 18 mg/dL (ref 8–23)
CO2: 30 mmol/L (ref 22–32)
Calcium: 9 mg/dL (ref 8.9–10.3)
Chloride: 104 mmol/L (ref 98–111)
Creatinine, Ser: 0.92 mg/dL (ref 0.44–1.00)
Glucose, Bld: 129 mg/dL — ABNORMAL HIGH (ref 70–99)
POTASSIUM: 3.8 mmol/L (ref 3.5–5.1)
SODIUM: 140 mmol/L (ref 135–145)

## 2018-03-18 LAB — CBC WITH DIFFERENTIAL/PLATELET
Abs Immature Granulocytes: 0.05 10*3/uL (ref 0.00–0.07)
BASOS PCT: 0 %
Basophils Absolute: 0 10*3/uL (ref 0.0–0.1)
Eosinophils Absolute: 0 10*3/uL (ref 0.0–0.5)
Eosinophils Relative: 0 %
HCT: 45.1 % (ref 36.0–46.0)
Hemoglobin: 13.5 g/dL (ref 12.0–15.0)
Immature Granulocytes: 1 %
Lymphocytes Relative: 30 %
Lymphs Abs: 2.7 10*3/uL (ref 0.7–4.0)
MCH: 27.1 pg (ref 26.0–34.0)
MCHC: 29.9 g/dL — AB (ref 30.0–36.0)
MCV: 90.4 fL (ref 80.0–100.0)
MONO ABS: 0.6 10*3/uL (ref 0.1–1.0)
MONOS PCT: 7 %
Neutro Abs: 5.5 10*3/uL (ref 1.7–7.7)
Neutrophils Relative %: 62 %
PLATELETS: 279 10*3/uL (ref 150–400)
RBC: 4.99 MIL/uL (ref 3.87–5.11)
RDW: 16.5 % — ABNORMAL HIGH (ref 11.5–15.5)
WBC: 8.8 10*3/uL (ref 4.0–10.5)
nRBC: 0 % (ref 0.0–0.2)

## 2018-03-18 LAB — MAGNESIUM: MAGNESIUM: 2.3 mg/dL (ref 1.7–2.4)

## 2018-03-18 SURGERY — ECHOCARDIOGRAM, TRANSESOPHAGEAL
Anesthesia: General

## 2018-03-18 MED ORDER — BUTAMBEN-TETRACAINE-BENZOCAINE 2-2-14 % EX AERO
INHALATION_SPRAY | CUTANEOUS | Status: DC | PRN
  Administered 2018-03-18: 2 via TOPICAL

## 2018-03-18 MED ORDER — PROPOFOL 10 MG/ML IV BOLUS
INTRAVENOUS | Status: DC | PRN
  Administered 2018-03-18: 20 mg via INTRAVENOUS

## 2018-03-18 MED ORDER — SODIUM CHLORIDE 0.9 % IV SOLN
INTRAVENOUS | Status: DC | PRN
  Administered 2018-03-18: 10 ug/min via INTRAVENOUS

## 2018-03-18 MED ORDER — LACTATED RINGERS IV SOLN
INTRAVENOUS | Status: DC
  Administered 2018-03-18: 13:00:00 via INTRAVENOUS

## 2018-03-18 MED ORDER — KETAMINE HCL 10 MG/ML IJ SOLN
INTRAMUSCULAR | Status: DC | PRN
  Administered 2018-03-18 (×2): 10 mg via INTRAVENOUS

## 2018-03-18 MED ORDER — SODIUM CHLORIDE 0.9 % IV SOLN: INTRAVENOUS | Status: DC | PRN

## 2018-03-18 MED ORDER — KETAMINE HCL 50 MG/5ML IJ SOSY
PREFILLED_SYRINGE | INTRAMUSCULAR | Status: AC
  Filled 2018-03-18: qty 5

## 2018-03-18 MED ORDER — FUROSEMIDE 10 MG/ML IJ SOLN
40.0000 mg | Freq: Once | INTRAMUSCULAR | Status: AC
  Administered 2018-03-18: 40 mg via INTRAVENOUS
  Filled 2018-03-18: qty 4

## 2018-03-18 MED ORDER — FUROSEMIDE 40 MG PO TABS: 40.0000 mg | ORAL_TABLET | Freq: Every day | ORAL | Status: DC

## 2018-03-18 MED ORDER — FUROSEMIDE 10 MG/ML IJ SOLN
40.0000 mg | Freq: Two times a day (BID) | INTRAMUSCULAR | Status: DC
  Administered 2018-03-18: 40 mg via INTRAVENOUS
  Filled 2018-03-18: qty 4

## 2018-03-18 MED ORDER — PROPOFOL 500 MG/50ML IV EMUL
INTRAVENOUS | Status: DC | PRN
  Administered 2018-03-18: 50 ug/kg/min via INTRAVENOUS

## 2018-03-18 NOTE — CV Procedure (Signed)
Procedure: TEE  Sedation: Per anesthesiology  Indication: Atrial fibrillation  Findings: Please see echo section for full report.  Mildly dilated LV with mild LV hypertrophy.  EF 30-35%, diffuse hypokinesis. Mild to moderately dilated RV with mild to moderately decreased systolic function.   Severe left atrial enlargement, no LA appendage thrombus.  Moderately dilated right atrium.  No ASD or PFO by color doppler.  Mild TR, peak RV-RA gradient 28 mmHg.  Mild-moderate MR.  Trileaflet aortic valve with trivial aortic insufficiency, no aortic stenosis.  The aorta was normal in caliber with minimal plaque.    May proceed to DCCV.   Carrie Walters 03/18/2018 2:12 PM

## 2018-03-18 NOTE — Progress Notes (Signed)
Physical Therapy Treatment Patient Details Name: Carrie Walters MRN: [id number] DOB: 21-Jun-1955 Today's Date: 03/18/2018    History of Present Illness Pt adm from [facility] with acute on chronic systolic heart failure. Pt with life vest at home since recent DC. PMH - chronic atrial fibrillation, hypothyroidism, COPD, HTN, morbid obesity, VF arrest 02/21/18, and systolic heart failure due to NICM (diagnosed 02/2018)    PT Comments    Pt received standing in room, agreeable to ambulation in hallway. She ambulated 250 feet with RW supervision. She demonstrated modified independence with bed mobility and transfers. Pt NPO at time of session due to scheduled for TEE/DCCV at 1300.    Follow Up Recommendations  Home health PT     Equipment Recommendations  Other (comment)(bariatric rollator)    Recommendations for Other Services       Precautions / Restrictions Precautions Precautions: None    Mobility  Bed Mobility Overal bed mobility: Modified Independent                Transfers Overall transfer level: Modified independent Equipment used: Ambulation equipment used Transfers: Sit to/from [employer] Transfers Sit to Stand: Modified independent (Device/Increase time) Stand pivot transfers: Modified independent (Device/Increase time)       General transfer comment: Pt independently transfering to/from [facility] in room.   Ambulation/Gait Ambulation/Gait assistance: Supervision Gait Distance (Feet): 250 Feet Assistive device: Rolling walker (2 wheeled) Gait Pattern/deviations: Step-through pattern;Decreased stride length Gait velocity: decreased   General Gait Details: Cues for posture. Pt reports mild fatigue following amb   Stairs             Wheelchair Mobility    Modified Rankin (Stroke Patients Only)       Balance   Sitting-balance support: No upper extremity supported;Feet supported Sitting balance-Leahy Scale: Good     Standing  balance support: No upper extremity supported;During functional activity Standing balance-Leahy Scale: Fair                              Cognition Arousal/Alertness: Awake/alert Behavior During Therapy: WFL for tasks assessed/performed Overall Cognitive Status: Within Functional Limits for tasks assessed                                        Exercises      General Comments        Pertinent Vitals/Pain Pain Assessment: No/denies pain    Home Living                      Prior Function            PT Goals (current goals can now be found in the care plan section) Acute Rehab PT Goals Patient Stated Goal: return home PT Goal Formulation: With patient Time For Goal Achievement: 03/28/18 Potential to Achieve Goals: Good Progress towards PT goals: Progressing toward goals    Frequency    Min 3X/week      PT Plan Current plan remains appropriate    Co-evaluation              AM-PAC PT "6 Clicks" Daily Activity  Outcome Measure  Difficulty turning over in bed (including adjusting bedclothes, sheets and blankets)?: None Difficulty moving from lying on back to sitting on the side of the bed? : None Difficulty sitting down on and standing  up from a chair with arms (e.g., wheelchair, bedside commode, etc,.)?: A Little Help needed moving to and from a bed to chair (including a wheelchair)?: None Help needed walking in hospital room?: A Little Help needed climbing 3-5 steps with a railing? : A Little 6 Click Score: 21    End of Session Equipment Utilized During Treatment: Gait belt Activity Tolerance: Patient tolerated treatment well Patient left: in chair;with call bell/phone within reach Nurse Communication: Mobility status PT Visit Diagnosis: Other abnormalities of gait and mobility (R26.89);Muscle weakness (generalized) (M62.81)     Time: 1610-9604 PT Time Calculation (min) (ACUTE ONLY): 15 min  Charges:  $Gait  Training: 8-22 mins                     Aida Raider, PT  Office # [phone] Pager [phone]    Ilda Foil 03/18/2018, 12:57 PM

## 2018-03-18 NOTE — Interval H&P Note (Signed)
History and Physical Interval Note:  03/18/2018 1:57 PM  Carrie Walters  has presented today for surgery, with the diagnosis of atrial flutter  The various methods of treatment have been discussed with the patient and family. After consideration of risks, benefits and other options for treatment, the patient has consented to  Procedure(s): TRANSESOPHAGEAL ECHOCARDIOGRAM (TEE) (N/A) CARDIOVERSION (N/A) as a surgical intervention .  The patient's history has been reviewed, patient examined, no change in status, stable for surgery.  I have reviewed the patient's chart and labs.  Questions were answered to the patient's satisfaction.     Meilah Delrosario [employer]

## 2018-03-18 NOTE — Care Management Note (Addendum)
Case Management Note  Patient Details  Name: Carrie Walters MRN: [id number] Date of Birth: 14-Jul-1955   Subjective/Objective:  Pt is a readmit - this admit pt had chest pressure                   Action/Plan:  PTA from home active with [facility] for [facility] only (PT performed eval post last discharge and did not continue PT per [facility]).  Pt also has life vest - liaison made aware of pts hospitalization.  CM will continue to follow for discharge needs   Expected Discharge Date:                  Expected Discharge Plan:  Home w Home Health Services  In-House Referral:     Discharge planning Services  CM Consult  Post Acute Care Choice:  Resumption of Svcs/PTA Provider Choice offered to:     DME Arranged:  Walker rolling with seat(bariatric) DME Agency:  [facility]  HH Arranged:  RN, PT [facility] Agency:  [facility]  Status of Service:  In process, will continue to follow  If discussed at Long Length of Stay Meetings, dates discussed:    Additional Comments: 03/18/2018  DME agency choice given - [facility] chosen - referral accepted and agency informed pt my discharge home today.  Life Vest liaison contacted and informed pt may discharge home today - no interventions needed  03/15/18 CM informed [facility] that HHPT is now also recommended - [facility] will follow pt for both RN and PT Cherylann Parr, RN 03/18/2018, 4:24 PM

## 2018-03-18 NOTE — Progress Notes (Addendum)
Patient ID: Carrie Walters, female   DOB: 08-Feb-1956, 62 y.o.   MRN: [id number]     Advanced Heart Failure Rounding Note  PCP-Cardiologist: Garwin Brothers, MD   Subjective:    Remains in atrial fib/flutter 70-80s. Going for TEE/DCCV today.   Restarted losartan 12.5 mg yesterday. SBP 110-120s.  Co-ox 73%, CVP ~12. Weight down 2 lbs.   Creatinine stable.  Denies SOB. Had some palpitations overnight.   Objective:   Weight Range: 124.4 kg Body mass index is 44.27 kg/m.   Vital Signs:   Temp:  [97.6 F (36.4 C)-98.6 F (37 C)] 98.5 F (36.9 C) (10/28 0739) Pulse Rate:  [70-85] 70 (10/28 0739) Resp:  [18-28] 20 (10/28 0739) BP: (102-128)/(63-87) 120/87 (10/28 0739) SpO2:  [94 %-98 %] 95 % (10/28 0739) Weight:  [124.4 kg] 124.4 kg (10/28 0319) Last BM Date: 03/17/18  Weight change: Filed Weights   03/16/18 0254 03/17/18 0314 03/18/18 0319  Weight: 125.3 kg 125.5 kg 124.4 kg    Intake/Output:   Intake/Output Summary (Last 24 hours) at 03/18/2018 0811 Last data filed at 03/18/2018 0319 Gross per 24 hour  Intake 1620 ml  Output 1350 ml  Net 270 ml      Physical Exam    General: No resp difficulty. Obese. HEENT: Normal Neck: Supple. JVP difficult. Carotids 2+ bilat; no bruits. No thyromegaly or nodule noted. Cor: PMI nonpalpable. IRR, No M/G/R noted Lungs: CTAB, normal effort. Abdomen: Soft, non-tender, non-distended, no HSM. No bruits or masses. +BS  Extremities: No cyanosis, clubbing, or rash. LLE wrapped. RLE 1+ edema, erythematous Neuro: Alert & orientedx3, cranial nerves grossly intact. moves all 4 extremities w/o difficulty. Affect pleasant   Telemetry   Aflutter 70-80s, personally reviewed.   EKG    No new tracings.    Labs    CBC Recent Labs    03/15/18 0815 03/18/18 0450  WBC 8.3 8.8  NEUTROABS  --  5.5  HGB 14.4 13.5  HCT 46.6* 45.1  MCV 88.9 90.4  PLT 305 279   Basic Metabolic Panel Recent Labs    40/98/11 0425 03/18/18 0450    NA 140 140  K 4.0 3.8  CL 101 104  CO2 28 30  GLUCOSE 132* 129*  BUN 19 18  CREATININE 0.89 0.92  CALCIUM 9.6 9.0  MG  --  2.3   Liver Function Tests No results for input(s): AST, ALT, ALKPHOS, BILITOT, PROT, ALBUMIN in the last 72 hours. No results for input(s): LIPASE, AMYLASE in the last 72 hours. Cardiac Enzymes No results for input(s): CKTOTAL, CKMB, CKMBINDEX, TROPONINI in the last 72 hours.  BNP: BNP (last 3 results) Recent Labs    02/21/18 2315 03/14/18 1154  BNP 334.5* 99.7    ProBNP (last 3 results) No results for input(s): PROBNP in the last 8760 hours.   D-Dimer No results for input(s): DDIMER in the last 72 hours. Hemoglobin A1C No results for input(s): HGBA1C in the last 72 hours. Fasting Lipid Panel No results for input(s): CHOL, HDL, LDLCALC, TRIG, CHOLHDL, LDLDIRECT in the last 72 hours. Thyroid Function Tests No results for input(s): TSH, T4TOTAL, T3FREE, THYROIDAB in the last 72 hours.  Invalid input(s): FREET3  Other results:   Imaging    No results found.   Medications:     Scheduled Medications: . amiodarone  200 mg Oral BID  . apixaban  5 mg Oral BID  . bisoprolol  2.5 mg Oral Daily  . digoxin  0.125 mg Oral Daily  .  furosemide  40 mg Oral Daily  . levothyroxine  25 mcg Oral QAC breakfast  . losartan  12.5 mg Oral Daily  . pantoprazole  40 mg Oral Daily  . predniSONE  20 mg Oral Q breakfast  . sodium chloride flush  10-40 mL Intracatheter Q12H  . sodium chloride flush  3 mL Intravenous Q12H  . sodium chloride flush  3 mL Intravenous Q12H  . spironolactone  12.5 mg Oral Daily    Infusions: . sodium chloride    . sodium chloride      PRN Medications: sodium chloride, acetaminophen, albuterol, benzonatate, ondansetron (ZOFRAN) IV, sodium chloride flush, sodium chloride flush, sodium chloride flush    Patient Profile   Carrie Walters is a 62 y.o. female with a history of chronic atrial fibrillation, hypothyroidism,  COPD, HTN, morbid obesity, VF arrest 02/21/18, and systolic heart failure due to NICM (diagnosed 02/2018)  Transferred from [facility] to [facility] with SOB and hypoxia.  Assessment/Plan   1. Dyspnea:  Improved.  Suspect combination of CHF and COPD exacerbation.   - Lasix, will give IV today.  - Steroids per primary service.  2. Hypotension: BP improved now off Entresto.  Doubt sepsis.  - Would continue to hold Entresto, plan to start low dose losartan today.   3. Acute on chronic systolic CHF: Nonischemic cardiomyopathy (cath in 10/19 without significant disease).  Echo in 10/19 with EF 25-30%.  As above, volume difficult on exam but CVP not elevated (~12 today). BP low at admission, possibly due to [facility] Co-ox 73% off PICC. - Continue bisoprolol given beta-1 selectivity. - She can continue spironolactone and digoxin.   - CVP up to 12 today. Give 40 mg IV lasix bid today. Plan to resume PO tomorrow.  - Continue losartan 12.5 mg daily. SBP 110-120s. Hold off on increase until after DCCV today.  - Will eventually need ICD (not CRT candidate) given recent VT arrest.  Needs healing of leg ulcers first.  4. Atrial fibrillation:  Persistent, not sure how long.  She has had atrial fibrillation for up to 5 years, but not sure if it comes and goes or is chronic.  She is on amiodarone and Eliquis.  I think it would be reasonable to make an attempt to get her back into NSR.  - Remains in fib/flutter. - TEE-guided DCCV today at 2 pm.  Procedure risks/benefits discussed with patient and she agrees.   5. COPD: Suspect COPD exacerbation.  Per primary service.  She no longer smokes.  No change.  6. VF arrest: In 10/19.  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  Have been holding off on ICD until legs heal, which will likely take a while. Therefore, think we can go ahead and try to cardiovert her out of atrial fibrillation (will need uninterrupted anticoagulation for 4 wks afterwards).   7. Lower leg  ulcerations: Suspect venous stasis changes.   - Wound following  - Peripheral arterial dopplers did not show significant disease.  8. LUQ pain: CT unremarkable. No change.  9. Deconditioning: Continue PT.   Going for TEE/DCCV at 2 pm today.   Alford Highland 03/18/2018 8:10 AM  Advanced Heart Failure Team Pager [phone] (M-F; 7a - 4p)  Please contact [facility] for night-coverage after hours (4p -7a ) and weekends on [email or web address]  Patient seen with NP, agree with the above note.   TEE done, showing EF 30-35% with diffuse hypokinesis, mild to moderately dilated RV with mild to moderate RV  systolic function.  She was successfully cardioverted back to NSR.  - Continue amiodarone 200 mg bid for another week, then back to daily.  - Severely dilated left atrium, probably would not be a good candidate for afib ablation.   Mild volume overload, will give Lasix IV today (40 mg IV bid).  Reassess tomorrow, possibly resume po.   Marca Ancona 03/18/2018 2:16 PM

## 2018-03-18 NOTE — Progress Notes (Signed)
  Echocardiogram Echocardiogram Transesophageal has been performed.  Celene Skeen 03/18/2018, 2:21 PM

## 2018-03-18 NOTE — Transfer of Care (Signed)
Immediate Anesthesia Transfer of Care Note  Patient: Carrie Walters  Procedure(s) Performed: TRANSESOPHAGEAL ECHOCARDIOGRAM (TEE) (N/A ) CARDIOVERSION (N/A )  Patient Location: Endoscopy Unit  Anesthesia Type:MAC  Level of Consciousness: awake, alert  and oriented  Airway & Oxygen Therapy: Patient Spontanous Breathing and Patient connected to nasal cannula oxygen  Post-op Assessment: Report given to RN and Post -op Vital signs reviewed and stable  Post vital signs: Reviewed and stable  Last Vitals:  Vitals Value Taken Time  BP    Temp    Pulse    Resp    SpO2      Last Pain:  Vitals:   03/18/18 1300  TempSrc: Oral  PainSc: 0-No pain      Patients Stated Pain Goal: 2 (82/50/53 9767)  Complications: No apparent anesthesia complications

## 2018-03-18 NOTE — Progress Notes (Signed)
PROGRESS NOTE                                                                                                                                                                                                             Patient Demographics:    Carrie Walters, is a 62 y.o. female, DOB - [birth year], ZOX:[id number]  Admit date - 03/14/2018   Admitting Physician Lorretta Harp, MD  Outpatient Primary MD for the patient is [name], MD  LOS - 4   No chief complaint on file.      Brief Narrative    62 y.o. female with Past medical history of a flutter, hypothyroidism, COPD, HTN, morbid obesity, recent cardiac arrest. The patient comes to the hospital with multiple complaints.  She is a transfer from [facility] where she presented with complaints of shortness of breath and left upper quadrant pain, work-up significant for multiple exacerbation, acute on chronic systolic CHF.   Subjective:    Carrie Walters today has, No headache, No chest pain, No abdominal pain , no diarrhea, dyspnea has improved   a Principal Problem:   Acute on chronic combined systolic and diastolic CHF (congestive heart failure) (HCC) Active Problems:   Hypothyroidism   Atrial flutter (HCC)   Morbid obesity (HCC)   Pressure injury of skin   Diarrhea   Left upper quadrant pain  Acute on Chronic Systolic HF. NICM -Most recent echo 02/22/2018 with EF 20 to 25%, she does have a lot of rest at home. -Creases and volume management per CHF team, she is currently on oral Lasix, she did receive 1 dose of Lasix today per cardiology renal function remained stable , -Entresto and Aldactone currently on hold in the setting of low blood pressure, -She is on Coreg, digoxin,.  Chronic A. Fib -Not rate controlled, anticoagulation Eliquis 5 mg twice daily, on amiodarone 200 mg twice daily for heart rate control, she went today for TEE/DCCV, currently she is in normal sinus rhythm  Left  upper quadrant pain with diarrhea -There is no acute finding on CT abdomen pelvis to explain her pain, she has nonobstructive renal stone in the left kidneys, she was encouraged to use incentive spirometry, as it was felt that this will be contributing to this. -Diarrhea has resolved  History of VF arrest 22,019 -She has LifeVest at home,  along with EP as an outpatient - Has LifeVest at home - EP following outpatient for possible ICD.May not be possible with chronic leg wounds (most recent wound has been present for 4 months)  COPD exacerbation -Wheezing resolved,cont  prednisone further to 20 mg oral daily .  Hypertension -Blood pressure has improved after holding Entresto, resumed on low-dose losartan by cardiology  Lower extremity wound -Wound care consulted  Hypothyroidism. - Continue Synthroid.  Code Status : Full code  Family Communication  : None at bedside  Disposition Plan  : Home after her TEE/DCCV on Monday  Consults  : Cardiology/CHF  Procedures  : None  DVT Prophylaxis  : Eliquis  Lab Results  Component Value Date   PLT 279 03/18/2018    Antibiotics  :    Anti-infectives (From admission, onward)   None        Objective:   Vitals:   03/18/18 1425 03/18/18 1446 03/18/18 1500 03/18/18 1600  BP: 134/89 129/87    Pulse:  70    Resp:  (!) 21 18 (!) 23  Temp:  98.5 F (36.9 C)    TempSrc:  Oral    SpO2:  96%    Weight:      Height:        Wt Readings from Last 3 Encounters:  03/18/18 124.4 kg  03/01/18 123.9 kg  01/14/18 132.9 kg     Intake/Output Summary (Last 24 hours) at 03/18/2018 1724 Last data filed at 03/18/2018 1447 Gross per 24 hour  Intake 1100 ml  Output 3000 ml  Net -1900 ml     Physical Exam  Awake Alert, Oriented X 3, No new F.N deficits, Normal affect Symmetrical Chest wall movement, Good air movement bilaterally, CTAB RRR,No Gallops,Rubs or new Murmurs, No Parasternal Heave +ve B.Sounds, Abd Soft, No tenderness,  No rebound - guarding or rigidity. No Cyanosis, Clubbing , +1 edema, right lower extremity skin changes, left lower extremity bandaged   Data Review:    CBC Recent Labs  Lab 03/14/18 1154 03/15/18 0815 03/18/18 0450  WBC 12.4* 8.3 8.8  HGB 14.5 14.4 13.5  HCT 48.4* 46.6* 45.1  PLT 354 305 279  MCV 89.6 88.9 90.4  MCH 26.9 27.5 27.1  MCHC 30.0 30.9 29.9*  RDW 16.5* 16.1* 16.5*  LYMPHSABS 2.1  --  2.7  MONOABS 0.7  --  0.6  EOSABS 0.2  --  0.0  BASOSABS 0.1  --  0.0    Chemistries  Recent Labs  Lab 03/14/18 1154 03/15/18 0455 03/16/18 0459 03/17/18 0425 03/18/18 0450  NA 137 136 140 140 140  K 3.5 4.4 4.3 4.0 3.8  CL 100 102 102 101 104  CO2 28 27 29 28 30   GLUCOSE 142* 230* 154* 132* 129*  BUN 17 15 16 19 18   CREATININE 1.13* 0.95 0.86 0.89 0.92  CALCIUM 9.2 9.2 9.5 9.6 9.0  MG 2.0  --   --   --  2.3   ------------------------------------------------------------------------------------------------------------------ No results for input(s): CHOL, HDL, LDLCALC, TRIG, CHOLHDL, LDLDIRECT in the last 72 hours.  No results found for: HGBA1C ------------------------------------------------------------------------------------------------------------------ No results for input(s): TSH, T4TOTAL, T3FREE, THYROIDAB in the last 72 hours.  Invalid input(s): FREET3 ------------------------------------------------------------------------------------------------------------------ No results for input(s): VITAMINB12, FOLATE, FERRITIN, TIBC, IRON, RETICCTPCT in the last 72 hours.  Coagulation profile Recent Labs  Lab 03/14/18 1813  INR 1.13    No results for input(s): DDIMER in the last 72 hours.  Cardiac Enzymes Recent Labs  Lab  03/14/18 1154 03/14/18 1813 03/14/18 2255  TROPONINI <0.03 <0.03 <0.03   ------------------------------------------------------------------------------------------------------------------    Component Value Date/Time   BNP 99.7  03/14/2018 1154    Inpatient Medications  Scheduled Meds: . amiodarone  200 mg Oral BID  . apixaban  5 mg Oral BID  . bisoprolol  2.5 mg Oral Daily  . digoxin  0.125 mg Oral Daily  . furosemide  40 mg Intravenous BID  . levothyroxine  25 mcg Oral QAC breakfast  . losartan  12.5 mg Oral Daily  . pantoprazole  40 mg Oral Daily  . predniSONE  20 mg Oral Q breakfast  . sodium chloride flush  10-40 mL Intracatheter Q12H  . sodium chloride flush  3 mL Intravenous Q12H  . sodium chloride flush  3 mL Intravenous Q12H  . spironolactone  12.5 mg Oral Daily   Continuous Infusions: . sodium chloride    . sodium chloride     PRN Meds:.sodium chloride, acetaminophen, albuterol, benzonatate, ondansetron (ZOFRAN) IV, sodium chloride flush, sodium chloride flush, sodium chloride flush  Micro Results Recent Results (from the past 240 hour(s))  MRSA PCR Screening     Status: None   Collection Time: 03/14/18  3:12 PM  Result Value Ref Range Status   MRSA by PCR NEGATIVE NEGATIVE Final    Comment:        The GeneXpert MRSA Assay (FDA approved for NASAL specimens only), is one component of a comprehensive MRSA colonization surveillance program. It is not intended to diagnose MRSA infection nor to guide or monitor treatment for MRSA infections. Performed at [facility], [address], [place], [place] [address]   Respiratory Panel by PCR     Status: None   Collection Time: 03/14/18  3:12 PM  Result Value Ref Range Status   Adenovirus NOT DETECTED NOT DETECTED Final   Coronavirus 229E NOT DETECTED NOT DETECTED Final   Coronavirus HKU1 NOT DETECTED NOT DETECTED Final   Coronavirus NL63 NOT DETECTED NOT DETECTED Final   Coronavirus OC43 NOT DETECTED NOT DETECTED Final   Metapneumovirus NOT DETECTED NOT DETECTED Final   Rhinovirus / Enterovirus NOT DETECTED NOT DETECTED Final   Influenza A NOT DETECTED NOT DETECTED Final   Influenza B NOT DETECTED NOT DETECTED Final    Parainfluenza Virus 1 NOT DETECTED NOT DETECTED Final   Parainfluenza Virus 2 NOT DETECTED NOT DETECTED Final   Parainfluenza Virus 3 NOT DETECTED NOT DETECTED Final   Parainfluenza Virus 4 NOT DETECTED NOT DETECTED Final   Respiratory Syncytial Virus NOT DETECTED NOT DETECTED Final   Bordetella pertussis NOT DETECTED NOT DETECTED Final   Chlamydophila pneumoniae NOT DETECTED NOT DETECTED Final   Mycoplasma pneumoniae NOT DETECTED NOT DETECTED Final    Comment: Performed at [facility], [address], [place], [place] [address]  Culture, blood (x 2)     Status: None (Preliminary result)   Collection Time: 03/14/18  6:09 PM  Result Value Ref Range Status   Specimen Description BLOOD RIGHT ARM  Final   Special Requests   Final    BOTTLES DRAWN AEROBIC ONLY Blood Culture results may not be optimal due to an inadequate volume of blood received in culture bottles   Culture   Final    NO GROWTH 4 DAYS Performed at [facility], [address], [place], [place] [address]    Report Status PENDING  Incomplete  Culture, blood (x 2)     Status: None (Preliminary result)  Collection Time: 03/14/18  6:13 PM  Result Value Ref Range Status   Specimen Description BLOOD RIGHT FOREARM  Final   Special Requests   Final    BOTTLES DRAWN AEROBIC ONLY Blood Culture adequate volume   Culture   Final    NO GROWTH 4 DAYS Performed at [facility], [address], [place], [place] [address]    Report Status PENDING  Incomplete    Radiology Reports Ct Abdomen Pelvis W Contrast  Result Date: 03/15/2018 CLINICAL DATA:  Acute left-sided abdominal pain. EXAM: CT ABDOMEN AND PELVIS WITH CONTRAST TECHNIQUE: Multidetector CT imaging of the abdomen and pelvis was performed using the standard protocol following bolus administration of intravenous contrast. CONTRAST:  ISOVUE-300 IOPAMIDOL (ISOVUE-300) INJECTION 61% COMPARISON:  None. FINDINGS: Lower chest: Mild bibasilar subsegmental  atelectasis is noted. Hepatobiliary: No focal liver abnormality is seen. Status post cholecystectomy. No biliary dilatation. Pancreas: Unremarkable. No pancreatic ductal dilatation or surrounding inflammatory changes. Spleen: Normal in size without focal abnormality. Adrenals/Urinary Tract: Adrenal glands appear normal. Small nonobstructive left renal calculus is noted. No hydronephrosis or renal obstruction is noted. No ureteral calculi are noted. Urinary bladder is unremarkable. Stomach/Bowel: Stomach is within normal limits. Appendix appears normal. No evidence of bowel wall thickening, distention, or inflammatory changes. Vascular/Lymphatic: No significant vascular abnormality is noted. 2 cm left external iliac lymph node is noted. Reproductive: Uterus and bilateral adnexa are unremarkable. Other: No abnormal fluid collection or ascites is noted. Abdominal wall laxity is noted. Musculoskeletal: No acute or significant osseous findings. IMPRESSION: Small nonobstructive left renal calculus. No hydronephrosis or renal obstruction is noted. 2 cm left external iliac lymph node is noted which most likely is inflammatory or reactive in etiology. No other significant abnormality seen in the abdomen or pelvis. Electronically Signed   By: Lupita Raider, M.D.   On: 03/15/2018 11:50   Dg Chest Port 1 View  Result Date: 03/14/2018 CLINICAL DATA:  PICC line placement EXAM: PORTABLE CHEST 1 VIEW COMPARISON:  03/14/2018 FINDINGS: Cardiomegaly with vascular congestion. Bibasilar atelectasis. Mild interstitial prominence could reflect early interstitial edema. Right PICC line is in place with the tip in the SVC. IMPRESSION: Cardiomegaly with vascular congestion and probable early interstitial edema. Bibasilar atelectasis. Electronically Signed   By: Charlett Nose M.D.   On: 03/14/2018 22:16   Dg Chest Port 1 View  Result Date: 03/14/2018 CLINICAL DATA:  Shortness of breath. EXAM: PORTABLE CHEST 1 VIEW COMPARISON:   None. FINDINGS: The heart size and mediastinal contours are within normal limits. No pneumothorax or pleural effusion is noted. Minimal bibasilar subsegmental atelectasis or scarring is noted. The visualized skeletal structures are unremarkable. IMPRESSION: Minimal bibasilar subsegmental atelectasis or scarring. Electronically Signed   By: Lupita Raider, M.D.   On: 03/14/2018 14:07   [place] Ekg Site Rite  Result Date: 03/14/2018 If Site Rite image not attached, placement could not be confirmed due to current cardiac rhythm.    Huey Bienenstock M.D on 03/18/2018 at 5:24 PM  Between 7am to 7pm - Pager - [phone]  After 7pm go to [email or web address] - password [facility]  [facility] -  Office  [phone]

## 2018-03-18 NOTE — Clinical Social Work Note (Signed)
Patient's daughter unable to pick her up until Wednesday and patient cannot afford to pay for a cab. Cab voucher approved by [employer] of social work. Confirmed address with patient and gave cab voucher to RN.  CSW signing off. Consult again if any other social work needs arise.  Charlynn Court, CSW [phone]

## 2018-03-18 NOTE — Plan of Care (Signed)

## 2018-03-18 NOTE — Consult Note (Addendum)
WOC follow-up: Refer to previous WOC consult note performed on 10/24.  ABI was within normal limits to right leg. Left: Resting left ankle-brachial index indicates noncompressible left lower extremity arteries.  Will not apply Profore or Una boots.  Continue present plan of care and topical treatment orders have been provided for bedside nurses to perform. Please re-consult if further assistance is needed.  Thank-you,  Cammie Mcgee MSN, RN, CWOCN, [place], CNS [phone]

## 2018-03-18 NOTE — Anesthesia Preprocedure Evaluation (Addendum)
Anesthesia Evaluation  Patient identified by MRN, date of birth, ID band Patient awake    Reviewed: Allergy & Precautions, NPO status , Patient's Chart, lab work & pertinent test results, reviewed documented beta blocker date and time   Airway Mallampati: III       Dental  (+) Poor Dentition, Missing, Dental Advisory Given   Pulmonary former smoker,    Pulmonary exam normal breath sounds clear to auscultation       Cardiovascular hypertension, Pt. on home beta blockers  Rhythm:Irregular Rate:Normal     Neuro/Psych Anxiety    GI/Hepatic negative GI ROS, Neg liver ROS,   Endo/Other  Morbid obesity  Renal/GU negative Renal ROS     Musculoskeletal   Abdominal (+) + obese,   Peds  Hematology negative hematology ROS (+)   Anesthesia Other Findings   Reproductive/Obstetrics                            Anesthesia Physical Anesthesia Plan  ASA: III  Anesthesia Plan: General   Post-op Pain Management:    Induction: Intravenous  PONV Risk Score and Plan:   Airway Management Planned: Natural Airway and Simple Face Mask  Additional Equipment:   Intra-op Plan:   Post-operative Plan:   Informed Consent: I have reviewed the patients History and Physical, chart, labs and discussed the procedure including the risks, benefits and alternatives for the proposed anesthesia with the patient or authorized representative who has indicated his/her understanding and acceptance.     Plan Discussed with: CRNA  Anesthesia Plan Comments:         Anesthesia Quick Evaluation

## 2018-03-18 NOTE — Anesthesia Postprocedure Evaluation (Signed)
Anesthesia Post Note  Patient: Carrie Walters  Procedure(s) Performed: TRANSESOPHAGEAL ECHOCARDIOGRAM (TEE) (N/A ) CARDIOVERSION (N/A )     Patient location during evaluation: Endoscopy Anesthesia Type: General Level of consciousness: awake Pain management: pain level controlled Vital Signs Assessment: post-procedure vital signs reviewed and stable Respiratory status: spontaneous breathing Cardiovascular status: stable Postop Assessment: no apparent nausea or vomiting Anesthetic complications: no    Last Vitals:  Vitals:   03/18/18 1418 03/18/18 1425  BP: 128/77 134/89  Pulse: 68   Resp: (!) 23   Temp: 36.5 C   SpO2: 94%     Last Pain:  Vitals:   03/18/18 1425  TempSrc:   PainSc: 0-No pain   Pain Goal: Patients Stated Pain Goal: 2 (03/16/18 0856)               Caren Macadam

## 2018-03-18 NOTE — Procedures (Signed)
Electrical Cardioversion Procedure Note [facility] [id number] September 04, 1955  Procedure: Electrical Cardioversion Indications:  Atrial Fibrillation  Procedure Details Consent: Risks of procedure as well as the alternatives and risks of each were explained to the (patient/caregiver).  Consent for procedure obtained. Time Out: Verified patient identification, verified procedure, site/side was marked, verified correct patient position, special equipment/implants available, medications/allergies/relevent history reviewed, required imaging and test results available.  Performed  Patient placed on cardiac monitor, pulse oximetry, supplemental oxygen as necessary.  Sedation given: Propofol per anesthesiology Pacer pads placed anterior and posterior chest.  Cardioverted 1 time(s).  Cardioverted at 200J.  Evaluation Findings: Post procedure EKG shows: NSR Complications: None Patient did tolerate procedure well.   Marca Ancona 03/18/2018, 2:12 PM

## 2018-03-18 NOTE — Anesthesia Procedure Notes (Signed)
Procedure Name: MAC Date/Time: 03/18/2018 1:51 PM Performed by: Marsa Aris, CRNA Pre-anesthesia Checklist: Patient identified, Emergency Drugs available, Suction available, Patient being monitored and Timeout performed Patient Re-evaluated:Patient Re-evaluated prior to induction Oxygen Delivery Method: Nasal cannula Preoxygenation: Pre-oxygenation with 100% oxygen Induction Type: IV induction

## 2018-03-19 ENCOUNTER — Ambulatory Visit: Payer: Medicaid Other | Admitting: Cardiology

## 2018-03-19 ENCOUNTER — Encounter (HOSPITAL_COMMUNITY): Payer: Self-pay | Admitting: Cardiology

## 2018-03-19 ENCOUNTER — Telehealth (HOSPITAL_COMMUNITY): Payer: Self-pay | Admitting: Pharmacist

## 2018-03-19 DIAGNOSIS — I480 Paroxysmal atrial fibrillation: Secondary | ICD-10-CM

## 2018-03-19 LAB — BASIC METABOLIC PANEL
ANION GAP: 9 (ref 5–15)
BUN: 17 mg/dL (ref 8–23)
CHLORIDE: 99 mmol/L (ref 98–111)
CO2: 30 mmol/L (ref 22–32)
Calcium: 9 mg/dL (ref 8.9–10.3)
Creatinine, Ser: 1.03 mg/dL — ABNORMAL HIGH (ref 0.44–1.00)
GFR calc Af Amer: >60 mL/min (ref >60)
GFR, EST NON AFRICAN AMERICAN: 57 mL/min — AB (ref >60)
GLUCOSE: 169 mg/dL — AB (ref 70–99)
POTASSIUM: 3.2 mmol/L — AB (ref 3.5–5.1)
Sodium: 138 mmol/L (ref 135–145)

## 2018-03-19 LAB — CBC WITH DIFFERENTIAL/PLATELET
ABS IMMATURE GRANULOCYTES: 0.06 10*3/uL (ref 0.00–0.07)
BASOS PCT: 1 %
Basophils Absolute: 0 10*3/uL (ref 0.0–0.1)
Eosinophils Absolute: 0.3 10*3/uL (ref 0.0–0.5)
Eosinophils Relative: 4 %
HCT: 44.5 % (ref 36.0–46.0)
HEMOGLOBIN: 14 g/dL (ref 12.0–15.0)
IMMATURE GRANULOCYTES: 1 %
LYMPHS ABS: 1.9 10*3/uL (ref 0.7–4.0)
Lymphocytes Relative: 23 %
MCH: 28.5 pg (ref 26.0–34.0)
MCHC: 31.5 g/dL (ref 30.0–36.0)
MCV: 90.4 fL (ref 80.0–100.0)
MONOS PCT: 6 %
Monocytes Absolute: 0.5 10*3/uL (ref 0.1–1.0)
Neutro Abs: 5.3 10*3/uL (ref 1.7–7.7)
Neutrophils Relative %: 65 %
Platelets: 225 10*3/uL (ref 150–400)
RBC: 4.92 MIL/uL (ref 3.87–5.11)
RDW: 16.7 % — ABNORMAL HIGH (ref 11.5–15.5)
WBC: 8.1 10*3/uL (ref 4.0–10.5)
nRBC: 0 % (ref 0.0–0.2)

## 2018-03-19 LAB — CULTURE, BLOOD (ROUTINE X 2)
Culture: NO GROWTH
Culture: NO GROWTH
SPECIAL REQUESTS: ADEQUATE

## 2018-03-19 LAB — COOXEMETRY PANEL
CARBOXYHEMOGLOBIN: 1.8 % — AB (ref 0.5–1.5)
METHEMOGLOBIN: 1 % (ref 0.0–1.5)
O2 SAT: 76.6 %
TOTAL HEMOGLOBIN: 14.5 g/dL (ref 12.0–16.0)

## 2018-03-19 MED ORDER — FUROSEMIDE 40 MG PO TABS
40.0000 mg | ORAL_TABLET | Freq: Every day | ORAL | Status: DC
  Administered 2018-03-19: 40 mg via ORAL
  Filled 2018-03-19: qty 1

## 2018-03-19 MED ORDER — FUROSEMIDE 40 MG PO TABS: ORAL_TABLET | ORAL | 0 refills | Status: DC

## 2018-03-19 MED ORDER — LOSARTAN POTASSIUM 25 MG PO TABS
25.0000 mg | ORAL_TABLET | Freq: Every day | ORAL | Status: DC
  Administered 2018-03-19: 25 mg via ORAL

## 2018-03-19 MED ORDER — BISOPROLOL FUMARATE 5 MG PO TABS: 2.5000 mg | ORAL_TABLET | Freq: Every day | ORAL | 0 refills | Status: DC

## 2018-03-19 MED ORDER — FUROSEMIDE 20 MG PO TABS: 20.0000 mg | ORAL_TABLET | Freq: Every evening | ORAL | Status: DC

## 2018-03-19 MED ORDER — AMIODARONE HCL 400 MG PO TABS: 400.0000 mg | ORAL_TABLET | Freq: Every day | ORAL | 1 refills | Status: DC

## 2018-03-19 MED ORDER — LOSARTAN POTASSIUM 25 MG PO TABS: 25.0000 mg | ORAL_TABLET | Freq: Every day | ORAL | 0 refills | Status: DC

## 2018-03-19 MED ORDER — SPIRONOLACTONE 25 MG PO TABS
25.0000 mg | ORAL_TABLET | Freq: Every day | ORAL | Status: DC
  Administered 2018-03-19: 25 mg via ORAL
  Filled 2018-03-19: qty 1

## 2018-03-19 MED ORDER — PREDNISONE 10 MG PO TABS: ORAL_TABLET | ORAL | 0 refills | Status: DC

## 2018-03-19 MED ORDER — PANTOPRAZOLE SODIUM 40 MG PO TBEC: 40.0000 mg | DELAYED_RELEASE_TABLET | Freq: Every day | ORAL | 0 refills | Status: DC

## 2018-03-19 MED ORDER — AMIODARONE HCL 200 MG PO TABS: ORAL_TABLET | ORAL | 0 refills | Status: DC

## 2018-03-19 MED ORDER — AMIODARONE HCL 200 MG PO TABS: 200.0000 mg | ORAL_TABLET | Freq: Every day | ORAL | Status: DC

## 2018-03-19 MED ORDER — POTASSIUM CHLORIDE CRYS ER 20 MEQ PO TBCR: 20.0000 meq | EXTENDED_RELEASE_TABLET | Freq: Every day | ORAL | 0 refills | Status: DC

## 2018-03-19 MED ORDER — POTASSIUM CHLORIDE CRYS ER 20 MEQ PO TBCR
40.0000 meq | EXTENDED_RELEASE_TABLET | ORAL | Status: AC
  Administered 2018-03-19 (×2): 40 meq via ORAL
  Filled 2018-03-19 (×2): qty 2

## 2018-03-19 MED FILL — LOSARTAN POTASSIUM 25 MG TA: 25 | 30 days supply | Qty: 30 | Fill #0

## 2018-03-19 MED FILL — POTASSIUM CL ER 20 MEQ TABL: 20 | 30 days supply | Qty: 30 | Fill #0

## 2018-03-19 MED FILL — predniSONE 10 MG TABS: 10 | 5 days supply | Qty: 7 | Fill #0

## 2018-03-19 MED FILL — BISOPROLOL FUMARATE 5 MG TA: 5 | 30 days supply | Qty: 15 | Fill #0

## 2018-03-19 MED FILL — FUROSEMIDE 40 MG TABLET: 40 | 30 days supply | Qty: 90 | Fill #0

## 2018-03-19 NOTE — Discharge Summary (Signed)
Carrie Walters, is a 62 y.o. female  DOB 26-Apr-1956  MRN [id number].  Admission date:  03/14/2018  Admitting Physician  Lorretta Harp, MD  Discharge Date:  03/19/2018   Primary MD  [name], MD  Recommendations for primary care physician for things to follow:  -Please check CBC, BMP during next visit -To keep her follow-up appointment with CHF clinic  Admission Diagnosis  CHEST PAIN SHORTNESS OF BREATH HYPOXIA   Discharge Diagnosis  CHEST PAIN SHORTNESS OF BREATH HYPOXIA    Principal Problem:   Acute on chronic combined systolic and diastolic CHF (congestive heart failure) (HCC) Active Problems:   Hypothyroidism   Atrial flutter (HCC)   Morbid obesity (HCC)   Pressure injury of skin   Diarrhea   Left upper quadrant pain      Past Medical History:  Diagnosis Date  . Anxiety   . Arthritis   . Asthma   . Atrial fibrillation (HCC)   . Cellulitis   . Dysrhythmia   . Hypertension   . Hypothyroidism     Past Surgical History:  Procedure Laterality Date  . CARDIOVERSION N/A 03/18/2018   Procedure: CARDIOVERSION;  Surgeon: [name], MD;  Location: [facility] ENDOSCOPY;  Service: Cardiovascular;  Laterality: N/A;  . CHOLECYSTECTOMY    . LEFT HEART CATH AND CORONARY ANGIOGRAPHY N/A 02/25/2018   Procedure: LEFT HEART CATH AND CORONARY ANGIOGRAPHY;  Surgeon: [name], MD;  Location: [facility] INVASIVE CV LAB;  Service: Cardiovascular;  Laterality: N/A;  . TEE WITHOUT CARDIOVERSION N/A 03/18/2018   Procedure: TRANSESOPHAGEAL ECHOCARDIOGRAM (TEE);  Surgeon: [name], MD;  Location: [facility] ENDOSCOPY;  Service: Cardiovascular;  Laterality: N/A;  . UMBILICAL HERNIA REPAIR         History of present illness and  Hospital Course:     Kindly see H&P for history of present illness and admission details, please review complete Labs, Consult reports and Test reports for all details in  brief  HPI  from the history and physical done on the day of admission 03/14/2018 HPI: Carrie Walters is a 62 y.o. female with Past medical history of a flutter, hypothyroidism, COPD, HTN, morbid obesity, recent cardiac arrest. The patient comes to the hospital with multiple complaints.  She is a transfer from [facility] where she presented with complaints of shortness of breath and left upper quadrant pain.  She denies any nausea. no Vomiting.  She denies not have any chest pain.  She reports that she has chronic diarrhea but recently has more pain and more diarrhea.  Denies any change in the stool,  No blood in the stool.  No fever reported.  But increasing cough over last few days.  She mentions this is compliant with all her medicines no pain in the leg.  She mentioned that she has some wheezing.  ED Course: ED provider at [facility] requested the patient be transferred to [facility] for cardiology evaluation.  At her baseline ambulates with support And is independent for most of her  ADL; manages her medication on her own.   Hospital Course   61 y.o.femalewith Past medical history ofa flutter, hypothyroidism, COPD, HTN, morbid obesity, recent cardiac arrest. The patient comes to the hospital with multiple complaints. She is a transfer from [facility] where she presented with complaints of shortness of breath and left upper quadrant pain, work-up significant for multiple exacerbation, acute on chronic systolic CHF.  Acute on Chronic Systolic HF. NICM -Most recent echo 02/22/2018 with EF 20 to 25%, she does have a lot of rest at home. -He has been seen by CHF team, management per them, required IV Lasix intermittently during hospital stay, her home dose Lasix has been increased on discharge, Coreg has been transitioned to bisoprolol, Entresto has been stopped given hypotension, she is discharged on losartan.  Continue with Aldactone . -Received IV Lasix  yesterday. -Cardiology will eventually need ICD   Chronic A. Fib -anticoagulation Eliquis 5 mg twice daily, on amiodarone, digoxin and bisoprolol.  she went 03/18/2018 for TEE/DCCV, currently she is in normal sinus rhythm  Left upper quadrant pain with diarrhea -There is no acute finding on CT abdomen pelvis to explain her pain, she has nonobstructive renal stone in the left kidneys, she was encouraged to use incentive spirometry, as it was felt that this will be contributing to this. -Diarrhea has resolved  History of VF arrest 22,019 -She has LifeVest at home, along with EP as an outpatient - Has LifeVest at home - EP following outpatient for possible ICD.  COPD exacerbation -Wheezing resolved, be discharged on prednisone taper  Hypertension -Blood pressure has improved after holding Entresto, resumed on low-dose losartan by cardiology, she will be discharged on bisoprolol as well  Lower extremity wound -Wound care consulted  Hypothyroidism. - Continue Synthroid.    Discharge Condition:  Stable  Follow UP  Follow-up Information    [facility]. Go on 03/28/2018.   Specialty:  Cardiology Why:  at 2:30pm in the [facility].  Please bring all medications to appt.  Gate code 1800 for November. Contact information: [address] [id number] [place] [address] [phone]       [facility]-Home Follow up.   Specialty:  Home Health Services Why:  Registered Nurse and Physical therpay Contact information: [address] [place] [address] [phone]        [facility] - Dme Follow up.   Why:  bariatric rollator Contact information: [address] [place] [address] [phone]        [name], MD Follow up.   Specialty:  Internal Medicine Contact information: [address] [place]  [address] [phone]             Discharge Instructions  and  Discharge Medications    Discharge Instructions    Discharge instructions   Complete by:  As directed    Follow with Primary MD [name], MD in 7 days   Get CBC, CMP,checked  by Primary MD next visit.    Activity: As tolerated with Full fall precautions use walker/cane & assistance as needed  Disposition Home    Diet: Heart Healthy  , with feeding assistance and aspiration precautions.  For Heart failure patients - Check your Weight same time everyday, if you gain over 2 pounds, or you develop in leg swelling, experience more shortness of breath or chest pain, call your Primary MD immediately. Follow Cardiac Low  Salt Diet and 1.5 lit/day fluid restriction.   On your next visit with your primary care physician please Get Medicines reviewed and adjusted.   Please request your Prim.MD to go over all Hospital Tests and Procedure/Radiological results at the follow up, please get all Hospital records sent to your Prim MD by signing hospital release before you go home.   If you experience worsening of your admission symptoms, develop shortness of breath, life threatening emergency, suicidal or homicidal thoughts you must seek medical attention immediately by calling 911 or calling your MD immediately  if symptoms less severe.  You Must read complete instructions/literature along with all the possible adverse reactions/side effects for all the Medicines you take and that have been prescribed to you. Take any new Medicines after you have completely understood and accpet all the possible adverse reactions/side effects.   Do not drive, operating heavy machinery, perform activities at heights, swimming or participation in water activities or provide baby sitting services if your were admitted for syncope or siezures until you have seen by Primary MD or a Neurologist and advised to do so again.  Do not drive when taking  Pain medications.    Do not take more than prescribed Pain, Sleep and Anxiety Medications  Special Instructions: If you have smoked or chewed Tobacco  in the last 2 yrs please stop smoking, stop any regular Alcohol  and or any Recreational drug use.  Wear Seat belts while driving.   Please note  You were cared for by a hospitalist during your hospital stay. If you have any questions about your discharge medications or the care you received while you were in the hospital after you are discharged, you can call the unit and asked to speak with the hospitalist on call if the hospitalist that took care of you is not available. Once you are discharged, your primary care physician will handle any further medical issues. Please note that NO REFILLS for any discharge medications will be authorized once you are discharged, as it is imperative that you return to your primary care physician (or establish a relationship with a primary care physician if you do not have one) for your aftercare needs so that they can reassess your need for medications and monitor your lab values.   Increase activity slowly   Complete by:  As directed      Allergies as of 03/19/2018      Reactions   Metoprolol Tartrate Shortness Of Breath   Biaxin [clarithromycin] Other (See Comments)   Sores in mouth   Levaquin [levofloxacin] Other (See Comments)   Sores in mouth   Contrast Media [iodinated Diagnostic Agents] Rash      Medication List    STOP taking these medications   carvedilol 6.25 MG tablet Commonly known as:  COREG   sacubitril-valsartan 24-26 MG Commonly known as:  ENTRESTO     TAKE these medications   albuterol 108 (90 Base) MCG/ACT inhaler Commonly known as:  PROVENTIL HFA;VENTOLIN HFA Inhale 2 puffs into the lungs every 6 (six) hours as needed for wheezing or shortness of breath.   amiodarone 400 MG tablet Commonly known as:  PACERONE Take 1 tablet (400 mg total) by mouth daily. Needs to see Dr.  Elberta Fortis with TSH recheck prior to further refills What changed:  Another medication with the same name was added. Make sure you understand how and when to take each.   amiodarone 200 MG tablet Commonly known as:  PACERONE Amiodarone 200 mg  twice a day until 03/24/2018 then decrease Amiodarone 200 mg daily What changed:  You were already taking a medication with the same name, and this prescription was added. Make sure you understand how and when to take each.   apixaban 5 MG Tabs tablet Commonly known as:  ELIQUIS Take 1 tablet (5 mg total) by mouth 2 (two) times daily.   bisoprolol 5 MG tablet Commonly known as:  ZEBETA Take 0.5 tablets (2.5 mg total) by mouth daily. Start taking on:  03/20/2018   digoxin 0.125 MG tablet Commonly known as:  LANOXIN Take 1 tablet (0.125 mg total) by mouth daily.   furosemide 40 MG tablet Commonly known as:  LASIX These take 40 mg oral daily, and 20 mg oral every evening What changed:    how much to take  how to take this  when to take this  additional instructions   levothyroxine 25 MCG tablet Commonly known as:  SYNTHROID, LEVOTHROID Take 1 tablet (25 mcg total) by mouth daily before breakfast. Needs PCP/cardiologist visit with TSH check for further refills   losartan 25 MG tablet Commonly known as:  COZAAR Take 1 tablet (25 mg total) by mouth daily. Start taking on:  03/20/2018   pantoprazole 40 MG tablet Commonly known as:  PROTONIX Take 1 tablet (40 mg total) by mouth daily. Start taking on:  03/20/2018   potassium chloride SA 20 MEQ tablet Commonly known as:  K-DUR,KLOR-CON Take 1 tablet (20 mEq total) by mouth daily.   predniSONE 10 MG tablet Commonly known as:  DELTASONE Please take 20 mg oral daily for 2 days, then 10 mg oral daily for 3 days, then stop   spironolactone 25 MG tablet Commonly known as:  ALDACTONE Take 1 tablet (25 mg total) by mouth daily.            Durable Medical Equipment  (From admission,  onward)         Start     Ordered   03/18/18 1622  For home use only DME 4 wheeled rolling walker with seat  Once    Comments:  bariatric  Question:  Patient needs a walker to treat with the following condition  Answer:  Mobility impaired   03/18/18 1622            Diet and Activity recommendation: See Discharge Instructions above   Consults obtained -  Cardiology/CHF team  Major procedures and Radiology Reports - PLEASE review detailed and final reports for all details, in brief -     S/P TEE DC-CV 10/28. Maintaining NSR  Ct Abdomen Pelvis W Contrast  Result Date: 03/15/2018 CLINICAL DATA:  Acute left-sided abdominal pain. EXAM: CT ABDOMEN AND PELVIS WITH CONTRAST TECHNIQUE: Multidetector CT imaging of the abdomen and pelvis was performed using the standard protocol following bolus administration of intravenous contrast. CONTRAST:  ISOVUE-300 IOPAMIDOL (ISOVUE-300) INJECTION 61% COMPARISON:  None. FINDINGS: Lower chest: Mild bibasilar subsegmental atelectasis is noted. Hepatobiliary: No focal liver abnormality is seen. Status post cholecystectomy. No biliary dilatation. Pancreas: Unremarkable. No pancreatic ductal dilatation or surrounding inflammatory changes. Spleen: Normal in size without focal abnormality. Adrenals/Urinary Tract: Adrenal glands appear normal. Small nonobstructive left renal calculus is noted. No hydronephrosis or renal obstruction is noted. No ureteral calculi are noted. Urinary bladder is unremarkable. Stomach/Bowel: Stomach is within normal limits. Appendix appears normal. No evidence of bowel wall thickening, distention, or inflammatory changes. Vascular/Lymphatic: No significant vascular abnormality is noted. 2 cm left external iliac lymph node is  noted. Reproductive: Uterus and bilateral adnexa are unremarkable. Other: No abnormal fluid collection or ascites is noted. Abdominal wall laxity is noted. Musculoskeletal: No acute or significant osseous  findings. IMPRESSION: Small nonobstructive left renal calculus. No hydronephrosis or renal obstruction is noted. 2 cm left external iliac lymph node is noted which most likely is inflammatory or reactive in etiology. No other significant abnormality seen in the abdomen or pelvis. Electronically Signed   By: Lupita Raider, M.D.   On: 03/15/2018 11:50   Dg Chest Port 1 View  Result Date: 03/14/2018 CLINICAL DATA:  PICC line placement EXAM: PORTABLE CHEST 1 VIEW COMPARISON:  03/14/2018 FINDINGS: Cardiomegaly with vascular congestion. Bibasilar atelectasis. Mild interstitial prominence could reflect early interstitial edema. Right PICC line is in place with the tip in the SVC. IMPRESSION: Cardiomegaly with vascular congestion and probable early interstitial edema. Bibasilar atelectasis. Electronically Signed   By: Charlett Nose M.D.   On: 03/14/2018 22:16   Dg Chest Port 1 View  Result Date: 03/14/2018 CLINICAL DATA:  Shortness of breath. EXAM: PORTABLE CHEST 1 VIEW COMPARISON:  None. FINDINGS: The heart size and mediastinal contours are within normal limits. No pneumothorax or pleural effusion is noted. Minimal bibasilar subsegmental atelectasis or scarring is noted. The visualized skeletal structures are unremarkable. IMPRESSION: Minimal bibasilar subsegmental atelectasis or scarring. Electronically Signed   By: Lupita Raider, M.D.   On: 03/14/2018 14:07   [place] Ekg Site Rite  Result Date: 03/14/2018 If Site Rite image not attached, placement could not be confirmed due to current cardiac rhythm.   Micro Results     Recent Results (from the past 240 hour(s))  MRSA PCR Screening     Status: None   Collection Time: 03/14/18  3:12 PM  Result Value Ref Range Status   MRSA by PCR NEGATIVE NEGATIVE Final    Comment:        The GeneXpert MRSA Assay (FDA approved for NASAL specimens only), is one component of a comprehensive MRSA colonization surveillance program. It is not intended to diagnose  MRSA infection nor to guide or monitor treatment for MRSA infections. Performed at [facility], [address], [place], [place] [address]   Respiratory Panel by PCR     Status: None   Collection Time: 03/14/18  3:12 PM  Result Value Ref Range Status   Adenovirus NOT DETECTED NOT DETECTED Final   Coronavirus 229E NOT DETECTED NOT DETECTED Final   Coronavirus HKU1 NOT DETECTED NOT DETECTED Final   Coronavirus NL63 NOT DETECTED NOT DETECTED Final   Coronavirus OC43 NOT DETECTED NOT DETECTED Final   Metapneumovirus NOT DETECTED NOT DETECTED Final   Rhinovirus / Enterovirus NOT DETECTED NOT DETECTED Final   Influenza A NOT DETECTED NOT DETECTED Final   Influenza B NOT DETECTED NOT DETECTED Final   Parainfluenza Virus 1 NOT DETECTED NOT DETECTED Final   Parainfluenza Virus 2 NOT DETECTED NOT DETECTED Final   Parainfluenza Virus 3 NOT DETECTED NOT DETECTED Final   Parainfluenza Virus 4 NOT DETECTED NOT DETECTED Final   Respiratory Syncytial Virus NOT DETECTED NOT DETECTED Final   Bordetella pertussis NOT DETECTED NOT DETECTED Final   Chlamydophila pneumoniae NOT DETECTED NOT DETECTED Final   Mycoplasma pneumoniae NOT DETECTED NOT DETECTED Final    Comment: Performed at [facility], [address] [place], [place] [address]  Culture, blood (x 2)     Status: None (Preliminary result)   Collection Time: 03/14/18  6:09 PM  Result Value Ref Range Status   Specimen Description BLOOD RIGHT ARM  Final   Special Requests   Final    BOTTLES DRAWN AEROBIC ONLY Blood Culture results may not be optimal due to an inadequate volume of blood received in culture bottles   Culture   Final    NO GROWTH 4 DAYS Performed at [facility], [address], [place], [place] [address]    Report Status PENDING  Incomplete  Culture, blood (x 2)     Status: None (Preliminary result)   Collection Time: 03/14/18  6:13 PM  Result Value Ref Range Status   Specimen Description BLOOD RIGHT  FOREARM  Final   Special Requests   Final    BOTTLES DRAWN AEROBIC ONLY Blood Culture adequate volume   Culture   Final    NO GROWTH 4 DAYS Performed at [facility], [address], [place], [place] [address]    Report Status PENDING  Incomplete       Today   Subjective:   Carrie Walters today has no headache,no chest abdominal pain,no new weakness tingling or numbness, feels much better wants to go home today.   Objective:   Blood pressure (!) 97/59, pulse 64, temperature 98.2 F (36.8 C), temperature source Oral, resp. rate 18, height 5\' 6"  (1.676 m), weight 122 kg, SpO2 98 %.   Intake/Output Summary (Last 24 hours) at 03/19/2018 1126 Last data filed at 03/19/2018 1122 Gross per 24 hour  Intake 1300 ml  Output 1300 ml  Net 0 ml    Exam Awake Alert, Oriented x 3, No new F.N deficits, Normal affect Symmetrical Chest wall movement, Good air movement bilaterally, CTAB RRR,No Gallops,Rubs or new Murmurs, No Parasternal Heave +ve B.Sounds, Abd Soft, Non tender, , No rebound -guarding or rigidity. No Cyanosis, Clubbing, left lower extremity wrapped  Data Review   CBC w Diff:  Lab Results  Component Value Date   WBC 8.1 03/19/2018   HGB 14.0 03/19/2018   HGB 12.2 12/27/2017   HCT 44.5 03/19/2018   HCT 37.9 12/27/2017   PLT 225 03/19/2018   PLT 236 12/27/2017   LYMPHOPCT 23 03/19/2018   MONOPCT 6 03/19/2018   EOSPCT 4 03/19/2018   BASOPCT 1 03/19/2018    CMP:  Lab Results  Component Value Date   NA 138 03/19/2018   NA 142 12/27/2017   K 3.2 (L) 03/19/2018   CL 99 03/19/2018   CO2 30 03/19/2018   BUN 17 03/19/2018   BUN 10 12/27/2017   CREATININE 1.03 (H) 03/19/2018   PROT 7.1 02/21/2018   PROT 6.5 12/27/2017   ALBUMIN 3.6 02/21/2018   ALBUMIN 3.7 12/27/2017   BILITOT 1.1 02/21/2018   BILITOT 1.7 (H) 12/27/2017   ALKPHOS 87 02/21/2018   AST 37 02/21/2018   ALT 23 02/21/2018  .   Total Time in preparing paper work, data evaluation and  todays exam - 35 minutes  Huey Bienenstock M.D on 03/19/2018 at 11:26 AM  [facility]   Office  [phone]

## 2018-03-19 NOTE — Progress Notes (Signed)
Patient has follow-up appt scheduled in the [facility] for 03/28/18 at 2:30pm.

## 2018-03-19 NOTE — Progress Notes (Signed)
Patient ID: Carrie Walters, female   DOB: [birth year], 62 y.o.   MRN: [id number]     Advanced Heart Failure Rounding Note  PCP-Cardiologist: Garwin Brothers, MD   Subjective:    S/P TEE DC-CV. Maintaining NSR.   Yesterday diuresed with IV lasix. Negative 1.3 liters. Weight down 6 pounds.   Feeling better. Denies SOB.   TEE: EF 30-35% with mild LV dilation, RV mild to moderately dilated with mild to moderately decreased systolic function.    Objective:   Weight Range: 122 kg Body mass index is 43.41 kg/m.   Vital Signs:   Temp:  [97.6 F (36.4 C)-98.7 F (37.1 C)] 98.4 F (36.9 C) (10/29 0727) Pulse Rate:  [65-82] 65 (10/29 0727) Resp:  [18-26] 26 (10/29 0727) BP: (95-139)/(58-89) 110/64 (10/29 0727) SpO2:  [93 %-100 %] 93 % (10/29 0727) Weight:  [122 kg] 122 kg (10/29 0300) Last BM Date: 03/17/18  Weight change: Filed Weights   03/17/18 0314 03/18/18 0319 03/19/18 0300  Weight: 125.5 kg 124.4 kg 122 kg    Intake/Output:   Intake/Output Summary (Last 24 hours) at 03/19/2018 0751 Last data filed at 03/19/2018 0700 Gross per 24 hour  Intake 570 ml  Output 1950 ml  Net -1380 ml      Physical Exam   CVP 2-3  General:   No resp difficult. Sitting in the chair.  HEENT: normal Neck: supple. no JVD. Carotids 2+ bilat; no bruits. No lymphadenopathy or thryomegaly appreciated. Cor: PMI nondisplaced. Regular rate & rhythm. No rubs, gallops or murmurs. Lungs: clear Abdomen: soft, nontender, nondistended. No hepatosplenomegaly. No bruits or masses. Good bowel sounds. Extremities: no cyanosis, clubbing, rash, LLE dressing in place. RLE erythema.  Neuro: alert & orientedx3, cranial nerves grossly intact. moves all 4 extremities w/o difficulty. Affect pleasant  Telemetry  NSR 60s personally reviewed  EKG    No new tracings.    Labs    CBC Recent Labs    03/18/18 0450 03/19/18 0500  WBC 8.8 8.1  NEUTROABS 5.5 5.3  HGB 13.5 14.0  HCT 45.1 44.5  MCV 90.4 90.4   PLT 279 225   Basic Metabolic Panel Recent Labs    40/98/11 0450 03/19/18 0500  NA 140 138  K 3.8 3.2*  CL 104 99  CO2 30 30  GLUCOSE 129* 169*  BUN 18 17  CREATININE 0.92 1.03*  CALCIUM 9.0 9.0  MG 2.3  --    Liver Function Tests No results for input(s): AST, ALT, ALKPHOS, BILITOT, PROT, ALBUMIN in the last 72 hours. No results for input(s): LIPASE, AMYLASE in the last 72 hours. Cardiac Enzymes No results for input(s): CKTOTAL, CKMB, CKMBINDEX, TROPONINI in the last 72 hours.  BNP: BNP (last 3 results) Recent Labs    02/21/18 2315 03/14/18 1154  BNP 334.5* 99.7    ProBNP (last 3 results) No results for input(s): PROBNP in the last 8760 hours.   D-Dimer No results for input(s): DDIMER in the last 72 hours. Hemoglobin A1C No results for input(s): HGBA1C in the last 72 hours. Fasting Lipid Panel No results for input(s): CHOL, HDL, LDLCALC, TRIG, CHOLHDL, LDLDIRECT in the last 72 hours. Thyroid Function Tests No results for input(s): TSH, T4TOTAL, T3FREE, THYROIDAB in the last 72 hours.  Invalid input(s): FREET3  Other results:   Imaging    No results found.   Medications:     Scheduled Medications: . amiodarone  200 mg Oral BID  . apixaban  5 mg Oral BID  .  bisoprolol  2.5 mg Oral Daily  . digoxin  0.125 mg Oral Daily  . furosemide  40 mg Intravenous BID  . levothyroxine  25 mcg Oral QAC breakfast  . losartan  12.5 mg Oral Daily  . pantoprazole  40 mg Oral Daily  . potassium chloride  40 mEq Oral Q4H  . predniSONE  20 mg Oral Q breakfast  . sodium chloride flush  10-40 mL Intracatheter Q12H  . sodium chloride flush  3 mL Intravenous Q12H  . sodium chloride flush  3 mL Intravenous Q12H  . spironolactone  12.5 mg Oral Daily    Infusions: . sodium chloride    . sodium chloride      PRN Medications: sodium chloride, acetaminophen, albuterol, benzonatate, ondansetron (ZOFRAN) IV, sodium chloride flush, sodium chloride flush, sodium chloride  flush    Patient Profile   Carrie Walters is a 62 y.o. female with a history of chronic atrial fibrillation, hypothyroidism, COPD, HTN, morbid obesity, VF arrest 02/21/18, and systolic heart failure due to NICM (diagnosed 02/2018)  Transferred from [facility] to [facility] with SOB and hypoxia.  Assessment/Plan   1. Dyspnea:   Suspect combination of CHF and COPD exacerbation.   - Steroids per primary service.  - Resolved.  2. Hypotension: BP improved now off Entresto.  Doubt sepsis.   Resolved.  3. Acute on chronic systolic CHF: Nonischemic cardiomyopathy (cath in 10/19 without significant disease).  Echo in 10/19 with EF 25-30%.  As above, volume difficult on exam but CVP not elevated (~12 today). BP low at admission, possibly due to [facility]. CO-OX 76%.  - CVP 2-3. Stop IV lasix. Will use Lasix 40 qam/20 qpm for home.  - Continue bisoprolol given beta-1 selectivity. - Increase spironolactone 25 mg daily.  - Continue 0.125 digoxin.   - Can increase losartan to 25 mg daily.   - Will eventually need ICD (not CRT candidate) given recent VT arrest.  Needs healing of leg ulcers first.  4. Atrial fibrillation:  Persistent, not sure how long.  She has had atrial fibrillation for up to 5 years, but not sure if it comes and goes or is chronic.   S/P TEE DC-CV 10/28. Maintaining NSR.  - Continue amiodarone 200 mg twice a day until 03/24/2018 then switch to amiodarone 200 mg daily - Continue eliquis 5 mg twice a day.  5. COPD: Suspect COPD exacerbation.  Per primary service.  She no longer smokes.  No change.  6. VF arrest: In 10/19.  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  Have been holding off on ICD until legs heal, which will likely take a while.  She cannot stop anticoagulation for ICD placement until she is 1 month post-DCCV.  7. Lower leg ulcerations: Suspect venous stasis changes.   - Wound following  - Peripheral arterial dopplers did not show significant disease.  8. LUQ pain: CT  unremarkable. No change.  9. Deconditioning: Continue PT.   Looks good today.  Should be able to discharge.  Lasix 40 mg qam/20 qpm Bisoprolol 2.5 mg daily Spironolactone 25 mg daily Losartan 25 mg daily Amiodarone 200 mg twice a day until 03/24/2018 then decrease Amiodarone 200 mg daily Eliquis 5 mg twice a day KDur  20 daily  HF follow up will be set up.   [name] NP-C  03/19/2018 7:51 AM  Advanced Heart Failure Team Pager [phone] (M-F; 7a - 4p)  Please contact [facility] for night-coverage after hours (4p -7a ) and weekends on  [email or web address]  Patient seen with NP, agree with the above note.   She is maintaining NSR post-DCCV.  CVP 3 today, co-ox 76%.  She feels good this morning.  Walking around room without dyspnea. She is not volume overloaded on exam.   We are making the medication changes described above.   She can go home on the above regimen of cardiac medications.  We will arrange CHF clinic followup.   Marca Ancona 03/19/2018 8:26 AM

## 2018-03-19 NOTE — Progress Notes (Addendum)
Pt given discharge instructions with understanding, pt provided with walker, scale and other items from heart failure clinic. Pt has no questions at this time. Pt placed life vest on self.

## 2018-03-19 NOTE — Care Management Note (Addendum)
Case Management Note  Patient Details  Name: Carrie Walters MRN: [id number] Date of Birth: 1956-05-21   Subjective/Objective:  Pt is a readmit - this admit pt had chest pressure                   Action/Plan:  PTA from home active with [facility] for [facility] only (PT performed eval post last discharge and did not continue PT per [facility]).  Pt also has life vest - liaison made aware of pts hospitalization.  CM will continue to follow for discharge needs   Expected Discharge Date:                  Expected Discharge Plan:  Home w Home Health Services  In-House Referral:     Discharge planning Services  CM Consult  Post Acute Care Choice:  Resumption of Svcs/PTA Provider Choice offered to:     DME Arranged:  Walker rolling with seat(bariatric) DME Agency:  [facility]  HH Arranged:  RN, PT [facility] Agency:  [facility]  Status of Service:  In process, will continue to follow  If discussed at Long Length of Stay Meetings, dates discussed:    Additional Comments: 03/19/2018  [place] Sheer is not a preferred drug for medicaid - HF team following up   Bedside nurse confirmed pt has life vest in the room - pt denied question and concerns regarding reapplication and care of life vest.  Pt educated on importance of daily weights and low sodium diet  03/18/18 DME agency choice given - [facility] chosen - referral accepted and agency informed pt my discharge home today.  Life Vest liaison contacted and informed pt may discharge home today - no interventions needed  03/15/18 CM informed [facility] that HHPT is now also recommended - [facility] will follow pt for both RN and PT Cherylann Parr, RN 03/19/2018, 9:33 AM

## 2018-03-19 NOTE — Telephone Encounter (Signed)
Bisoprolol PA approved by  Medicaid through 03/19/19.   Tyler Deis. Bonnye Fava, PharmD, BCPS, CPP Clinical Pharmacist Phone: [phone] 03/19/2018 4:45 PM

## 2018-03-19 NOTE — Discharge Instructions (Signed)
Follow with Primary MD [name], MD in 7 days   Get CBC, CMP,checked  by Primary MD next visit.    Activity: As tolerated with Full fall precautions use walker/cane & assistance as needed  Disposition Home    Diet: Heart Healthy  , with feeding assistance and aspiration precautions.  For Heart failure patients - Check your Weight same time everyday, if you gain over 2 pounds, or you develop in leg swelling, experience more shortness of breath or chest pain, call your Primary MD immediately. Follow Cardiac Low Salt Diet and 1.5 lit/day fluid restriction.   On your next visit with your primary care physician please Get Medicines reviewed and adjusted.   Please request your Prim.MD to go over all Hospital Tests and Procedure/Radiological results at the follow up, please get all Hospital records sent to your Prim MD by signing hospital release before you go home.   If you experience worsening of your admission symptoms, develop shortness of breath, life threatening emergency, suicidal or homicidal thoughts you must seek medical attention immediately by calling 911 or calling your MD immediately  if symptoms less severe.  You Must read complete instructions/literature along with all the possible adverse reactions/side effects for all the Medicines you take and that have been prescribed to you. Take any new Medicines after you have completely understood and accpet all the possible adverse reactions/side effects.   Do not drive, operating heavy machinery, perform activities at heights, swimming or participation in water activities or provide baby sitting services if your were admitted for syncope or siezures until you have seen by Primary MD or a Neurologist and advised to do so again.  Do not drive when taking Pain medications.    Do not take more than prescribed Pain, Sleep and Anxiety Medications  Special Instructions: If you have smoked or chewed Tobacco  in the last 2 yrs please  stop smoking, stop any regular Alcohol  and or any Recreational drug use.  Wear Seat belts while driving.   Please note  You were cared for by a hospitalist during your hospital stay. If you have any questions about your discharge medications or the care you received while you were in the hospital after you are discharged, you can call the unit and asked to speak with the hospitalist on call if the hospitalist that took care of you is not available. Once you are discharged, your primary care physician will handle any further medical issues. Please note that NO REFILLS for any discharge medications will be authorized once you are discharged, as it is imperative that you return to your primary care physician (or establish a relationship with a primary care physician if you do not have one) for your aftercare needs so that they can reassess your need for medications and monitor your lab values.

## 2018-03-28 ENCOUNTER — Inpatient Hospital Stay (HOSPITAL_COMMUNITY): Payer: Medicaid Other

## 2018-04-02 ENCOUNTER — Ambulatory Visit (HOSPITAL_COMMUNITY)
Admit: 2018-04-02 | Discharge: 2018-04-02 | Disposition: A | Discharge status: Home / Self Care | Payer: Medicaid Other | Source: Ambulatory Visit | Attending: Cardiology | Admitting: Cardiology

## 2018-04-02 ENCOUNTER — Telehealth (HOSPITAL_COMMUNITY): Payer: Self-pay | Admitting: Licensed Clinical Social Worker

## 2018-04-02 ENCOUNTER — Other Ambulatory Visit (HOSPITAL_COMMUNITY): Payer: Self-pay | Admitting: Pharmacist

## 2018-04-02 VITALS — BP 118/76 | HR 72 | Wt 275.8 lb

## 2018-04-02 DIAGNOSIS — J449 Chronic obstructive pulmonary disease, unspecified: Secondary | ICD-10-CM

## 2018-04-02 DIAGNOSIS — M1990 Unspecified osteoarthritis, unspecified site: Secondary | ICD-10-CM | POA: Diagnosis not present

## 2018-04-02 DIAGNOSIS — Z8249 Family history of ischemic heart disease and other diseases of the circulatory system: Secondary | ICD-10-CM | POA: Insufficient documentation

## 2018-04-02 DIAGNOSIS — I5020 Unspecified systolic (congestive) heart failure: Secondary | ICD-10-CM | POA: Diagnosis present

## 2018-04-02 DIAGNOSIS — Z79899 Other long term (current) drug therapy: Secondary | ICD-10-CM | POA: Diagnosis not present

## 2018-04-02 DIAGNOSIS — R0683 Snoring: Secondary | ICD-10-CM | POA: Diagnosis not present

## 2018-04-02 DIAGNOSIS — I5042 Chronic combined systolic (congestive) and diastolic (congestive) heart failure: Secondary | ICD-10-CM

## 2018-04-02 DIAGNOSIS — E039 Hypothyroidism, unspecified: Secondary | ICD-10-CM | POA: Diagnosis not present

## 2018-04-02 DIAGNOSIS — I4901 Ventricular fibrillation: Secondary | ICD-10-CM

## 2018-04-02 DIAGNOSIS — Z7901 Long term (current) use of anticoagulants: Secondary | ICD-10-CM | POA: Insufficient documentation

## 2018-04-02 DIAGNOSIS — I5022 Chronic systolic (congestive) heart failure: Secondary | ICD-10-CM | POA: Insufficient documentation

## 2018-04-02 DIAGNOSIS — Z87891 Personal history of nicotine dependence: Secondary | ICD-10-CM | POA: Insufficient documentation

## 2018-04-02 DIAGNOSIS — I110 Hypertensive heart disease with heart failure: Secondary | ICD-10-CM | POA: Insufficient documentation

## 2018-04-02 DIAGNOSIS — Z8674 Personal history of sudden cardiac arrest: Secondary | ICD-10-CM | POA: Insufficient documentation

## 2018-04-02 DIAGNOSIS — I480 Paroxysmal atrial fibrillation: Secondary | ICD-10-CM | POA: Diagnosis not present

## 2018-04-02 DIAGNOSIS — I428 Other cardiomyopathies: Secondary | ICD-10-CM | POA: Diagnosis not present

## 2018-04-02 DIAGNOSIS — I5021 Acute systolic (congestive) heart failure: Secondary | ICD-10-CM | POA: Diagnosis not present

## 2018-04-02 DIAGNOSIS — R0602 Shortness of breath: Secondary | ICD-10-CM | POA: Diagnosis not present

## 2018-04-02 DIAGNOSIS — Z79890 Hormone replacement therapy (postmenopausal): Secondary | ICD-10-CM | POA: Diagnosis not present

## 2018-04-02 DIAGNOSIS — Z6841 Body Mass Index (BMI) 40.0 and over, adult: Secondary | ICD-10-CM | POA: Insufficient documentation

## 2018-04-02 DIAGNOSIS — I509 Heart failure, unspecified: Secondary | ICD-10-CM | POA: Insufficient documentation

## 2018-04-02 DIAGNOSIS — I5032 Chronic diastolic (congestive) heart failure: Secondary | ICD-10-CM | POA: Insufficient documentation

## 2018-04-02 LAB — CBC
HCT: 45.3 % (ref 36.0–46.0)
Hemoglobin: 14.2 g/dL (ref 12.0–15.0)
MCH: 28.7 pg (ref 26.0–34.0)
MCHC: 31.3 g/dL (ref 30.0–36.0)
MCV: 91.5 fL (ref 80.0–100.0)
PLATELETS: 246 10*3/uL (ref 150–400)
RBC: 4.95 MIL/uL (ref 3.87–5.11)
RDW: 16.9 % — ABNORMAL HIGH (ref 11.5–15.5)
WBC: 17 10*3/uL — ABNORMAL HIGH (ref 4.0–10.5)
nRBC: 0 % (ref 0.0–0.2)

## 2018-04-02 LAB — BRAIN NATRIURETIC PEPTIDE: B NATRIURETIC PEPTIDE 5: 187.8 pg/mL — AB (ref 0.0–100.0)

## 2018-04-02 LAB — MAGNESIUM: Magnesium: 2.2 mg/dL (ref 1.7–2.4)

## 2018-04-02 LAB — TSH: TSH: 17.622 u[IU]/mL — ABNORMAL HIGH (ref 0.350–4.500)

## 2018-04-02 LAB — BASIC METABOLIC PANEL
Anion gap: 9 (ref 5–15)
BUN: 13 mg/dL (ref 8–23)
CO2: 26 mmol/L (ref 22–32)
CREATININE: 0.96 mg/dL (ref 0.44–1.00)
Calcium: 10.1 mg/dL (ref 8.9–10.3)
Chloride: 103 mmol/L (ref 98–111)
GFR calc Af Amer: >60 mL/min (ref >60)
GFR calc non Af Amer: >60 mL/min (ref >60)
GLUCOSE: 233 mg/dL — AB (ref 70–99)
Potassium: 4.2 mmol/L (ref 3.5–5.1)
SODIUM: 138 mmol/L (ref 135–145)

## 2018-04-02 LAB — DIGOXIN LEVEL: Digoxin Level: 0.5 ng/mL — ABNORMAL LOW (ref 0.8–2.0)

## 2018-04-02 MED ORDER — LOSARTAN POTASSIUM 25 MG PO TABS: 25.0000 mg | ORAL_TABLET | Freq: Every day | ORAL | 5 refills | Status: DC

## 2018-04-02 MED ORDER — POTASSIUM CHLORIDE CRYS ER 20 MEQ PO TBCR: 20.0000 meq | EXTENDED_RELEASE_TABLET | Freq: Every day | ORAL | 5 refills | Status: DC

## 2018-04-02 MED ORDER — APIXABAN 5 MG PO TABS: 5.0000 mg | ORAL_TABLET | Freq: Two times a day (BID) | ORAL | 11 refills | Status: DC

## 2018-04-02 MED ORDER — AMIODARONE HCL 200 MG PO TABS: 200.0000 mg | ORAL_TABLET | Freq: Every day | ORAL | 5 refills | Status: DC

## 2018-04-02 MED ORDER — AMIODARONE HCL 200 MG PO TABS: 200.0000 mg | ORAL_TABLET | Freq: Every day | ORAL | 0 refills | Status: DC

## 2018-04-02 MED ORDER — SPIRONOLACTONE 25 MG PO TABS: 25.0000 mg | ORAL_TABLET | Freq: Every day | ORAL | 6 refills | Status: DC

## 2018-04-02 MED ORDER — DIGOXIN 125 MCG PO TABS: 0.1250 mg | ORAL_TABLET | Freq: Every day | ORAL | 5 refills | Status: DC

## 2018-04-02 MED ORDER — BISOPROLOL FUMARATE 5 MG PO TABS: 2.5000 mg | ORAL_TABLET | Freq: Every day | ORAL | 5 refills | Status: DC

## 2018-04-02 MED ORDER — FUROSEMIDE 40 MG PO TABS: ORAL_TABLET | ORAL | 5 refills | Status: DC

## 2018-04-02 NOTE — Telephone Encounter (Signed)
CSW consulted to assist with transportation concerns.  Patient normally depends on her daughter for transportation to get medications but she will be out of the state for several months.  Pt has [facility] but does not think they will transport her for non-medical appt related needs as she lives far out of town- CSW encouraged her to call [facility] to see if that is the case.  CSW also discussed possibility of mail order pharmacy- pt agreeable to this plan and thinks it would be very helpful in pt getting medications more consistently- CSW referred to clinic pharmacist who will follow up regarding [facility]  CSW will continue to follow in clinic and assist as needed  Burna Sis, LCSW Clinical Social Worker [facility] [phone]

## 2018-04-02 NOTE — Progress Notes (Signed)
PCP: Dr Philipp Deputy Primary Cardiologist: Dr Tomie China HF MD: Dr Shirlee Latch   HPI: Carrie Walters a 62 y.o.femalewith a history of chronic atrial fibrillation, hypothyroidism, COPD, HTN, morbid obesity, VF arrest 02/21/18, and systolic heart failure due to NICM (diagnosed 02/2018)  Admitted 10/3-10/11/19. She had a VF arrest at [facility] on 02/21/18. She received CPR, 1 amp epi, and shock x1 with ROSC. She was then in Afib RVR and started on IV amio prior to transfer to [facility]. Echo showed newly reduced EF 25-30%. She had a LHC, which showed normal coronaries. EP was consulted and thought hypokalemia may have triggered VF arrest. She was transitioned to PO amio and given a LifeVest at DC. Unable to place ICD due to active leg wound. She required diuresis with IV lasix and then transitioned to lasix 40 mg daily. HF meds were optimized. DC weight 273 lbs.   Admitted to [facility] on 10/24 from [facility] increased dyspnea and respiratory failure. Hypotensive so entresto, coreg, and lasix held. Underwent TEE and DC_CV with restoration of NSR. She continued on amiodarone 200 mg twice a day. Discharged with Life Vest back on. Needs ICD but due to lower extremity wounds this has been delayed until the wounds heal. Discharge on 10/29.   She returned to [facility] 03/31/18 with increased shortness of breath. She was given prednisone and discharged to home.  She was to start prednisone bur has not picked up because she was unable to pick up from the pharmacy.   Today she returns for post hospital follow up. Overall feeling ok. She has not been able to pick prednisone as noted above. SOB with exertion.  Denies PND/Orthopnea. Appetite ok. No fever or chills. Snores a lot. Denies bleeding problems. Weight at home 269 pounds. Followed by Dr Ardyth Harps for lower extremity wounds.  Taking all medications. Lives with her daughter.  Transportation issues. She has no car and requires assistance with  transportation.  She has difficulty going to get medications and food. She uses [facility]. She plans to move back to [place] this summer due to transportation barriers in [place].  Cardiac Studies   03/18/18 S/P DC-CV 200J --> NSR   03/18/18 TEE: EF 30-35% with mild LV dilation, RV mild to moderately dilated with mild to moderately decreased systolic function.   02/2018 LHC /RHC Normal coronaries EF 20%   ROS: All systems negative except as listed in HPI, PMH and Problem List.  SH:  Social History   Socioeconomic History  . Marital status: Single    Spouse name: Not on file  . Number of children: Not on file  . Years of education: Not on file  . Highest education level: Not on file  Occupational History  . Not on file  Social Needs  . Financial resource strain: Not on file  . Food insecurity:    Worry: Not on file    Inability: Not on file  . Transportation needs:    Medical: Not on file    Non-medical: Not on file  Tobacco Use  . Smoking status: Former Games developer  . Smokeless tobacco: Never Used  Substance and Sexual Activity  . Alcohol use: Never    Frequency: Never  . Drug use: Never  . Sexual activity: Not on file  Lifestyle  . Physical activity:    Days per week: Not on file    Minutes per session: Not on file  . Stress: Not on file  Relationships  . Social connections:  Talks on phone: Not on file    Gets together: Not on file    Attends religious service: Not on file    Active member of club or organization: Not on file    Attends meetings of clubs or organizations: Not on file    Relationship status: Not on file  . Intimate partner violence:    Fear of current or ex partner: Not on file    Emotionally abused: Not on file    Physically abused: Not on file    Forced sexual activity: Not on file  Other Topics Concern  . Not on file  Social History Narrative   Moved from [place]    FH:  Family History  Problem Relation Age of Onset  . Heart  disease Father   . Atrial fibrillation Brother     Past Medical History:  Diagnosis Date  . Anxiety   . Arthritis   . Asthma   . Atrial fibrillation (HCC)   . Cellulitis   . Dysrhythmia   . Hypertension   . Hypothyroidism     Current Outpatient Medications  Medication Sig Dispense Refill  . albuterol (PROVENTIL HFA;VENTOLIN HFA) 108 (90 Base) MCG/ACT inhaler Inhale 2 puffs into the lungs every 6 (six) hours as needed for wheezing or shortness of breath.    Marland Kitchen amiodarone (PACERONE) 200 MG tablet Amiodarone 200 mg twice a day until 03/24/2018 then decrease Amiodarone 200 mg daily 50 tablet 0  . apixaban (ELIQUIS) 5 MG TABS tablet Take 1 tablet (5 mg total) by mouth 2 (two) times daily. 60 tablet 11  . bisoprolol (ZEBETA) 5 MG tablet Take 0.5 tablets (2.5 mg total) by mouth daily. 30 tablet 0  . digoxin (LANOXIN) 0.125 MG tablet Take 1 tablet (0.125 mg total) by mouth daily. 30 tablet 3  . furosemide (LASIX) 40 MG tablet These take 40 mg oral daily, and 20 mg oral every evening 90 tablet 0  . levothyroxine (SYNTHROID, LEVOTHROID) 25 MCG tablet Take 1 tablet (25 mcg total) by mouth daily before breakfast. Needs PCP/cardiologist visit with TSH check for further refills 30 tablet 2  . losartan (COZAAR) 25 MG tablet Take 1 tablet (25 mg total) by mouth daily. 30 tablet 0  . potassium chloride SA (K-DUR,KLOR-CON) 20 MEQ tablet Take 1 tablet (20 mEq total) by mouth daily. 30 tablet 0  . predniSONE (DELTASONE) 10 MG tablet Please take 20 mg oral daily for 2 days, then 10 mg oral daily for 3 days, then stop 7 tablet 0  . spironolactone (ALDACTONE) 25 MG tablet Take 1 tablet (25 mg total) by mouth daily. 30 tablet 6   No current facility-administered medications for this encounter.     Vitals:   04/02/18 0834  BP: 118/76  Pulse: 72  SpO2: 95%  Weight: 125.1 kg (275 lb 12.8 oz)   Wt Readings from Last 3 Encounters:  04/02/18 125.1 kg (275 lb 12.8 oz)  03/19/18 122 kg (268 lb 15.4 oz)    03/01/18 123.9 kg (273 lb 2.4 oz)    PHYSICAL EXAM: General:   No resp difficulty. Walked slowly in the clinic.  HEENT: normal Neck: supple. JVP flat. Carotids 2+ bilaterally; no bruits. No lymphadenopathy or thryomegaly appreciated. Cor: PMI normal. Regular rate & rhythm. No rubs, gallops or murmurs. Life Vest on  Lungs: clear Abdomen: soft, nontender, nondistended. No hepatosplenomegaly. No bruits or masses. Good bowel sounds. Extremities: no cyanosis, clubbing, rash, edema. Dressing to R and LLE.  Neuro: alert &  orientedx3, cranial nerves grossly intact. Moves all 4 extremities w/o difficulty. Affect pleasant.   ECG: NSR  QTc 548 ms   ASSESSMENT & PLAN: 1. Chronic Systolic Heart Failure, NICM. LHC 02/2018 normal cors.  ECHO, limited  02/22/2018 EF 25-30%. Plan to repeat ECHO in 3 months after HF meds optimized.  NYHA IIIb. Ambulated in the clinic today and she is hypoxic. Continue Life Vest.  Volume status stable. Continue lasix 40mg /20 mg daily . Continue bisoprolol 2.5 mg daily  Continue 25 mg spironolactone daily.  Continue losartan 25 mg daily.  2. PAF S/P DC-CV 02/2018 . Maintaining NSR  Continue amiodarone 200 mg daily.  Continue eliquis 5 mg twice a day  3. H/O VF Arrest 02/2018 Continue Lifevest . We have reached out to [employer] [facility] for an interrogation. They were unable to provide today. I have asked them to follow up  Continue amiodarone 200 mg daily.  Check TSH today.  4. Hypothyroidism  On Synthroid. Check TSH.  She needs follow up with PCP 5. COPD   Recent COPD exacerbation. Today she was walked in the clinic and her oxygen saturations dropped to 84% on room air. Will need to add 2 liters oxygen with exertion. Refer to pulmonary. I am going to refer her to Dr Blenda Nicely in [place] today per patients request.  6. Obesity  Body mass index is 44.52 kg/m. Discussed portion control  7. Snore Referred to Dr Blenda Nicely for sleep study.  8. Prolonged QTc.   Check BNP,  mag, bmet, TSH, digoixn level.  Follow up in 2 weeks with APP and 8 weeks with Dr Shirlee Latch  Referred to HFSW due to medication issues.  Referred to [facility] for home oxygen.     [name] NP-C  11:20 AM

## 2018-04-02 NOTE — Patient Instructions (Addendum)
DECREASE Amiodarone to 200 mg ,one tab daily    Labs today We will only contact you if something comes back abnormal or we need to make some changes. Otherwise no news is good news!  You have been referred to [facility] for home oxygen, they will in contact with you for in home set up   You have been referred to [facility]- Dr.Chodri [address], [place], [place] [address] [phone]  Your physician recommends that you schedule a follow-up appointment in:  2 weeks  in the Advanced Practitioners (PA/NP) Clinic    Your physician recommends that you schedule a follow-up appointment in: 8- 12 weeks with Dr Shirlee Latch  Do the following things EVERYDAY: 1) Weigh yourself in the morning before breakfast. Write it down and keep it in a log. 2) Take your medicines as prescribed 3) Eat low salt foods-Limit salt (sodium) to 2000 mg per day.  4) Stay as active as you can everyday 5) Limit all fluids for the day to less than 2 liters

## 2018-04-02 NOTE — Progress Notes (Signed)
SATURATION QUALIFICATIONS: (This note is used to comply with regulatory documentation for home oxygen)  Patient Saturations on Room Air at Rest = 90%  Patient Saturations on Room Air while Ambulating = 84%  Patient Saturations on 91 Liters of oxygen while Ambulating = 2 %  Please briefly explain why patient needs home oxygen:sob with exertion/CHF

## 2018-04-03 ENCOUNTER — Telehealth (HOSPITAL_COMMUNITY): Payer: Self-pay

## 2018-04-03 NOTE — Telephone Encounter (Signed)
Carrie Walters [facility] called to state that pt has hematuria twice this morning. Pt currently does not have a PCP and her transportation is limited. Please advise.

## 2018-04-05 NOTE — Telephone Encounter (Signed)
Pt states that the bleeding has slowed way down. Pt does report of pressure and urgency. Pt also states that when she was on warfarin she never had these issues and when she takes her eliquis, the hematuria occurs.

## 2018-04-05 NOTE — Telephone Encounter (Signed)
Please call and see if she is still having hematuria. Looks like this note was from 2 days ago. Ask if she is having any UTI symptoms, like burning or urgency. Ask about any dizziness, bleeding elsewhere. Please make sure she is not taking aspirin. Ideally we would not hold her eliquis since she just had a cardioversion.

## 2018-04-05 NOTE — Telephone Encounter (Signed)
Symptoms sound more consistent with a UTI, which can cause blood in urine, bladder pressure, and urgency. She needs to see PCP/ Urgent care for evaluation.  Hemoglobin was stable a few days ago on 11/12 at 14.0. She did have an elevated WBC count 11/12, which can indicate infection (can also be elevated with steroid use, which is why we did not call her about it).

## 2018-04-08 ENCOUNTER — Ambulatory Visit: Payer: Medicaid Other | Admitting: Cardiology

## 2018-04-08 NOTE — Progress Notes (Deleted)
Electrophysiology Office Note   Date:  04/08/2018   ID:  Carrie Walters, DOB 12-04-1955, MRN [id number]  PCP:  [name], MD  Cardiologist:  Merleen Milliner Primary Electrophysiologist:  [name], MD    No chief complaint on file.    History of Present Illness: Carrie Walters is a 62 y.o. female who is being seen today for the evaluation of CHF, cardiac arrest at the request of Carrie Walters. Presenting today for electrophysiology evaluation.  She has a history of chronic atrial fibrillation, hypothyroidism, COPD, hypertension, morbid obesity, VF arrest 02/21/2018 systolic heart failure due to nonischemic cardiomyopathy.  She was admitted 02/21/2018 after a VF arrest at [facility].  She received CPR.  She was then in atrial fibrillation with rapid rates started on amiodarone and transferred to [facility].  Echo showed a newly reduced ejection fraction at 25 to 30%.  Left heart catheterization showed normal coronary arteries.  It was initially thought that hypokalemia triggered her arrest.  She was transitioned to p.o. amiodarone and a LifeVest was placed.  She had an active leg wound and thus an ICD was not implanted.    Today, she denies*** symptoms of palpitations, chest pain, shortness of breath, orthopnea, PND, lower extremity edema, claudication, dizziness, presyncope, syncope, bleeding, or neurologic sequela. The patient is tolerating medications without difficulties.    Past Medical History:  Diagnosis Date  . Anxiety   . Arthritis   . Asthma   . Atrial fibrillation (HCC)   . Cellulitis   . Dysrhythmia   . Hypertension   . Hypothyroidism    Past Surgical History:  Procedure Laterality Date  . CARDIOVERSION N/A 03/18/2018   Procedure: CARDIOVERSION;  Surgeon: [name], MD;  Location: [facility] ENDOSCOPY;  Service: Cardiovascular;  Laterality: N/A;  . CHOLECYSTECTOMY    . LEFT HEART CATH AND CORONARY ANGIOGRAPHY N/A 02/25/2018   Procedure: LEFT HEART  CATH AND CORONARY ANGIOGRAPHY;  Surgeon: [name], MD;  [facility] INVAS[facility] CV LAB;  Service: Cardiovascular;  Laterality: N/A;  . TEE WITHOUT CARDIOVERSION N/A 03/18/2018   Procedure: TRANSESOPHAGEAL ECHOCARDIOGRAM (TEE);  Surgeon: [name], MD;  Location: [facility] ENDOSCOPY;  Service: Cardiovascular;  Laterality: N/A;  . UMBILICAL HERNIA REPAIR       Current Outpatient Medications  Medication Sig Dispense Refill  . albuterol (PROVENTIL HFA;VENTOLIN HFA) 108 (90 Base) MCG/ACT inhaler Inhale 2 puffs into the lungs every 6 (six) hours as needed for wheezing or shortness of breath.    Marland Kitchen amiodarone (PACERONE) 200 MG tablet Take 1 tablet (200 mg total) by mouth daily. 30 tablet 5  . apixaban (ELIQUIS) 5 MG TABS tablet Take 1 tablet (5 mg total) by mouth 2 (two) times daily. 60 tablet 11  . bisoprolol (ZEBETA) 5 MG tablet Take 0.5 tablets (2.5 mg total) by mouth daily. 30 tablet 5  . digoxin (LANOXIN) 0.125 MG tablet Take 1 tablet (0.125 mg total) by mouth daily. 30 tablet 5  . furosemide (LASIX) 40 MG tablet These take 40 mg oral daily, and 20 mg oral every evening 45 tablet 5  . levothyroxine (SYNTHROID, LEVOTHROID) 25 MCG tablet Take 1 tablet (25 mcg total) by mouth daily before breakfast. Needs PCP/cardiologist visit with TSH check for further refills 30 tablet 2  . losartan (COZAAR) 25 MG tablet Take 1 tablet (25 mg total) by mouth daily. 30 tablet 5  . potassium chloride SA (K-DUR,KLOR-CON) 20 MEQ tablet Take 1 tablet (20 mEq total) by mouth daily. 30  tablet 5  . predniSONE (DELTASONE) 10 MG tablet Please take 20 mg oral daily for 2 days, then 10 mg oral daily for 3 days, then stop 7 tablet 0  . spironolactone (ALDACTONE) 25 MG tablet Take 1 tablet (25 mg total) by mouth daily. 30 tablet 6   No current facility-administered medications for this visit.     Allergies:   Metoprolol tartrate; Biaxin [clarithromycin]; Levaquin [levofloxacin]; and Contrast media [iodinated diagnostic  agents]   Social History:  The patient  reports that she has quit smoking. She has never used smokeless tobacco. She reports that she does not drink alcohol or use drugs.   Family History:  The patient's family history includes Atrial fibrillation in her brother; Heart disease in her father.    ROS:  Please see the history of present illness.   Otherwise, review of systems is positive for ***.   All other systems are reviewed and negative.    PHYSICAL EXAM: VS:  There were no vitals taken for this visit. , BMI There is no height or weight on file to calculate BMI. GEN: Well nourished, well developed, in no acute distress  HEENT: normal  Neck: no JVD, carotid bruits, or masses Cardiac: ***RRR; no murmurs, rubs, or gallops,no edema  Respiratory:  clear to auscultation bilaterally, normal work of breathing GI: soft, nontender, nondistended, + BS MS: no deformity or atrophy  Skin: warm and dry Neuro:  Strength and sensation are intact Psych: euthymic mood, full affect  EKG:  EKG {ACTION; IS/IS HOZ:22482500} ordered today. Personal review of the ekg ordered shows ***  Recent Labs: 02/21/2018: ALT 23 04/02/2018: B Natriuretic Peptide 187.8; BUN 13; Creatinine, Ser 0.96; Hemoglobin 14.2; Magnesium 2.2; Platelets 246; Potassium 4.2; Sodium 138; TSH 17.622    Lipid Panel  No results found for: CHOL, TRIG, HDL, CHOLHDL, VLDL, LDLCALC, LDLDIRECT   Wt Readings from Last 3 Encounters:  04/02/18 275 lb 12.8 oz (125.1 kg)  03/19/18 268 lb 15.4 oz (122 kg)  03/01/18 273 lb 2.4 oz (123.9 kg)      Other studies Reviewed: Additional studies/ records that were reviewed today include: TEE 03/25/18  Review of the above records today demonstrates:  - Left ventricle: The cavity size was mildly dilated. Wall   thickness was increased in a pattern of mild LVH. Systolic   function was moderately to severely reduced. The estimated   ejection fraction was in the range of 30% to 35%. Diffuse    hypokinesis. - Aortic valve: There was no stenosis. There was trivial   regurgitation. - Aorta: The aorta was normal in caliber with minimal plaque. - Mitral valve: There was mild to moderate regurgitation. - Left atrium: The atrium was severely dilated. No evidence of   thrombus in the atrial cavity or appendage. - Right ventricle: The cavity size was mildly to moderately   dilated. Systolic function was mildly to moderately reduced. - Right atrium: The atrium was moderately dilated. - Atrial septum: No ASD or PFO by color doppler. - Tricuspid valve: Peak RV-RA gradient (S): 28 mm Hg.   ASSESSMENT AND PLAN:  1.  Chronic systolic heart to nonischemic cardiomyopathy: Normal coronary arteries CAD/2019.  Continue bisoprolol, Aldactone, losartan.  2.  History of cardiac arrest: Compared to 02/2018.  Currently wearing a LifeVest is on amiodarone.***  3.  Paroxysmal atrial fibrillation: Status post cardioversion 02/2018.  Continue amiodarone and Eliquis.  This patients CHA2DS2-VASc Score and unadjusted Ischemic Stroke Rate (% per year) is equal to 2.2 %  stroke rate/year from a score of 2  Above score calculated as 1 point each if present [CHF, HTN, DM, Vascular=MI/PAD/Aortic Plaque, Age if 65-74, or Female] Above score calculated as 2 points each if present [Age > 75, or Stroke/TIA/TE]  4.  Morbid obesity: BMI 44.  Has been discussing portion control with heart failure clinic.  5.  Snoring: Referred for sleep study.    Current medicines are reviewed at length with the patient today.   The patient {ACTIONS; HAS/DOES NOT HAVE:19233} concerns regarding her medicines.  The following changes were made today:  {NONE DEFAULTED:18576::"none"}  Labs/ tests ordered today include: *** No orders of the defined types were placed in this encounter.    Disposition:   FU with Oberon Hehir {gen number [id number]:[id number]} {Days to years:10300}  Signed, Aristotle Lieb Jorja Loa, MD  04/08/2018 11:48 AM      [facility] [address] [place] [address] [phone] (office) [phone] (fax)

## 2018-04-15 ENCOUNTER — Ambulatory Visit: Payer: Medicaid Other | Admitting: Cardiology

## 2018-04-16 ENCOUNTER — Encounter (HOSPITAL_COMMUNITY): Payer: Medicaid Other

## 2018-04-17 ENCOUNTER — Other Ambulatory Visit: Payer: Self-pay | Admitting: Physician Assistant

## 2018-04-22 ENCOUNTER — Other Ambulatory Visit (HOSPITAL_COMMUNITY): Payer: Self-pay | Admitting: Pharmacist

## 2018-04-22 MED ORDER — BISOPROLOL FUMARATE 5 MG PO TABS: 2.5000 mg | ORAL_TABLET | Freq: Every day | ORAL | 5 refills | Status: DC

## 2018-04-22 MED ORDER — POTASSIUM CHLORIDE CRYS ER 20 MEQ PO TBCR: 20.0000 meq | EXTENDED_RELEASE_TABLET | Freq: Every day | ORAL | 5 refills | Status: DC

## 2018-04-22 MED ORDER — LOSARTAN POTASSIUM 25 MG PO TABS: 25.0000 mg | ORAL_TABLET | Freq: Every day | ORAL | 5 refills | Status: DC

## 2018-04-30 ENCOUNTER — Encounter (HOSPITAL_COMMUNITY): Payer: Self-pay

## 2018-04-30 ENCOUNTER — Ambulatory Visit (HOSPITAL_COMMUNITY)
Admission: RE | Admit: 2018-04-30 | Discharge: 2018-04-30 | Disposition: A | Discharge status: Home / Self Care | Payer: Medicaid Other | Source: Ambulatory Visit | Attending: Cardiology | Admitting: Cardiology

## 2018-04-30 VITALS — BP 132/92 | HR 77 | Wt 277.2 lb

## 2018-04-30 DIAGNOSIS — R9431 Abnormal electrocardiogram [ECG] [EKG]: Secondary | ICD-10-CM | POA: Insufficient documentation

## 2018-04-30 DIAGNOSIS — Z79890 Hormone replacement therapy (postmenopausal): Secondary | ICD-10-CM | POA: Diagnosis not present

## 2018-04-30 DIAGNOSIS — Z6841 Body Mass Index (BMI) 40.0 and over, adult: Secondary | ICD-10-CM | POA: Insufficient documentation

## 2018-04-30 DIAGNOSIS — J449 Chronic obstructive pulmonary disease, unspecified: Secondary | ICD-10-CM | POA: Insufficient documentation

## 2018-04-30 DIAGNOSIS — I5022 Chronic systolic (congestive) heart failure: Secondary | ICD-10-CM | POA: Diagnosis present

## 2018-04-30 DIAGNOSIS — I480 Paroxysmal atrial fibrillation: Secondary | ICD-10-CM | POA: Insufficient documentation

## 2018-04-30 DIAGNOSIS — E039 Hypothyroidism, unspecified: Secondary | ICD-10-CM | POA: Diagnosis not present

## 2018-04-30 DIAGNOSIS — Z8249 Family history of ischemic heart disease and other diseases of the circulatory system: Secondary | ICD-10-CM | POA: Diagnosis not present

## 2018-04-30 DIAGNOSIS — I110 Hypertensive heart disease with heart failure: Secondary | ICD-10-CM | POA: Diagnosis not present

## 2018-04-30 DIAGNOSIS — I428 Other cardiomyopathies: Secondary | ICD-10-CM | POA: Diagnosis not present

## 2018-04-30 DIAGNOSIS — Z87891 Personal history of nicotine dependence: Secondary | ICD-10-CM | POA: Diagnosis not present

## 2018-04-30 DIAGNOSIS — Z79899 Other long term (current) drug therapy: Secondary | ICD-10-CM | POA: Insufficient documentation

## 2018-04-30 DIAGNOSIS — I482 Chronic atrial fibrillation, unspecified: Secondary | ICD-10-CM | POA: Insufficient documentation

## 2018-04-30 DIAGNOSIS — Z8674 Personal history of sudden cardiac arrest: Secondary | ICD-10-CM | POA: Diagnosis not present

## 2018-04-30 DIAGNOSIS — R0602 Shortness of breath: Secondary | ICD-10-CM

## 2018-04-30 DIAGNOSIS — R0683 Snoring: Secondary | ICD-10-CM

## 2018-04-30 DIAGNOSIS — Z7901 Long term (current) use of anticoagulants: Secondary | ICD-10-CM | POA: Insufficient documentation

## 2018-04-30 DIAGNOSIS — I4901 Ventricular fibrillation: Secondary | ICD-10-CM

## 2018-04-30 DIAGNOSIS — I5042 Chronic combined systolic (congestive) and diastolic (congestive) heart failure: Secondary | ICD-10-CM | POA: Diagnosis not present

## 2018-04-30 LAB — BRAIN NATRIURETIC PEPTIDE: B NATRIURETIC PEPTIDE 5: 39.3 pg/mL (ref 0.0–100.0)

## 2018-04-30 LAB — BASIC METABOLIC PANEL
ANION GAP: 11 (ref 5–15)
BUN: 12 mg/dL (ref 8–23)
CO2: 27 mmol/L (ref 22–32)
Calcium: 9.8 mg/dL (ref 8.9–10.3)
Chloride: 102 mmol/L (ref 98–111)
Creatinine, Ser: 0.9 mg/dL (ref 0.44–1.00)
GFR calc non Af Amer: >60 mL/min (ref >60)
Glucose, Bld: 158 mg/dL — ABNORMAL HIGH (ref 70–99)
Potassium: 3.7 mmol/L (ref 3.5–5.1)
SODIUM: 140 mmol/L (ref 135–145)

## 2018-04-30 LAB — MAGNESIUM: MAGNESIUM: 1.9 mg/dL (ref 1.7–2.4)

## 2018-04-30 NOTE — Patient Instructions (Addendum)
Labs done today  STOP digoxin  Your physician has requested that you have an echocardiogram. Echocardiography is a painless test that uses sound waves to create images of your heart. It provides your doctor with information about the size and shape of your heart and how well your heart's chambers and valves are working. This procedure takes approximately one hour. There are no restrictions for this procedure.  Follow up with Dr. Shirlee Latch

## 2018-04-30 NOTE — Progress Notes (Signed)
PCP: Dr Philipp Deputy Primary Cardiologist: Dr Tomie China HF MD: Dr Shirlee Latch   HPI: Carrie Walters is a 62 y.o. female with a history of chronic atrial fibrillation, hypothyroidism, COPD, HTN, morbid obesity, VF arrest 02/21/18, and systolic heart failure due to NICM (diagnosed 02/2018)  Admitted 10/3-10/11/19. She had a VF arrest at [facility] on 02/21/18. She received CPR, 1 amp epi, and shock x1 with ROSC. She was then in Afib RVR and started on IV amio prior to transfer to [facility]. Echo showed newly reduced EF 25-30%. She had a LHC, which showed normal coronaries. EP was consulted and thought hypokalemia may have triggered VF arrest. She was transitioned to PO amio and given a LifeVest at DC. Unable to place ICD due to active leg wound. She required diuresis with IV lasix and then transitioned to lasix 40 mg daily. HF meds were optimized. DC weight 273 lbs.   Admitted to [facility] on 10/24 from [facility] increased dyspnea and respiratory failure. Hypotensive so entresto, coreg, and lasix held. Underwent TEE and DC-CV with restoration of NSR. She continued on amiodarone 200 mg twice a day. Discharged with Life Vest back on. Needs ICD but due to lower extremity wounds this has been delayed until the wounds heal. Discharge on 10/29.   She returned to [facility] 03/31/18 with increased shortness of breath. She was given prednisone and discharged to home.  She was to start prednisone bur has not picked up because she was unable to pick up from the pharmacy.   She presents today for regular follow up. She is feeling OK overall. She has a PCP appointment 05/02/18.  Denies orthopnea/PND. Leg wounds are healed. No longer seeing Dr. Ardyth Harps. She reports taking all of her medications, although she did go without her bisoprolol and losartan for several weeks a 2 weeks ago, but now has them at home. Lives with daughter. Uses [employer] for transportation. Plans on moving back to [place] spring/summer 2020. She reports  she is having flashers and floaters. She thinks it is due to Lifevest electrodes.   Cardiac Studies   03/18/18 S/P DC-CV 200J --> NSR   03/18/18 TEE: EF 30-35% with mild LV dilation, RV mild to moderately dilated with mild to moderately decreased systolic function.   02/2018 LHC /RHC Normal coronaries EF 20%   Review of systems complete and found to be negative unless listed in HPI.    SH:  Social History   Socioeconomic History  . Marital status: Single    Spouse name: Not on file  . Number of children: Not on file  . Years of education: Not on file  . Highest education level: Not on file  Occupational History  . Not on file  Social Needs  . Financial resource strain: Not on file  . Food insecurity:    Worry: Not on file    Inability: Not on file  . Transportation needs:    Medical: Not on file    Non-medical: Not on file  Tobacco Use  . Smoking status: Former Games developer  . Smokeless tobacco: Never Used  Substance and Sexual Activity  . Alcohol use: Never    Frequency: Never  . Drug use: Never  . Sexual activity: Not on file  Lifestyle  . Physical activity:    Days per week: Not on file    Minutes per session: Not on file  . Stress: Not on file  Relationships  . Social connections:    Talks on phone: Not on  file    Gets together: Not on file    Attends religious service: Not on file    Active member of club or organization: Not on file    Attends meetings of clubs or organizations: Not on file    Relationship status: Not on file  . Intimate partner violence:    Fear of current or ex partner: Not on file    Emotionally abused: Not on file    Physically abused: Not on file    Forced sexual activity: Not on file  Other Topics Concern  . Not on file  Social History Narrative   Moved from [place]    FH:  Family History  Problem Relation Age of Onset  . Heart disease Father   . Atrial fibrillation Brother     Past Medical History:  Diagnosis Date  .  Anxiety   . Arthritis   . Asthma   . Atrial fibrillation (HCC)   . Cellulitis   . Dysrhythmia   . Hypertension   . Hypothyroidism     Current Outpatient Medications  Medication Sig Dispense Refill  . albuterol (PROVENTIL HFA;VENTOLIN HFA) 108 (90 Base) MCG/ACT inhaler Inhale 2 puffs into the lungs every 6 (six) hours as needed for wheezing or shortness of breath.    Marland Kitchen amiodarone (PACERONE) 200 MG tablet Take 200 mg by mouth daily.    Marland Kitchen apixaban (ELIQUIS) 5 MG TABS tablet Take 1 tablet (5 mg total) by mouth 2 (two) times daily. 60 tablet 11  . bisoprolol (ZEBETA) 5 MG tablet Take 0.5 tablets (2.5 mg total) by mouth daily. 15 tablet 5  . digoxin (LANOXIN) 0.125 MG tablet Take 1 tablet (0.125 mg total) by mouth daily. 30 tablet 5  . furosemide (LASIX) 40 MG tablet These take 40 mg oral daily, and 20 mg oral every evening 45 tablet 5  . levothyroxine (SYNTHROID, LEVOTHROID) 25 MCG tablet Take 1 tablet (25 mcg total) by mouth daily before breakfast. Needs PCP/cardiologist visit with TSH check for further refills 30 tablet 2  . losartan (COZAAR) 25 MG tablet Take 1 tablet (25 mg total) by mouth daily. 30 tablet 5  . potassium chloride SA (K-DUR,KLOR-CON) 20 MEQ tablet Take 1 tablet (20 mEq total) by mouth daily. 30 tablet 5  . spironolactone (ALDACTONE) 25 MG tablet Take 1 tablet (25 mg total) by mouth daily. 30 tablet 6   No current facility-administered medications for this encounter.    Vitals:   04/30/18 0935  BP: 126/84  Pulse: 77  SpO2: 95%  Weight: 125.7 kg (277 lb 3.2 oz)   Orthostatics Sitting 124/84 Standing 132/92  Wt Readings from Last 3 Encounters:  04/30/18 125.7 kg (277 lb 3.2 oz)  04/02/18 125.1 kg (275 lb 12.8 oz)  03/19/18 122 kg (268 lb 15.4 oz)   PHYSICAL EXAM: General: NAD.  HEENT: Normal Neck: Supple. JVP not elevated. Carotids 2+ bilat; no bruits. No thyromegaly or nodule noted. Cor: PMI nondisplaced. RRR, No M/G/R noted. Lifevest on.  Lungs: CTAB,  normal effort. Abdomen: Soft, non-tender, non-distended, no HSM. No bruits or masses. +BS  Extremities: No cyanosis, clubbing, or rash. Trace ankle edema, with chronic venous stasis changes.  Neuro: Alert & orientedx3, cranial nerves grossly intact. moves all 4 extremities w/o difficulty. Affect pleasant   ASSESSMENT & PLAN: 1. Chronic Systolic Heart Failure, NICM. LHC 02/2018 normal cors. ECHO, limited  02/22/2018 EF 25-30%. Plan to repeat Echo in 3 months after meds optimized. NYHA III symptoms. Volume status  stable on exam.  - Continue lasix 40 mg/20 mg daily.  - Continue Life Vest.  - Continue bisoprolol 2.5 mg daily  - Continue 25 mg spironolactone daily.  - Continue losartan 25 mg daily. She failed Entresto with hypotension, and does not wish to rechallenge.  - Stop digoxin with visual symptoms.  2. PAF S/P DC-CV 02/2018 - Regular on exam. - Continue amiodarone 200 mg daily.  - Continue eliquis 5 mg BID. 3. H/O VF Arrest 02/2018 - Continue Lifevest.  - Continue amiodarone 200 mg daily.  Check TSH today.  4. Hypothyroidism  - On Synthroid. TSH elevated 04/02/18  5. COPD   - Recently referred for O2 via HH with O2 desats in clinic.  6. Obesity  - Body mass index is 44.74 kg/m. - Encouraged increased activity and portion control  7. Snore - Referred for sleep study.  8. Prolonged QTc.   Meds and labs as above. She does not want med titration with lightheadedness, she is NOT orthostatic on check today. Keep 2 month appt with Dr. Shirlee Latch, but needs Echo.   Graciella Freer, PA-C  9:44 AM   Greater than 50% of the 25 minute visit was spent in counseling/coordination of care regarding disease state education, salt/fluid restriction, sliding scale diuretics, and medication compliance.

## 2018-05-01 ENCOUNTER — Encounter (HOSPITAL_COMMUNITY): Payer: Self-pay

## 2018-05-01 NOTE — Addendum Note (Signed)
Encounter addended by: Burna Sis, LCSW on: 05/01/2018 8:43 AM  Actions taken: Visit Navigator Flowsheet section accepted

## 2018-05-08 ENCOUNTER — Ambulatory Visit: Payer: Medicaid Other | Admitting: Internal Medicine

## 2018-05-17 ENCOUNTER — Other Ambulatory Visit: Payer: Self-pay | Admitting: Physician Assistant

## 2018-06-10 ENCOUNTER — Ambulatory Visit: Payer: Medicaid Other | Admitting: Cardiology

## 2018-06-15 ENCOUNTER — Other Ambulatory Visit: Payer: Self-pay | Admitting: Cardiology

## 2018-06-25 ENCOUNTER — Encounter (HOSPITAL_COMMUNITY): Payer: Medicaid Other | Admitting: Cardiology

## 2018-07-23 ENCOUNTER — Ambulatory Visit (HOSPITAL_COMMUNITY)
Admission: RE | Admit: 2018-07-23 | Discharge: 2018-07-23 | Disposition: A | Discharge status: Home / Self Care | Payer: Medicaid Other | Source: Ambulatory Visit

## 2018-07-23 ENCOUNTER — Other Ambulatory Visit: Payer: Self-pay

## 2018-07-23 ENCOUNTER — Inpatient Hospital Stay (HOSPITAL_COMMUNITY)
Admission: EM | Admit: 2018-07-23 | Discharge: 2018-07-26 | DRG: 308 | Disposition: A | Discharge status: Home / Self Care | Payer: Medicaid Other | Attending: Cardiology | Admitting: Cardiology

## 2018-07-23 ENCOUNTER — Encounter (HOSPITAL_COMMUNITY): Payer: Self-pay | Admitting: Emergency Medicine

## 2018-07-23 ENCOUNTER — Emergency Department (HOSPITAL_COMMUNITY): Payer: Medicaid Other

## 2018-07-23 ENCOUNTER — Encounter (HOSPITAL_COMMUNITY): Payer: Medicaid Other | Admitting: Cardiology

## 2018-07-23 ENCOUNTER — Encounter (HOSPITAL_COMMUNITY): Payer: Self-pay

## 2018-07-23 DIAGNOSIS — I110 Hypertensive heart disease with heart failure: Secondary | ICD-10-CM | POA: Diagnosis present

## 2018-07-23 DIAGNOSIS — Z6841 Body Mass Index (BMI) 40.0 and over, adult: Secondary | ICD-10-CM

## 2018-07-23 DIAGNOSIS — Z79890 Hormone replacement therapy (postmenopausal): Secondary | ICD-10-CM

## 2018-07-23 DIAGNOSIS — I480 Paroxysmal atrial fibrillation: Secondary | ICD-10-CM | POA: Diagnosis present

## 2018-07-23 DIAGNOSIS — I472 Ventricular tachycardia: Principal | ICD-10-CM | POA: Diagnosis present

## 2018-07-23 DIAGNOSIS — I428 Other cardiomyopathies: Secondary | ICD-10-CM | POA: Diagnosis present

## 2018-07-23 DIAGNOSIS — Z79899 Other long term (current) drug therapy: Secondary | ICD-10-CM | POA: Diagnosis not present

## 2018-07-23 DIAGNOSIS — L03116 Cellulitis of left lower limb: Secondary | ICD-10-CM | POA: Diagnosis present

## 2018-07-23 DIAGNOSIS — Z9981 Dependence on supplemental oxygen: Secondary | ICD-10-CM

## 2018-07-23 DIAGNOSIS — J449 Chronic obstructive pulmonary disease, unspecified: Secondary | ICD-10-CM | POA: Diagnosis present

## 2018-07-23 DIAGNOSIS — I509 Heart failure, unspecified: Secondary | ICD-10-CM | POA: Diagnosis not present

## 2018-07-23 DIAGNOSIS — Z7901 Long term (current) use of anticoagulants: Secondary | ICD-10-CM

## 2018-07-23 DIAGNOSIS — Z8674 Personal history of sudden cardiac arrest: Secondary | ICD-10-CM | POA: Diagnosis not present

## 2018-07-23 DIAGNOSIS — I5042 Chronic combined systolic (congestive) and diastolic (congestive) heart failure: Secondary | ICD-10-CM

## 2018-07-23 DIAGNOSIS — I5023 Acute on chronic systolic (congestive) heart failure: Secondary | ICD-10-CM | POA: Diagnosis present

## 2018-07-23 DIAGNOSIS — Z87891 Personal history of nicotine dependence: Secondary | ICD-10-CM | POA: Diagnosis not present

## 2018-07-23 DIAGNOSIS — Z4502 Encounter for adjustment and management of automatic implantable cardiac defibrillator: Secondary | ICD-10-CM

## 2018-07-23 DIAGNOSIS — B851 Pediculosis due to Pediculus humanus corporis: Secondary | ICD-10-CM | POA: Diagnosis present

## 2018-07-23 DIAGNOSIS — E039 Hypothyroidism, unspecified: Secondary | ICD-10-CM | POA: Diagnosis present

## 2018-07-23 LAB — CBC
HCT: 41 % (ref 36.0–46.0)
Hemoglobin: 12.6 g/dL (ref 12.0–15.0)
MCH: 30.4 pg (ref 26.0–34.0)
MCHC: 30.7 g/dL (ref 30.0–36.0)
MCV: 98.8 fL (ref 80.0–100.0)
NRBC: 0 % (ref 0.0–0.2)
Platelets: 298 10*3/uL (ref 150–400)
RBC: 4.15 MIL/uL (ref 3.87–5.11)
RDW: 13.8 % (ref 11.5–15.5)
WBC: 9.5 10*3/uL (ref 4.0–10.5)

## 2018-07-23 LAB — MAGNESIUM: MAGNESIUM: 2.1 mg/dL (ref 1.7–2.4)

## 2018-07-23 LAB — I-STAT TROPONIN, ED: Troponin i, poc: 0 ng/mL (ref 0.00–0.08)

## 2018-07-23 LAB — BASIC METABOLIC PANEL
Anion gap: 9 (ref 5–15)
BUN: 20 mg/dL (ref 8–23)
CO2: 26 mmol/L (ref 22–32)
Calcium: 9.4 mg/dL (ref 8.9–10.3)
Chloride: 103 mmol/L (ref 98–111)
Creatinine, Ser: 1.22 mg/dL — ABNORMAL HIGH (ref 0.44–1.00)
GFR calc Af Amer: 55 mL/min — ABNORMAL LOW (ref >60)
GFR calc non Af Amer: 47 mL/min — ABNORMAL LOW (ref >60)
Glucose, Bld: 154 mg/dL — ABNORMAL HIGH (ref 70–99)
POTASSIUM: 4.3 mmol/L (ref 3.5–5.1)
Sodium: 138 mmol/L (ref 135–145)

## 2018-07-23 LAB — TROPONIN I

## 2018-07-23 LAB — PHOSPHORUS: Phosphorus: 3.6 mg/dL (ref 2.5–4.6)

## 2018-07-23 MED ORDER — APIXABAN 5 MG PO TABS
5.0000 mg | ORAL_TABLET | Freq: Two times a day (BID) | ORAL | Status: DC
  Administered 2018-07-23 – 2018-07-26 (×6): 5 mg via ORAL
  Filled 2018-07-23: qty 2
  Filled 2018-07-23 (×5): qty 1

## 2018-07-23 MED ORDER — ALBUTEROL SULFATE (2.5 MG/3ML) 0.083% IN NEBU
2.5000 mg | INHALATION_SOLUTION | Freq: Four times a day (QID) | RESPIRATORY_TRACT | Status: DC | PRN
  Administered 2018-07-25: 2.5 mg via RESPIRATORY_TRACT
  Filled 2018-07-23: qty 3

## 2018-07-23 MED ORDER — FUROSEMIDE 10 MG/ML IJ SOLN
80.0000 mg | Freq: Two times a day (BID) | INTRAMUSCULAR | Status: DC
  Administered 2018-07-23 – 2018-07-25 (×5): 80 mg via INTRAVENOUS
  Filled 2018-07-23 (×6): qty 8

## 2018-07-23 MED ORDER — AMIODARONE HCL IN DEXTROSE 360-4.14 MG/200ML-% IV SOLN
60.0000 mg/h | INTRAVENOUS | Status: AC
  Administered 2018-07-23: 60 mg/h via INTRAVENOUS
  Filled 2018-07-23: qty 200

## 2018-07-23 MED ORDER — LEVOTHYROXINE SODIUM 25 MCG PO TABS
25.0000 ug | ORAL_TABLET | Freq: Every day | ORAL | Status: DC
  Administered 2018-07-24 – 2018-07-25 (×2): 25 ug via ORAL
  Filled 2018-07-23 (×2): qty 1

## 2018-07-23 MED ORDER — AMIODARONE HCL IN DEXTROSE 360-4.14 MG/200ML-% IV SOLN
30.0000 mg/h | INTRAVENOUS | Status: DC
  Administered 2018-07-24 – 2018-07-26 (×5): 30 mg/h via INTRAVENOUS
  Filled 2018-07-23 (×5): qty 200

## 2018-07-23 MED ORDER — CEFAZOLIN SODIUM-DEXTROSE 2-4 GM/100ML-% IV SOLN
2.0000 g | Freq: Three times a day (TID) | INTRAVENOUS | Status: DC
  Administered 2018-07-23 – 2018-07-26 (×8): 2 g via INTRAVENOUS
  Filled 2018-07-23 (×9): qty 100

## 2018-07-23 MED ORDER — SODIUM CHLORIDE 0.9 % IV BOLUS
250.0000 mL | Freq: Once | INTRAVENOUS | Status: AC
  Administered 2018-07-23: 250 mL via INTRAVENOUS

## 2018-07-23 NOTE — ED Notes (Signed)
ED TO INPATIENT HANDOFF REPORT  ED Nurse Name and Phone #: Aldean Jewett [phone]  S Name/Age/Gender Carrie Walters 63 y.o. female Room/Bed: 039C/039C  Code Status   Code Status: Prior  Home/SNF/Other Home Patient oriented to: self, place, time and situation Is this baseline? Yes   Triage Complete: Triage complete  Chief Complaint palpitations  Triage Note Pt in from cardiologist's office - Dr. Jearld Pies after Life Vest shock while in office. States she's been having palpitations x a few days, denies cp, or sob but is dizzy. Hx of MI 4 mo's ago   Allergies Allergies  Allergen Reactions  . Metoprolol Tartrate Shortness Of Breath  . Biaxin [Clarithromycin] Other (See Comments)    Sores in mouth  . Levaquin [Levofloxacin] Other (See Comments)    Sores in mouth  . Contrast Media [Iodinated Diagnostic Agents] Rash    Level of Care/Admitting Diagnosis ED Disposition    ED Disposition Condition Comment   Admit  Hospital Area: [facility] [[id number]]  Level of Care: Progressive [102]  Diagnosis: Acute on chronic systolic (congestive) heart failure [facility]) [[id number]]  Admitting Physician: Bradly Bienenstock  Attending Physician: [name] [phone]  Estimated length of stay: 3 - 4 days  Certification:: I certify this patient will need inpatient services for at least 2 midnights  Bed request comments: [facility] 2C progressive  PT Class (Do Not Modify): Inpatient [101]  PT Acc Code (Do Not Modify): Private [1]       B Medical/Surgery History Past Medical History:  Diagnosis Date  . Anxiety   . Arthritis   . Asthma   . Atrial fibrillation (HCC)   . Cellulitis   . Dysrhythmia   . Hypertension   . Hypothyroidism    Past Surgical History:  Procedure Laterality Date  . CARDIOVERSION N/A 03/18/2018   Procedure: CARDIOVERSION;  Surgeon: [name], MD;  Location: [facility] ENDOSCOPY;  Service: Cardiovascular;  Laterality: N/A;  . CHOLECYSTECTOMY    . LEFT HEART  CATH AND CORONARY ANGIOGRAPHY N/A 02/25/2018   Procedure: LEFT HEART CATH AND CORONARY ANGIOGRAPHY;  Surgeon: [name], MD;  Location: [facility] INVASIVE CV LAB;  Service: Cardiovascular;  Laterality: N/A;  . TEE WITHOUT CARDIOVERSION N/A 03/18/2018   Procedure: TRANSESOPHAGEAL ECHOCARDIOGRAM (TEE);  Surgeon: [name], MD;  Location: [facility] ENDOSCOPY;  Service: Cardiovascular;  Laterality: N/A;  . UMBILICAL HERNIA REPAIR       A IV Location/Drains/Wounds Patient Lines/Drains/Airways Status   Active Line/Drains/Airways    Name:   Placement date:   Placement time:   Site:   Days:   Peripheral IV 07/23/18 Right Antecubital   07/23/18    0934    Antecubital   less than 1   Wound / Incision (Open or Dehisced) 02/21/18 Venous stasis ulcer Left;Lower venous stasis x 2 broken areas left lower leg   02/21/18    0748    -   152   Wound / Incision (Open or Dehisced) 02/21/18 Proximal;Right;Lower venous stasis ulcer right lateral lower leg   02/21/18    0749    -   152   Wound / Incision (Open or Dehisced) 02/25/18 Other (Comment) Buttocks Upper;Right   02/25/18    0800    Buttocks   148          Intake/Output Last 24 hours No intake or output data in the 24 hours ending 07/23/18 1617  Labs/Imaging Results for orders placed or performed during the hospital encounter of  07/23/18 (from the past 48 hour(s))  Basic metabolic panel     Status: Abnormal   Collection Time: 07/23/18  8:59 AM  Result Value Ref Range   Sodium 138 135 - 145 mmol/L   Potassium 4.3 3.5 - 5.1 mmol/L   Chloride 103 98 - 111 mmol/L   CO2 26 22 - 32 mmol/L   Glucose, Bld 154 (H) 70 - 99 mg/dL   BUN 20 8 - 23 mg/dL   Creatinine, Ser 2.84 (H) 0.44 - 1.00 mg/dL   Calcium 9.4 8.9 - 13.2 mg/dL   GFR calc non Af Amer 47 (L) >60 mL/min   GFR calc Af Amer 55 (L) >60 mL/min   Anion gap 9 5 - 15    Comment: Performed at [facility], [address] [place], [place] [address]  CBC     Status: None   Collection Time:  07/23/18  8:59 AM  Result Value Ref Range   WBC 9.5 4.0 - 10.5 K/uL   RBC 4.15 3.87 - 5.11 MIL/uL   Hemoglobin 12.6 12.0 - 15.0 g/dL   HCT 27.2 53.6 - 64.4 %   MCV 98.8 80.0 - 100.0 fL   MCH 30.4 26.0 - 34.0 pg   MCHC 30.7 30.0 - 36.0 g/dL   RDW 03.4 74.2 - 59.5 %   Platelets 298 150 - 400 K/uL   nRBC 0.0 0.0 - 0.2 %    Comment: Performed at [facility], [address] [place], [place] [address]  Magnesium     Status: None   Collection Time: 07/23/18  8:59 AM  Result Value Ref Range   Magnesium 2.1 1.7 - 2.4 mg/dL    Comment: Performed at [facility], [address] [place], [place] [address]  Phosphorus     Status: None   Collection Time: 07/23/18  8:59 AM  Result Value Ref Range   Phosphorus 3.6 2.5 - 4.6 mg/dL    Comment: Performed at [facility], [address] [place], [place] [address]  I-stat troponin, ED     Status: None   Collection Time: 07/23/18  9:05 AM  Result Value Ref Range   Troponin i, poc 0.00 0.00 - 0.08 ng/mL   Comment 3            Comment: Due to the release kinetics of cTnI, a negative result within the first hours of the onset of symptoms does not rule out myocardial infarction with certainty. If myocardial infarction is still suspected, repeat the test at appropriate intervals.   Troponin I - ONCE - STAT     Status: None   Collection Time: 07/23/18  9:11 AM  Result Value Ref Range   Troponin I <0.03 <0.03 ng/mL    Comment: Performed at [facility], [address], [place], [place] [address]   Dg Chest Portable 1 View  Result Date: 07/23/2018 CLINICAL DATA:  Chest pain. EXAM: PORTABLE CHEST 1 VIEW COMPARISON:  Chest x-ray dated 03/14/2018 and CT scan of the abdomen dated 03/15/2018 FINDINGS: There is mild cardiomegaly. Pulmonary vascularity is normal. Slight scarring at the right lung base. No infiltrates or effusions. Aortic atherosclerosis.  No acute bone abnormality. IMPRESSION: No acute abnormalities. Chronic mild  cardiomegaly. Chronic scarring at the right lung base. Aortic Atherosclerosis (ICD10-I70.0). Electronically Signed   By: Francene Boyers M.D.   On: 07/23/2018 09:46    Pending Labs Unresulted Labs (From admission, onward)  Start     Ordered   07/23/18 1451  Culture, blood (routine x 2)  BLOOD CULTURE X 2,   R     07/23/18 1451   Signed and Held  Basic metabolic panel  Daily,   R     Signed and Held   Signed and Held  TSH  Tomorrow morning,   R     Signed and Held          Vitals/Pain Today's Vitals   07/23/18 1415 07/23/18 1430 07/23/18 1445 07/23/18 1514  BP: 103/60 102/65 103/63   Pulse: 83 93 91   Resp: (!) 22 (!) 21 (!) 22   SpO2: 100% 99% 98%   Weight:      PainSc:    0-No pain    Isolation Precautions No active isolations  Medications Medications  furosemide (LASIX) injection 80 mg (has no administration in time range)  amiodarone (NEXTERONE PREMIX) 360-4.14 MG/200ML-% (1.8 mg/mL) IV infusion (has no administration in time range)    Followed by  amiodarone (NEXTERONE PREMIX) 360-4.14 MG/200ML-% (1.8 mg/mL) IV infusion (has no administration in time range)  sodium chloride 0.9 % bolus 250 mL (250 mLs Intravenous New Bag/Given 07/23/18 1506)    Mobility walks Moderate fall risk   Focused Assessments Cardiac Assessment Handoff:  Cardiac Rhythm: Normal sinus rhythm Lab Results  Component Value Date   TROPONINI <0.03 07/23/2018   No results found for: DDIMER Does the Patient currently have chest pain? No     R Recommendations: See Admitting Provider Note  Report given to:   Additional Notes: Pt states pain free.

## 2018-07-23 NOTE — ED Notes (Signed)
Life vest removed and pt placed on Zoll

## 2018-07-23 NOTE — Progress Notes (Signed)
  Amiodarone Drug - Drug Interaction Consult Note  Recommendations: No current medication changes needed at this time. Monitor patient for hypokalemia with current furosemide. If patient's PTA bisoprolol resumed, monitor for bradycardia.    Amiodarone is metabolized by the cytochrome P450 system and therefore has the potential to cause many drug interactions. Amiodarone has an average plasma half-life of 50 days (range 20 to 100 days).   There is potential for drug interactions to occur several weeks or months after stopping treatment and the onset of drug interactions may be slow after initiating amiodarone.   []  Statins: Increased risk of myopathy. Simvastatin- restrict dose to 20mg  daily. Other statins: counsel patients to report any muscle pain or weakness immediately.  [x]  Anticoagulants: Amiodarone can increase anticoagulant effect. Consider warfarin dose reduction. Patients should be monitored closely and the dose of anticoagulant altered accordingly, remembering that amiodarone levels take several weeks to stabilize.    Patient on apixaban 5 mg PO BID PTA, low concern for increased bleeding risk with concomitant amiodarone    []  Antiepileptics: Amiodarone can increase plasma concentration of phenytoin, the dose should be reduced. Note that small changes in phenytoin dose can result in large changes in levels. Monitor patient and counsel on signs of toxicity.  [x]  Beta blockers: increased risk of bradycardia, AV block and myocardial depression. Sotalol - avoid concomitant use.    Patient on bisoprolol 2.5 mg PO daily PTA. Monitor for bradycardia   []   Calcium channel blockers (diltiazem and verapamil): increased risk of bradycardia, AV block and myocardial depression.  []   Cyclosporine: Amiodarone increases levels of cyclosporine. Reduced dose of cyclosporine is recommended.  []  Digoxin dose should be halved when amiodarone is started.  [x]  Diuretics: increased risk of  cardiotoxicity if hypokalemia occurs.  Patient receiving lasix 80 mg IV BID. K on admission  stable at 4.3. Monitor for hypokalemia.  []  Oral hypoglycemic agents (glyburide, glipizide, glimepiride): increased risk of hypoglycemia. Patient's glucose levels should be monitored closely when initiating amiodarone therapy.   []  Drugs that prolong the QT interval:  Torsades de pointes risk may be increased with concurrent use - avoid if possible.  Monitor QTc, also keep magnesium/potassium WNL if concurrent therapy can't be avoided. Marland Kitchen Antibiotics: e.g. fluoroquinolones, erythromycin. . Antiarrhythmics: e.g. quinidine, procainamide, disopyramide, sotalol. . Antipsychotics: e.g. phenothiazines, haloperidol.  . Lithium, tricyclic antidepressants, and methadone. Thank You,  Otis Peak  07/23/2018 4:16 PM

## 2018-07-23 NOTE — H&P (Addendum)
Advanced Heart Failure Team History & Physical    Primary Physician: [name], MD PCP-Cardiologist:  Garwin Brothers, MD  Reason for Consultation: LifeVest shock  HPI:    Carrie Walters is seen today for evaluation of LifeVest shock at the request of Dr Patria Mane.   Carrie Walters is a 63 y.o. female with a history of chronic atrial fibrillation, hypothyroidism, COPD, HTN, morbid obesity, VF arrest 02/21/18, and systolic heart failure due to NICM (diagnosed 02/2018)  Admitted 10/3-10/11/19. She had a VF arrest at [facility] on 02/21/18. She received CPR, 1 amp epi, and shock x1 with ROSC. She was then in Afib RVR and started on IV amio prior to transfer to [facility]. Echo showed newly reduced EF 25-30%. She had a LHC, which showed normal coronaries. EP was consulted and thought hypokalemia may have triggered VF arrest. She was transitioned to PO amio and given a LifeVest at DC. Unable to place ICD due to active leg wound. She required diuresis with IV lasix and then transitioned to lasix 40 mg daily. HF meds were optimized. DC weight 273 lbs.   Admitted to [facility] on 03/14/18 from [facility] increased dyspnea and respiratory failure. Hypotensive so entresto, coreg, and lasix held. Underwent TEE and DC-CV with restoration of NSR. She continued on amiodarone 200 mg twice a day. Discharged with Life Vest back on. Needs ICD but due to lower extremity wounds this has been delayed until the wounds heal. Discharge on 10/29.   She returned to [facility] 03/31/18 with increased shortness of breath. She was given prednisone and discharged to home.  She was to start prednisone but has not picked up because she was unable to pick up from the pharmacy.   She was last seen in HF clinic 04/2018. She was lightheaded at the time, but orthostatics were negative. No med changes were made. Weight was 277 lbs. She was supposed to follow up with Dr Shirlee Latch in 2 months with a repeat echo.    She has  missed several appointments in HF clinic and in EP clinic. She has been progressively SOB x1 month. She is also dizzy with standing. She had a warning from her LifeVest today when she walked into the HF clinic. She did not push delay treatment button and LifeVest then shocked her. She did not pass out. No CP or symptoms prior other than tingling down her left leg. She has BLE redness. No open sores. No known fevers, but has had chills over the last 2 weeks. Weight trending down at home 260 lbs. Taking all medications except she ran out of losartan recently. Eating high salt foods at home. Limits fluid intake. She lives at home with her daughter and uses [employer] for transportation.  Pertinent admission labs include: K 4.3, creatinine 1.22, mag 2.1, troponin 0.0, WBC 9.5.  EKG: NSR 92 bpm. QTc 494 ms.  CXR: No acute abnormalities. Chronic mild cardiomegaly. Chronic scarring at the right lung base.  Cardiac Studies   03/18/18 S/P DC-CV 200J --> NSR   03/18/18 TEE: EF 30-35% with mild LV dilation, RV mild to moderately dilated with mild to moderately decreased systolic function.  02/2018 LHC /RHC Normal coronaries EF 20%   Review of systems complete and found to be negative unless listed in HPI.   Home Medications Prior to Admission medications   Medication Sig Start Date End Date Taking? Authorizing Provider  albuterol (PROVENTIL HFA;VENTOLIN HFA) 108 (90 Base) MCG/ACT inhaler Inhale 2 puffs into  the lungs every 6 (six) hours as needed for wheezing or shortness of breath.   Yes [provider]  amiodarone (PACERONE) 200 MG tablet TAKE 2 TABLETS BY MOUTH EVERY DAY Patient taking differently: Take 200 mg by mouth daily.  06/18/18  Yes [name], MD  apixaban (ELIQUIS) 5 MG TABS tablet Take 1 tablet (5 mg total) by mouth 2 (two) times daily. 04/02/18  Yes [name], MD  bisoprolol (ZEBETA) 5 MG tablet Take 0.5 tablets (2.5 mg total) by mouth daily. 04/22/18  Yes [name], MD  furosemide (LASIX) 40 MG tablet These take 40 mg oral daily, and 20 mg oral every evening Patient taking differently: Take 40 mg by mouth daily.  04/02/18  Yes [name], MD  levothyroxine (SYNTHROID, LEVOTHROID) 25 MCG tablet Take 1 tablet (25 mcg total) by mouth daily before breakfast. Needs PCP/cardiologist visit with TSH check for further refills 03/02/18  Yes Bhagat, Bhavinkumar, PA  losartan (COZAAR) 25 MG tablet Take 1 tablet (25 mg total) by mouth daily. 04/22/18  Yes [name], MD  potassium chloride SA (K-DUR,KLOR-CON) 20 MEQ tablet Take 1 tablet (20 mEq total) by mouth daily. 04/22/18  Yes [name], MD  spironolactone (ALDACTONE) 25 MG tablet Take 1 tablet (25 mg total) by mouth daily. 04/02/18  Yes [name], MD    Past Medical History: Past Medical History:  Diagnosis Date  . Anxiety   . Arthritis   . Asthma   . Atrial fibrillation (HCC)   . Cellulitis   . Dysrhythmia   . Hypertension   . Hypothyroidism     Past Surgical History: Past Surgical History:  Procedure Laterality Date  . CARDIOVERSION N/A 03/18/2018   Procedure: CARDIOVERSION;  Surgeon: [name], MD;  Location: [facility] ENDOSCOPY;  Service: Cardiovascular;  Laterality: N/A;  . CHOLECYSTECTOMY    . LEFT HEART CATH AND CORONARY ANGIOGRAPHY N/A 02/25/2018   Procedure: LEFT HEART CATH AND CORONARY ANGIOGRAPHY;  Surgeon: [name], MD;  Location: [facility] INVASIVE CV LAB;  Service: Cardiovascular;  Laterality: N/A;  . TEE WITHOUT CARDIOVERSION N/A 03/18/2018   Procedure: TRANSESOPHAGEAL ECHOCARDIOGRAM (TEE);  Surgeon: [name], MD;  Location: [facility] ENDOSCOPY;  Service: Cardiovascular;  Laterality: N/A;  . UMBILICAL HERNIA REPAIR      Family History: Family History  Problem Relation Age of Onset  . Heart disease Father   . Atrial fibrillation Brother     Social History: Social History   Socioeconomic History  . Marital status: Single    Spouse name: Not on file   . Number of children: 1  . Years of education: Not on file  . Highest education level: 12th grade  Occupational History  . Not on file  Social Needs  . Financial resource strain: Somewhat hard  . Food insecurity:    Worry: Never true    Inability: Never true  . Transportation needs:    Medical: Yes    Non-medical: No  Tobacco Use  . Smoking status: Former Games developer  . Smokeless tobacco: Never Used  Substance and Sexual Activity  . Alcohol use: Never    Frequency: Never  . Drug use: Never  . Sexual activity: Not on file  Lifestyle  . Physical activity:    Days per week: Not on file    Minutes per session: Not on file  . Stress: Not on file  Relationships  . Social connections:    Talks on phone:  Not on file    Gets together: Not on file    Attends religious service: Not on file    Active member of club or organization: Not on file    Attends meetings of clubs or organizations: Not on file    Relationship status: Not on file  Other Topics Concern  . Not on file  Social History Narrative   Moved from [place]    Allergies:  Allergies  Allergen Reactions  . Metoprolol Tartrate Shortness Of Breath  . Biaxin [Clarithromycin] Other (See Comments)    Sores in mouth  . Levaquin [Levofloxacin] Other (See Comments)    Sores in mouth  . Contrast Media [Iodinated Diagnostic Agents] Rash    Objective:    Vital Signs:   Pulse Rate:  [89-96] 93 (03/03 1330) Resp:  [18-23] 22 (03/03 1330) BP: (92-111)/(40-72) 92/55 (03/03 1330) SpO2:  [94 %-100 %] 99 % (03/03 1330) Weight:  [117.9 kg] 117.9 kg (03/03 0855)    Weight change: Filed Weights   07/23/18 0855  Weight: 117.9 kg    Intake/Output:  No intake or output data in the 24 hours ending 07/23/18 1416    Physical Exam    General:  Obese. No resp difficulty HEENT: normal Neck: supple. JVP elevated. Carotids 2+ bilat; no bruits. No lymphadenopathy or thyromegaly appreciated. Cor: PMI nondisplaced. Regular rate &  rhythm. No rubs, gallops or murmurs. Lungs: distant Abdomen: soft, nontender, +distended. No hepatosplenomegaly. No bruits or masses. Good bowel sounds. Extremities: no cyanosis, clubbing, rash, BLE 2+ edema with erythema and warmth. No open sores Neuro: alert & orientedx3, cranial nerves grossly intact. moves all 4 extremities w/o difficulty. Affect pleasant   Telemetry   NSR 90s. Personally reviewed.  EKG    EKG 07/23/18: NSR 92 bpm. QTc 494 ms.   Labs   Basic Metabolic Panel: Recent Labs  Lab 07/23/18 0859  NA 138  K 4.3  CL 103  CO2 26  GLUCOSE 154*  BUN 20  CREATININE 1.22*  CALCIUM 9.4  MG 2.1  PHOS 3.6    Liver Function Tests: No results for input(s): AST, ALT, ALKPHOS, BILITOT, PROT, ALBUMIN in the last 168 hours. No results for input(s): LIPASE, AMYLASE in the last 168 hours. No results for input(s): AMMONIA in the last 168 hours.  CBC: Recent Labs  Lab 07/23/18 0859  WBC 9.5  HGB 12.6  HCT 41.0  MCV 98.8  PLT 298    Cardiac Enzymes: Recent Labs  Lab 07/23/18 0911  TROPONINI <0.03    BNP: BNP (last 3 results) Recent Labs    03/14/18 1154 04/02/18 1001 04/30/18 0959  BNP 99.7 187.8* 39.3    ProBNP (last 3 results) No results for input(s): PROBNP in the last 8760 hours.   CBG: No results for input(s): GLUCAP in the last 168 hours.  Coagulation Studies: No results for input(s): LABPROT, INR in the last 72 hours.   Imaging   Dg Chest Portable 1 View  Result Date: 07/23/2018 CLINICAL DATA:  Chest pain. EXAM: PORTABLE CHEST 1 VIEW COMPARISON:  Chest x-ray dated 03/14/2018 and CT scan of the abdomen dated 03/15/2018 FINDINGS: There is mild cardiomegaly. Pulmonary vascularity is normal. Slight scarring at the right lung base. No infiltrates or effusions. Aortic atherosclerosis.  No acute bone abnormality. IMPRESSION: No acute abnormalities. Chronic mild cardiomegaly. Chronic scarring at the right lung base. Aortic Atherosclerosis  (ICD10-I70.0). Electronically Signed   By: Francene Boyers M.D.   On: 07/23/2018 09:46  Medications:     Current Medications:    Infusions:      Patient Profile   Carrie Walters is a 63 y.o. female with a history of chronic atrial fibrillation, hypothyroidism, COPD, HTN, morbid obesity, VF arrest 02/21/18, and systolic heart failure due to NICM (diagnosed 02/2018)  Presented to ED after LifeVest shock in HF clinic waiting room.   Assessment/Plan   1. Lifevest shock with hx of VF Arrest 02/2018 - Awaiting transmission to find out what rhythm she was in. - K 4.2, mag 2.1 - Continue amiodarone 200 mg daily. Check TSH, T3, T4, and LFTs tomorrow am. 2. Acute on chronic Systolic Heart Failure, NICM. LHC 02/2018 normal cors. ECHO, limited  02/22/2018 EF 25-30%.  - Volume elevated on exam. - Start 80 mg IV lasix BID.  - Hold bisoprolol, losartan, and spiro with soft BP to make room for diuresis.  - Off digoxin with visual symptoms.  - Repeat echo.  3. PAF S/P DC-CV 02/2018 - In NSR on tele/EKG - Continue amiodarone 200 mg daily.  - Continue eliquis 5 mg BID. 4. Hypothyroidism  - On Synthroid. Check TSH, free T3, and free T4  5. COPD   - Referred for home O2.  6. Obesity  - Body mass index is 41.97 kg/m. 7. Snores - Referred for sleep study. Not done yet.  8. Prolonged QTc.  - QTc 494 today 9. Cellulitis - WBC 9.5. She has been having chills over the last 2 weeks. No open sores - Check blood cx.  - Will likely need to start antibiotics 10. VT / H/O VF arrest  On Life Vest Interrogation looks life VT. Start amio drip.K and Mag ok.  Medication concerns reviewed with patient and pharmacy team. Barriers identified: poor follow up.   Length of Stay: 0  Alford Highland, NP  07/23/2018, 2:16 PM  Advanced Heart Failure Team Pager [phone] (M-F; 7a - 4p)  Please contact [facility] for night-coverage after hours (4p -7a ) and weekends on [email or web address]  Patient seen with  NP, agree with the above note.   She was in the office this morning for regular visit. She was shocked by her Lifevest and went to the ER.  Interrogation showed VT terminated by defibrillation.  She is currently in NSR.  Has chronic exertional dyspnea, maybe worse recently.  She has been out of losartan, taking other meds.   On exam, JVP 14 cm with thick neck.  2+ edema to knees with redness bilateral lower legs, no open sores.  Distant BS.   Assessment/Plan: 1. Acute on chronic systolic CHF: TEE in 10/19 showed EF 30-35%.  Nonischemic cardiomyopathy, cath in 10/19 with no significant CAD. On exam, she is significantly volume overloaded.  It is possible that VT was triggered by CHF exacerbation.  - Lasix 80 mg IV bid, replace K.  - I will hold bisoprolol, losartan, and spironolactone for now as BP was initially low in the ER.  However, suspect she can restart in am.  - Repeat echo.  2. VT: She had VF arrest in 10/19, Lifevest placed as she was waiting for leg ulcers to heal to get ICD.  VT today, terminated by Lifevest.  ?Related to CHF exacerbation. Not hypokalemic, need to check Mg.   - Transitioned amiodarone to IV gtt. She was on po amiodarone at home.  - Restart bisoprolol in am if BP stable.  - Will need ICD.  Her lower legs are chronically erythematous.  She no  longer has open ulcerations.  Will discuss timing with EP.  3. Venous stasis changes: Suspect venous stasis dermatitis.  She had venous stasis ulcers in the past that have healed.  Less likely cellulitis, but will send blood cultures and cover with Ancef for the time being.  Peripheral arterial dopplers in the past did not show significant disease.  4. Atrial fibrillation: Paroxysmal.  She is in NSR currently.  - Continue amiodarone as above.  - Continue Eliquis.  5. COPD: She no longer smokes.   Marca Ancona 07/23/2018 5:01 PM

## 2018-07-23 NOTE — ED Triage Notes (Signed)
Pt in from cardiologist's office - Dr. Jearld Pies after Life Vest shock while in office. States she's been having palpitations x a few days, denies cp, or sob but is dizzy. Hx of MI 4 mo's ago

## 2018-07-23 NOTE — ED Notes (Signed)
Pt again repositioned and replaced in bed. Lines untangled

## 2018-07-23 NOTE — ED Notes (Signed)
Pt oob to bedsid ecommode for 500 cc urine

## 2018-07-23 NOTE — ED Provider Notes (Addendum)
[facility] EMERGENCY DEPARTMENT Provider Note   CSN: [id number] Arrival date & time: 07/23/18  4196    History   Chief Complaint Chief Complaint  Patient presents with  . Palpitations  . Ext Defibrillator Shock    HPI Carrie Walters is a 63 y.o. female.     HPI Patient is a 63 year old female who presents the emergency department after her LifeVest defibrillated her today at the congestive heart failure clinic.  She has a history of out of hospital cardiac arrest followed by diagnosis of a nonischemic cardiomyopathy in October 2019.  She has had no prior shocks from the LifeVest.  She states she was in her normal state of health today.  No preceding chest pain or palpitations.  She was standing in line at the clinic and was defibrillated.  She was standing at the time.  She did not fall.  She presents with blue dye on her chest and back.  No chest pain at this time.  Patient was brought to the emergency department from clinic via wheelchair   Past Medical History:  Diagnosis Date  . Anxiety   . Arthritis   . Asthma   . Atrial fibrillation (HCC)   . Cellulitis   . Dysrhythmia   . Hypertension   . Hypothyroidism     Patient Active Problem List   Diagnosis Date Noted  . CHF (congestive heart failure) (HCC) 04/02/2018  . Acute on chronic combined systolic and diastolic CHF (congestive heart failure) (HCC) 03/14/2018  . Diarrhea 03/14/2018  . Left upper quadrant pain 03/14/2018  . Cardiac arrest (HCC)   . Acute systolic CHF (congestive heart failure) (HCC)   . Pressure injury of skin 02/22/2018  . A-fib (HCC) 02/21/2018  . Atrial flutter (HCC) 12/27/2017  . Morbid obesity (HCC) 12/27/2017  . Hypothyroidism 12/21/2017    Past Surgical History:  Procedure Laterality Date  . CARDIOVERSION N/A 03/18/2018   Procedure: CARDIOVERSION;  Surgeon: [name], MD;  Location: [facility] ENDOSCOPY;  Service: Cardiovascular;  Laterality: N/A;  . CHOLECYSTECTOMY      . LEFT HEART CATH AND CORONARY ANGIOGRAPHY N/A 02/25/2018   Procedure: LEFT HEART CATH AND CORONARY ANGIOGRAPHY;  Surgeon: [name], MD;  Location: [facility] INVASIVE CV LAB;  Service: Cardiovascular;  Laterality: N/A;  . TEE WITHOUT CARDIOVERSION N/A 03/18/2018   Procedure: TRANSESOPHAGEAL ECHOCARDIOGRAM (TEE);  Surgeon: [name], MD;  Location: [facility] ENDOSCOPY;  Service: Cardiovascular;  Laterality: N/A;  . UMBILICAL HERNIA REPAIR       OB History   No obstetric history on file.      Home Medications    Prior to Admission medications   Medication Sig Start Date End Date Taking? Authorizing Provider  albuterol (PROVENTIL HFA;VENTOLIN HFA) 108 (90 Base) MCG/ACT inhaler Inhale 2 puffs into the lungs every 6 (six) hours as needed for wheezing or shortness of breath.   Yes [provider]  amiodarone (PACERONE) 200 MG tablet TAKE 2 TABLETS BY MOUTH EVERY DAY Patient taking differently: Take 200 mg by mouth daily.  06/18/18  Yes [name], MD  apixaban (ELIQUIS) 5 MG TABS tablet Take 1 tablet (5 mg total) by mouth 2 (two) times daily. 04/02/18  Yes [name], MD  bisoprolol (ZEBETA) 5 MG tablet Take 0.5 tablets (2.5 mg total) by mouth daily. 04/22/18  Yes [name], MD  furosemide (LASIX) 40 MG tablet These take 40 mg oral daily, and 20 mg oral every evening Patient taking  differently: Take 40 mg by mouth daily.  04/02/18  Yes [name], MD  levothyroxine (SYNTHROID, LEVOTHROID) 25 MCG tablet Take 1 tablet (25 mcg total) by mouth daily before breakfast. Needs PCP/cardiologist visit with TSH check for further refills 03/02/18  Yes Bhagat, Bhavinkumar, PA  losartan (COZAAR) 25 MG tablet Take 1 tablet (25 mg total) by mouth daily. 04/22/18  Yes [name], MD  potassium chloride SA (K-DUR,KLOR-CON) 20 MEQ tablet Take 1 tablet (20 mEq total) by mouth daily. 04/22/18  Yes [name], MD  spironolactone (ALDACTONE) 25 MG tablet Take 1 tablet (25  mg total) by mouth daily. 04/02/18  Yes [name], MD    Family History Family History  Problem Relation Age of Onset  . Heart disease Father   . Atrial fibrillation Brother     Social History Social History   Tobacco Use  . Smoking status: Former Games developer  . Smokeless tobacco: Never Used  Substance Use Topics  . Alcohol use: Never    Frequency: Never  . Drug use: Never     Allergies   Metoprolol tartrate; Biaxin [clarithromycin]; Levaquin [levofloxacin]; and Contrast media [iodinated diagnostic agents]   Review of Systems Review of Systems  All other systems reviewed and are negative.    Physical Exam Updated Vital Signs Wt 117.9 kg   BMI 41.97 kg/m   Physical Exam Vitals signs and nursing note reviewed.  Constitutional:      General: She is not in acute distress.    Appearance: She is well-developed.  HENT:     Head: Normocephalic and atraumatic.  Neck:     Musculoskeletal: Normal range of motion.  Cardiovascular:     Rate and Rhythm: Normal rate and regular rhythm.     Heart sounds: Normal heart sounds.  Pulmonary:     Effort: Pulmonary effort is normal.     Breath sounds: Normal breath sounds.  Abdominal:     General: There is no distension.     Palpations: Abdomen is soft.     Tenderness: There is no abdominal tenderness.  Musculoskeletal: Normal range of motion.  Skin:    General: Skin is warm and dry.  Neurological:     Mental Status: She is alert and oriented to person, place, and time.  Psychiatric:        Judgment: Judgment normal.      ED Treatments / Results  Labs (all labs ordered are listed, but only abnormal results are displayed) Labs Reviewed  BASIC METABOLIC PANEL - Abnormal; Notable for the following components:      Result Value   Glucose, Bld 154 (*)    Creatinine, Ser 1.22 (*)    GFR calc non Af Amer 47 (*)    GFR calc Af Amer 55 (*)    All other components within normal limits  CBC  TROPONIN I  MAGNESIUM   PHOSPHORUS  I-STAT TROPONIN, ED    EKG EKG Interpretation  Date/Time:  Tuesday July 23 2018 09:00:33 EST Ventricular Rate:  92 PR Interval:  194 QRS Duration: 96 QT Interval:  400 QTC Calculation: 494 R Axis:   15 Text Interpretation:  Normal sinus rhythm ST & T wave abnormality, consider lateral ischemia Prolonged QT Abnormal ECG No significant change was found Confirmed by Azalia Bilis ([id number]) on 07/23/2018 9:11:32 AM   Radiology No results found.  Procedures Procedures (including critical care time)  Medications Ordered in ED Medications - No data to display   Initial  Impression / Assessment and Plan / ED Course  I have reviewed the triage vital signs and the nursing notes.  Pertinent labs & imaging results that were available during my care of the patient were reviewed by me and considered in my medical decision making (see chart for details).        LifeVest defibrillation today.  Asymptomatic at this time.  Labs obtained.  Patient placed on the cardiac monitor.  Normal sinus rhythm at this time.  Placed on Zoll monitor here in the emergency department and pads placed.  LifeVest removed.  Cardiology to evaluate the patient for possible permanent defibrillator placement    Final Clinical Impressions(s) / ED Diagnoses   Final diagnoses:  Defibrillator discharge    ED Discharge Orders    None       Azalia Bilis, MD 07/23/18 [id number]    Azalia Bilis, MD 07/23/18 1053

## 2018-07-23 NOTE — ED Notes (Signed)
Pt standing at bedside and assisted to manage lines. Stands easily and denies pain or discomfort

## 2018-07-23 NOTE — Progress Notes (Signed)
Admission: Patient transferred from ED to room 2C 08 (2 Center). Started on Amiondarone drip running at 33.3 ml/hr. Alert and  Oriented x 4. Out of bed to commode independently. Safety precautions introduced. Patient verbally understands the importance of using the call bell for assistance. On room air with O2 SAT 95-100% No complaints of SOB, dizziness, headache, and/or chest pain.

## 2018-07-23 NOTE — ED Notes (Signed)
Admitting team at room.

## 2018-07-24 ENCOUNTER — Inpatient Hospital Stay (HOSPITAL_COMMUNITY): Payer: Medicaid Other

## 2018-07-24 LAB — BASIC METABOLIC PANEL
Anion gap: 11 (ref 5–15)
BUN: 14 mg/dL (ref 8–23)
CO2: 27 mmol/L (ref 22–32)
Calcium: 9.2 mg/dL (ref 8.9–10.3)
Chloride: 102 mmol/L (ref 98–111)
Creatinine, Ser: 1.13 mg/dL — ABNORMAL HIGH (ref 0.44–1.00)
GFR calc Af Amer: 60 mL/min — ABNORMAL LOW (ref >60)
GFR calc non Af Amer: 52 mL/min — ABNORMAL LOW (ref >60)
Glucose, Bld: 112 mg/dL — ABNORMAL HIGH (ref 70–99)
Potassium: 3.5 mmol/L (ref 3.5–5.1)
Sodium: 140 mmol/L (ref 135–145)

## 2018-07-24 LAB — T4, FREE: Free T4: 0.69 ng/dL — ABNORMAL LOW (ref 0.82–1.77)

## 2018-07-24 LAB — TSH: TSH: 15.121 u[IU]/mL — ABNORMAL HIGH (ref 0.350–4.500)

## 2018-07-24 LAB — MRSA PCR SCREENING: MRSA BY PCR: NEGATIVE

## 2018-07-24 MED ORDER — PERMETHRIN 5 % EX CREA
TOPICAL_CREAM | Freq: Once | CUTANEOUS | Status: AC
  Administered 2018-07-25: 05:00:00 via TOPICAL
  Filled 2018-07-24 (×4): qty 60

## 2018-07-24 MED ORDER — BISOPROLOL FUMARATE 5 MG PO TABS
2.5000 mg | ORAL_TABLET | Freq: Every day | ORAL | Status: DC
  Administered 2018-07-24 – 2018-07-25 (×2): 2.5 mg via ORAL
  Filled 2018-07-24 (×2): qty 1

## 2018-07-24 MED ORDER — PERMETHRIN 5 % EX CREA
TOPICAL_CREAM | Freq: Once | CUTANEOUS | Status: AC
  Administered 2018-07-24: 20:00:00 via TOPICAL
  Filled 2018-07-24: qty 60

## 2018-07-24 MED ORDER — SPIRONOLACTONE 12.5 MG HALF TABLET
12.5000 mg | ORAL_TABLET | Freq: Every day | ORAL | Status: DC
  Administered 2018-07-24 – 2018-07-25 (×2): 12.5 mg via ORAL
  Filled 2018-07-24 (×2): qty 1

## 2018-07-24 MED ORDER — POTASSIUM CHLORIDE CRYS ER 20 MEQ PO TBCR
40.0000 meq | EXTENDED_RELEASE_TABLET | Freq: Two times a day (BID) | ORAL | Status: DC
  Administered 2018-07-24 – 2018-07-25 (×4): 40 meq via ORAL
  Filled 2018-07-24 (×5): qty 2

## 2018-07-24 NOTE — Progress Notes (Signed)
Life vest representative made staff aware  the patients battery for life vest was missing. We called to the ER and asked Carrie Walters and Carrie Commander RN if they knew where it was. Millie RN called me back to update me she didn't know about it and that her belongings was placed in a bag. Life Vest representative gave a loaner battery for patient to use.

## 2018-07-24 NOTE — Progress Notes (Addendum)
Advanced Heart Failure Rounding Note  PCP-Cardiologist: Garwin Brothers, MD   Subjective:    Denies SOB. Says she had lice before and recently was treated for scabies.    Objective:   Weight Range: 120.3 kg Body mass index is 42.81 kg/m.   Vital Signs:   Temp:  [97.6 F (36.4 C)-98 F (36.7 C)] 98 F (36.7 C) (03/04 0818) Pulse Rate:  [70-97] 70 (03/04 0818) Resp:  [16-25] 20 (03/04 0818) BP: (89-124)/(22-83) 101/61 (03/04 0818) SpO2:  [94 %-100 %] 99 % (03/04 0818) Weight:  [120.3 kg] 120.3 kg (03/04 0340)    Weight change: Filed Weights   07/23/18 0855 07/24/18 0340  Weight: 117.9 kg 120.3 kg    Intake/Output:   Intake/Output Summary (Last 24 hours) at 07/24/2018 0950 Last data filed at 07/23/2018 1707 Gross per 24 hour  Intake -  Output 500 ml  Net -500 ml      Physical Exam    General:  Appears chronically ill. In the chair.  HEENT: Normal Neck: Supple. JVP 10 . Carotids 2+ bilat; no bruits. No lymphadenopathy or thyromegaly appreciated. Cor: PMI nondisplaced. Regular rate & rhythm. No rubs, gallops or murmurs. Lungs: Clear Abdomen: obese, soft, nontender, nondistended. No hepatosplenomegaly. No bruits or masses. Good bowel sounds. Extremities: No cyanosis, clubbing, rash, R and LLE 2+ erythema Neuro: Alert & orientedx3, cranial nerves grossly intact. moves all 4 extremities w/o difficulty. Affect pleasant   Telemetry   NSR no VT   EKG    N/A   Labs    CBC Recent Labs    07/23/18 0859  WBC 9.5  HGB 12.6  HCT 41.0  MCV 98.8  PLT 298   Basic Metabolic Panel Recent Labs    19/50/93 0859 07/24/18 0157  NA 138 140  K 4.3 3.5  CL 103 102  CO2 26 27  GLUCOSE 154* 112*  BUN 20 14  CREATININE 1.22* 1.13*  CALCIUM 9.4 9.2  MG 2.1  --   PHOS 3.6  --    Liver Function Tests No results for input(s): AST, ALT, ALKPHOS, BILITOT, PROT, ALBUMIN in the last 72 hours. No results for input(s): LIPASE, AMYLASE in the last 72  hours. Cardiac Enzymes Recent Labs    07/23/18 0911  TROPONINI <0.03    BNP: BNP (last 3 results) Recent Labs    03/14/18 1154 04/02/18 1001 04/30/18 0959  BNP 99.7 187.8* 39.3    ProBNP (last 3 results) No results for input(s): PROBNP in the last 8760 hours.   D-Dimer No results for input(s): DDIMER in the last 72 hours. Hemoglobin A1C No results for input(s): HGBA1C in the last 72 hours. Fasting Lipid Panel No results for input(s): CHOL, HDL, LDLCALC, TRIG, CHOLHDL, LDLDIRECT in the last 72 hours. Thyroid Function Tests Recent Labs    07/24/18 0157  TSH 15.121*    Other results:   Imaging     No results found.   Medications:     Scheduled Medications: . apixaban  5 mg Oral BID  . furosemide  80 mg Intravenous BID  . levothyroxine  25 mcg Oral QAC breakfast     Infusions: . amiodarone 30 mg/hr (07/24/18 0305)  .  ceFAZolin (ANCEF) IV 2 g (07/24/18 0306)     PRN Medications:  albuterol    Patient Profile  Carrie Walters a 63 y.o.femalewith a history of chronic atrial fibrillation, hypothyroidism, COPD, HTN, morbid obesity, VF arrest 02/21/18, and systolic heart failure due to NICM (  diagnosed 02/2018) Admitted after life vest shock. VT   Assessment/Plan  1. Lifevest shock with hx of VF Arrest 02/2018--> VT on interrogation.  Continue amio drip. EP consulted.  -K 3.5. Supp K. - Check Mag  2. Acute on chronic Systolic Heart Failure, NICM. LHC 02/2018 normal cors. ECHO, limited 02/22/2018 EF 25-30%.  Volume status remains elevated. Continue IV lasix.   BMET stable.  -Hold bisoprolol, losartan, and spiro with soft BP to make room for diuresis.  -Off digoxin with visual symptoms. 3. PAF S/P DC-CV 02/2018 - In NSR on tele/EKG -On amio drip. -Continue eliquis 5 mgBID. 4. Hypothyroidism -On Synthroid. -TSH 15. Makes me wonder if she has been taking her medication.  - Check free T3 and free T4.  5. COPD - Referred for home  O2.  6. Obesity Body mass index is 42.81 kg/m. 7. Snores - Referred for sleep study.Not done yet.  8. Prolonged QTc. 9. Cellulitis LLE - Blood Cx pending.   - Continue ancef  10. Lice  Order RID. Contact precautions.    Length of Stay: 1  [name], NP  07/24/2018, 9:50 AM  Advanced Heart Failure Team Pager [phone] (M-F; 7a - 4p)  Please contact [facility] for night-coverage after hours (4p -7a ) and weekends on [email or web address]  Patient seen with NP, agree with the above note.   No VT overnight on amiodarone gtt.  She has lice and possibly bed bugs.   On exam, JVP 10-11 cm with thick neck.  1+ edema to knees with redness bilateral lower legs, no open sores.  Distant BS.   Assessment/Plan: 1. Acute on chronic systolic CHF: TEE in 10/19 showed EF 30-35%.  Nonischemic cardiomyopathy, cath in 10/19 with no significant CAD. She is volume overlaoded on exam but not markedly.  It is possible that VT was triggered by CHF exacerbation.  - Lasix 80 mg IV bid today, replace K.  - She can restart spironolactone 12.5 mg daily and bisoprolol 2.5 mg daily today.  - Repeat echo.  2. VT: She had VF arrest in 10/19, Lifevest placed as she was waiting for leg ulcers to heal to get ICD.  VT today, terminated by Lifevest.  ?Related to CHF exacerbation. Not hypokalemic or hypomagnesemic.   - Transitioned amiodarone to IV gtt. She was on po amiodarone at home.  - Restarting bisoprolol as above.   - Will need ICD eventually.  Her lower legs are chronically erythematous, suspect venous stasis dermatitis though sent blood cultures and covering with Ancef for now.  She no longer has open ulcerations.  Will discuss timing with EP.  3. Venous stasis changes: Suspect venous stasis dermatitis.  She had venous stasis ulcers in the past that have healed.  Less likely cellulitis, but will send blood cultures and cover with Ancef for the time being.  Peripheral arterial dopplers in the past did not show  significant disease.  4. Atrial fibrillation: Paroxysmal.  She is in NSR currently.  - Continue amiodarone as above.  - Continue Eliquis.  5. COPD: She no longer smokes.  6. Lice: Will treat and get assistance from infection control.   Marca Ancona 07/24/2018 10:39 AM

## 2018-07-24 NOTE — Progress Notes (Signed)
Physician notified: Shirlee Latch At: 1712  Regarding: Exterminator stated bugs are body lice. OK to call pharmacy to get treatment started?   Order(s): Treatment per pharmacy.

## 2018-07-25 ENCOUNTER — Inpatient Hospital Stay (HOSPITAL_COMMUNITY): Payer: Medicaid Other

## 2018-07-25 ENCOUNTER — Telehealth (HOSPITAL_COMMUNITY): Payer: Self-pay

## 2018-07-25 DIAGNOSIS — I5023 Acute on chronic systolic (congestive) heart failure: Secondary | ICD-10-CM

## 2018-07-25 DIAGNOSIS — I509 Heart failure, unspecified: Secondary | ICD-10-CM

## 2018-07-25 LAB — ECHOCARDIOGRAM COMPLETE
Height: 66 in
Weight: 4204.61 oz

## 2018-07-25 LAB — BASIC METABOLIC PANEL
Anion gap: 13 (ref 5–15)
BUN: 14 mg/dL (ref 8–23)
CO2: 27 mmol/L (ref 22–32)
Calcium: 9.4 mg/dL (ref 8.9–10.3)
Chloride: 98 mmol/L (ref 98–111)
Creatinine, Ser: 1.22 mg/dL — ABNORMAL HIGH (ref 0.44–1.00)
GFR, EST AFRICAN AMERICAN: 55 mL/min — AB (ref >60)
GFR, EST NON AFRICAN AMERICAN: 47 mL/min — AB (ref >60)
Glucose, Bld: 120 mg/dL — ABNORMAL HIGH (ref 70–99)
Potassium: 3.6 mmol/L (ref 3.5–5.1)
SODIUM: 138 mmol/L (ref 135–145)

## 2018-07-25 LAB — ECHOCARDIOGRAM LIMITED
Height: 66 in
WEIGHTICAEL: 4204.61 [oz_av]

## 2018-07-25 LAB — T3, FREE: T3 FREE: 2 pg/mL (ref 2.0–4.4)

## 2018-07-25 LAB — MAGNESIUM: Magnesium: 2.2 mg/dL (ref 1.7–2.4)

## 2018-07-25 MED ORDER — BISOPROLOL FUMARATE 5 MG PO TABS
2.5000 mg | ORAL_TABLET | Freq: Once | ORAL | Status: AC
  Administered 2018-07-25: 2.5 mg via ORAL
  Filled 2018-07-25: qty 1

## 2018-07-25 MED ORDER — SPIRONOLACTONE 25 MG PO TABS
25.0000 mg | ORAL_TABLET | Freq: Every day | ORAL | Status: DC
  Administered 2018-07-26: 25 mg via ORAL
  Filled 2018-07-25: qty 1

## 2018-07-25 MED ORDER — HEPARIN SODIUM (PORCINE) 5000 UNIT/ML IJ SOLN: 5000.0000 [IU] | Freq: Three times a day (TID) | INTRAMUSCULAR | Status: DC

## 2018-07-25 MED ORDER — LEVOTHYROXINE SODIUM 50 MCG PO TABS
50.0000 ug | ORAL_TABLET | Freq: Every day | ORAL | Status: DC
  Administered 2018-07-26: 50 ug via ORAL
  Filled 2018-07-25: qty 1

## 2018-07-25 MED ORDER — BISOPROLOL FUMARATE 5 MG PO TABS
5.0000 mg | ORAL_TABLET | Freq: Every day | ORAL | Status: DC
  Administered 2018-07-26: 5 mg via ORAL
  Filled 2018-07-25: qty 1

## 2018-07-25 MED ORDER — LEVOTHYROXINE SODIUM 50 MCG PO TABS
50.0000 ug | ORAL_TABLET | Freq: Once | ORAL | Status: AC
  Administered 2018-07-25: 50 ug via ORAL
  Filled 2018-07-25: qty 1

## 2018-07-25 NOTE — Progress Notes (Addendum)
Advanced Heart Failure Rounding Note  PCP-Cardiologist: Garwin Brothers, MD   Subjective:    Remains on amio drip. 30 mg per hour and diuresing with IV lasix. I/O not accurate.   Denies SOB.   Objective:   Weight Range: 119.2 kg Body mass index is 42.42 kg/m.   Vital Signs:   Temp:  [97.9 F (36.6 C)-98.2 F (36.8 C)] 97.9 F (36.6 C) (03/05 0822) Pulse Rate:  [73-80] 73 (03/05 0500) Resp:  [16-23] 21 (03/05 0822) BP: (96-116)/(61-68) 98/65 (03/05 0822) SpO2:  [97 %-100 %] 100 % (03/05 0822) Weight:  [119.2 kg] 119.2 kg (03/05 0500) Last BM Date: 07/24/18  Weight change: Filed Weights   07/23/18 0855 07/24/18 0340 07/25/18 0500  Weight: 117.9 kg 120.3 kg 119.2 kg    Intake/Output:   Intake/Output Summary (Last 24 hours) at 07/25/2018 0915 Last data filed at 07/25/2018 0700 Gross per 24 hour  Intake 365.81 ml  Output 200 ml  Net 165.81 ml      Physical Exam    General:  Sitting in the chair.  No resp difficulty HEENT: normal Neck: supple. JVP 9-10 . Carotids 2+ bilat; no bruits. No lymphadenopathy or thryomegaly appreciated. Cor: PMI nondisplaced. Regular rate & rhythm. No rubs, gallops or murmurs. Lungs: clear on 2 liters oxygen.  Abdomen: soft, nontender, nondistended. No hepatosplenomegaly. No bruits or masses. Good bowel sounds. Extremities: no cyanosis, clubbing, rash, R and LLE hyperpigmentation 1+ edema Neuro: alert & orientedx3, cranial nerves grossly intact. moves all 4 extremities w/o difficulty. Affect pleasant   Telemetry   NSR 70s. No VT overnight.   EKG    N/A   Labs    CBC Recent Labs    07/23/18 0859  WBC 9.5  HGB 12.6  HCT 41.0  MCV 98.8  PLT 298   Basic Metabolic Panel Recent Labs    49/67/59 0859 07/24/18 0157 07/25/18 0326  NA 138 140 138  K 4.3 3.5 3.6  CL 103 102 98  CO2 26 27 27   GLUCOSE 154* 112* 120*  BUN 20 14 14   CREATININE 1.22* 1.13* 1.22*  CALCIUM 9.4 9.2 9.4  MG 2.1  --  2.2  PHOS 3.6  --   --     Liver Function Tests No results for input(s): AST, ALT, ALKPHOS, BILITOT, PROT, ALBUMIN in the last 72 hours. No results for input(s): LIPASE, AMYLASE in the last 72 hours. Cardiac Enzymes Recent Labs    07/23/18 0911  TROPONINI <0.03    BNP: BNP (last 3 results) Recent Labs    03/14/18 1154 04/02/18 1001 04/30/18 0959  BNP 99.7 187.8* 39.3    ProBNP (last 3 results) No results for input(s): PROBNP in the last 8760 hours.   D-Dimer No results for input(s): DDIMER in the last 72 hours. Hemoglobin A1C No results for input(s): HGBA1C in the last 72 hours. Fasting Lipid Panel No results for input(s): CHOL, HDL, LDLCALC, TRIG, CHOLHDL, LDLDIRECT in the last 72 hours. Thyroid Function Tests Recent Labs    07/24/18 0157 07/24/18 1016  TSH 15.121*  --   T3FREE  --  2.0    Other results:   Imaging    No results found.   Medications:     Scheduled Medications: . apixaban  5 mg Oral BID  . bisoprolol  2.5 mg Oral Daily  . furosemide  80 mg Intravenous BID  . levothyroxine  25 mcg Oral QAC breakfast  . potassium chloride  40 mEq Oral  BID  . spironolactone  12.5 mg Oral Daily    Infusions: . amiodarone Stopped (07/25/18 0659)  .  ceFAZolin (ANCEF) IV 2 g (07/25/18 0522)    PRN Medications: albuterol    Patient Profile  Carrie Walters a 63 y.o.femalewith a history of chronic atrial fibrillation, hypothyroidism, COPD, HTN, morbid obesity, VF arrest 02/21/18, and systolic heart failure due to NICM (diagnosed 02/2018) Admitted after life vest shock. VT   Assessment/Plan  1. Lifevest shock with hx of VF Arrest 02/2018--> VT on interrogation.  Continue amio drip. EP evaluation hold. She will need Life Vest back on at discharge.   Supp K. Mag stable.  2. Acute on chronic Systolic Heart Failure, NICM. LHC 02/2018 normal cors. ECHO, limited 02/22/2018 EF 25-30%. ECHO has been ordered.  - Continue IV lasix. Renal function stable.  -Increase bisoprolol  5 mg daily.  -Off digoxin with visual symptoms. 3. PAF S/P DC-CV 02/2018 -Maintaining NSR.  -On amio drip.  -Continue eliquis 5 mgBID. 4. Hypothyroidism -TSH 15. T3 2  T4 0.69 - Increase synthroid to 50 mcg daily. She will need follow up with PCP to monitor TSH.  5. COPD She has home oxygen. 6. Obesity Body mass index is 42.42 kg/m. 7. Snores - Referred for sleep study.Not done yet.  8. Prolonged QTc. 9. Cellulitis LLE - Blood Cx - NGTD   - Continue ancef   10. Lice  Receiving treatment for body lice.  Contact precautions.    Length of Stay: 2  [name], NP  07/25/2018, 9:15 AM  Advanced Heart Failure Team Pager [phone] (M-F; 7a - 4p)  Please contact [facility] for night-coverage after hours (4p -7a ) and weekends on [email or web address]  Patient seen with NP, agree with the above note.   No VT. She remains on amiodarone gtt to reload.   On exam, JVP 9-10 cm.  Regular S1S2, distant BS.   Would give 1 more day of IV diuresis.  Increase spironolactone to 25 mg daily and bisoprolol to 5 mg daily.  Restart losartan at low dose tomorrow.  Echo was a poor study, repeat with [facility].   Continue Ancef for ?cellulitis, can complete 10 days po Keflex at discharge.    Treating for body lice.   No ICD this admission given lice (cannot take to sterile lab).  Will continue Lifevest and have her followup with EP, will need eventual ICD.  As above, increasing bisoprolol.   Mobilize, hopefully home tomorrow.   Marca Ancona 07/25/2018 12:40 PM

## 2018-07-25 NOTE — Progress Notes (Signed)
  Echocardiogram 2D Echocardiogram has been performed.  Celene Skeen 07/25/2018, 1:54 PM

## 2018-07-25 NOTE — Plan of Care (Signed)
  Problem: Activity: Goal: Capacity to carry out activities will improve Outcome: Progressing   Problem: Education: Goal: Knowledge of General Education information will improve Description Including pain rating scale, medication(s)/side effects and non-pharmacologic comfort measures Outcome: Progressing   Problem: Activity: Goal: Risk for activity intolerance will decrease Outcome: Progressing   Problem: Nutrition: Goal: Adequate nutrition will be maintained Outcome: Progressing   Problem: Safety: Goal: Ability to remain free from injury will improve Outcome: Progressing

## 2018-07-25 NOTE — Progress Notes (Signed)
  Echocardiogram 2D Echocardiogram has been performed.  Carrie Walters 07/25/2018, 12:00 PM

## 2018-07-25 NOTE — Telephone Encounter (Signed)
Verification of transportation form faxed to [facility]. Confirmation received

## 2018-07-26 LAB — BASIC METABOLIC PANEL
Anion gap: 11 (ref 5–15)
BUN: 18 mg/dL (ref 8–23)
CALCIUM: 9.7 mg/dL (ref 8.9–10.3)
CO2: 30 mmol/L (ref 22–32)
Chloride: 97 mmol/L — ABNORMAL LOW (ref 98–111)
Creatinine, Ser: 1.26 mg/dL — ABNORMAL HIGH (ref 0.44–1.00)
GFR calc Af Amer: 53 mL/min — ABNORMAL LOW (ref >60)
GFR, EST NON AFRICAN AMERICAN: 45 mL/min — AB (ref >60)
Glucose, Bld: 111 mg/dL — ABNORMAL HIGH (ref 70–99)
Potassium: 3.6 mmol/L (ref 3.5–5.1)
Sodium: 138 mmol/L (ref 135–145)

## 2018-07-26 MED ORDER — DOXYCYCLINE HYCLATE 100 MG PO TABS: 100.0000 mg | ORAL_TABLET | Freq: Two times a day (BID) | ORAL | 0 refills | Status: DC

## 2018-07-26 MED ORDER — BISOPROLOL FUMARATE 5 MG PO TABS: 5.0000 mg | ORAL_TABLET | Freq: Every day | ORAL | 5 refills | Status: AC

## 2018-07-26 MED ORDER — POTASSIUM CHLORIDE CRYS ER 20 MEQ PO TBCR
40.0000 meq | EXTENDED_RELEASE_TABLET | Freq: Once | ORAL | Status: AC
  Administered 2018-07-26: 40 meq via ORAL

## 2018-07-26 MED ORDER — LEVOTHYROXINE SODIUM 50 MCG PO TABS: 50.0000 ug | ORAL_TABLET | Freq: Every day | ORAL | 6 refills | Status: AC

## 2018-07-26 MED ORDER — AMIODARONE HCL 200 MG PO TABS
400.0000 mg | ORAL_TABLET | Freq: Two times a day (BID) | ORAL | Status: DC
  Administered 2018-07-26: 400 mg via ORAL
  Filled 2018-07-26: qty 2

## 2018-07-26 MED ORDER — FUROSEMIDE 40 MG PO TABS
40.0000 mg | ORAL_TABLET | Freq: Every day | ORAL | Status: DC
  Administered 2018-07-26: 40 mg via ORAL
  Filled 2018-07-26: qty 1

## 2018-07-26 MED ORDER — DOXYCYCLINE HYCLATE 100 MG PO TABS
100.0000 mg | ORAL_TABLET | Freq: Two times a day (BID) | ORAL | Status: DC
  Administered 2018-07-26: 100 mg via ORAL
  Filled 2018-07-26: qty 1

## 2018-07-26 MED ORDER — AMIODARONE HCL 200 MG PO TABS: ORAL_TABLET | ORAL | 6 refills | Status: DC

## 2018-07-26 NOTE — Discharge Summary (Signed)
Advanced Heart Failure Team  Discharge Summary   Patient ID: Carrie Walters MRN: [id number], DOB/AGE: [birth year] 63 y.o. Admit date: 07/23/2018 D/C date:     07/26/2018   Primary Discharge Diagnoses:  1. VT  2.   Hospital Course:  Carrie Walters a 63 y.o.femalewith a history of chronic atrial fibrillation, hypothyroidism, COPD, HTN, morbid obesity, VF arrest 02/21/18, and systolic heart failure due to NICM (diagnosed 02/2018).  Admitted after life vest shock noted to be VT on interrogation. Loaded on IV amiodarone and she will continue po amiodarone. She was also treated for LLE cellulitis. She will continue Life Vest at home.   1. Lifevest shockwith hx ofVF Arrest 02/2018--> VT on interrogation.  Stop amio drip. Start 400 mg twice a day for 7 days then 200 mg twice a day EP evaluation hold due to concern for infection.  - She will continue Life Vest and has follow up with EP later this month.    2.Acute on chronic Systolic Heart Failure, NICM. LHC 02/2018 normal cors. ECHO, limited 02/22/2018 EF 25-30%.ECHO --> EF has recovered 60-65%.   - Diuresed with IV lasix and transitioned to back home lasix regimen.   -Continue bisoprolol 5 mg daily.  -Offdigoxin with visual symptoms.EF 60-65%  3. PAF S/P DC-CV 02/2018 -Maintaining NSR.  -Restart po amio today.   -Continue eliquis 5 mgBID. 4. Hypothyroidism -TSH 15. T3 2  T4 0.69 - Synthroid was increased to 50 mcg daily. She will need follow up with PCP to monitor TSH.  5. COPD She has home oxygen.  6. Obesity Body mass index is 42.42 kg/m. 7. Snores - Referred for sleep study.Not done yet.  8. Prolonged QTc. 9. Cellulitis LLE - Blood Cx - NGTD   - Stop ancef. Start doxycycline 100 mg twice a day x 7 days.   10. Lice  Received treatment for body lice. Contact precautions.   We have  set up F/U HF clinic. EF has recovered. I discussed the importance of treating Lice. Her daughter was aware.   She received  transportation assistance for home. She uses [facility] to get to other medical appointments.    Discharge Weight: 262 pounds.   Discharge Vitals: Blood pressure 120/81, pulse 68, temperature 97.8 F (36.6 C), temperature source Oral, resp. rate 18, height 5\' 6"  (1.676 m), weight 119.2 kg, SpO2 97 %.  Labs: Lab Results  Component Value Date   WBC 9.5 07/23/2018   HGB 12.6 07/23/2018   HCT 41.0 07/23/2018   MCV 98.8 07/23/2018   PLT 298 07/23/2018    Recent Labs  Lab 07/26/18 0202  NA 138  K 3.6  CL 97*  CO2 30  BUN 18  CREATININE 1.26*  CALCIUM 9.7  GLUCOSE 111*   No results found for: CHOL, HDL, LDLCALC, TRIG BNP (last 3 results) Recent Labs    03/14/18 1154 04/02/18 1001 04/30/18 0959  BNP 99.7 187.8* 39.3    ProBNP (last 3 results) No results for input(s): PROBNP in the last 8760 hours.   Diagnostic Studies/Procedures   No results found.  Discharge Medications   Allergies as of 07/26/2018      Reactions   Metoprolol Tartrate Shortness Of Breath   Biaxin [clarithromycin] Other (See Comments)   Sores in mouth   Levaquin [levofloxacin] Other (See Comments)   Sores in mouth   Contrast Media [iodinated Diagnostic Agents] Rash      Medication List    STOP taking these medications   losartan 25 MG tablet  Commonly known as:  COZAAR     TAKE these medications   albuterol 108 (90 Base) MCG/ACT inhaler Commonly known as:  PROVENTIL HFA;VENTOLIN HFA Inhale 2 puffs into the lungs every 6 (six) hours as needed for wheezing or shortness of breath.   amiodarone 200 MG tablet Commonly known as:  PACERONE Take 400 mg twice a day x 7 days then 200 mg twice a day What changed:    how much to take  how to take this  when to take this  additional instructions   apixaban 5 MG Tabs tablet Commonly known as:  ELIQUIS Take 1 tablet (5 mg total) by mouth 2 (two) times daily.   bisoprolol 5 MG tablet Commonly known as:  ZEBETA Take 1 tablet (5 mg total) by  mouth daily. What changed:  how much to take   doxycycline 100 MG tablet Commonly known as:  VIBRA-TABS Take 1 tablet (100 mg total) by mouth every 12 (twelve) hours.   furosemide 40 MG tablet Commonly known as:  LASIX These take 40 mg oral daily, and 20 mg oral every evening What changed:    how much to take  how to take this  when to take this  additional instructions   levothyroxine 50 MCG tablet Commonly known as:  SYNTHROID, LEVOTHROID Take 1 tablet (50 mcg total) by mouth daily before breakfast. Start taking on:  July 27, 2018 What changed:    medication strength  how much to take  additional instructions   potassium chloride SA 20 MEQ tablet Commonly known as:  K-DUR,KLOR-CON Take 1 tablet (20 mEq total) by mouth daily.   spironolactone 25 MG tablet Commonly known as:  ALDACTONE Take 1 tablet (25 mg total) by mouth daily.       Disposition   The patient will be discharged in stable condition to home. Discharge Instructions    (HEART FAILURE PATIENTS) Call MD:  Anytime you have any of the following symptoms: 1) 3 pound weight gain in 24 hours or 5 pounds in 1 week 2) shortness of breath, with or without a dry hacking cough 3) swelling in the hands, feet or stomach 4) if you have to sleep on extra pillows at night in order to breathe.   Complete by:  As directed    Diet - low sodium heart healthy   Complete by:  As directed    Heart Failure patients record your daily weight using the same scale at the same time of day   Complete by:  As directed    Increase activity slowly   Complete by:  As directed      Follow-up Information    [name], MD Follow up on 08/19/2018.   Specialty:  Cardiology Why:  at [address] information: [address] [place] [address] [phone]        [facility] Follow up on 08/08/2018.   Specialty:  Cardiology Why:  at [address] Contact  information: [id number] [place] [address] [phone]            Duration of Discharge Encounter: Greater than 35 minutes   Signed, [name]  NP-C  07/26/2018, 10:33 AM

## 2018-07-26 NOTE — Discharge Instructions (Signed)

## 2018-07-26 NOTE — Care Management Note (Signed)
Case Management Note  Patient Details  Name: Mazella Muthig MRN: [id number] Date of Birth: Sep 30, 1955  Subjective/Objective:    Pt admitted post life vest shock - noted to be VT on interrogation.                  Action/Plan:  PTA independent from home with daughter.  Pt informed CM that pt has PCP and denied barriers with paying for meds.  Pt informed CM that she weighs daily and adheres to low salt diet.  Per attending group - pt needs to continue Life Vest at discharge.   Pt has Life Vest at bedside  and denied concerns with resuming it at discharge.  CM signing off   Expected Discharge Date:  07/26/18               Expected Discharge Plan:  Home/Self Care(Independent from home with daughter, has PCP and denied barriers with paying for meds)  In-House Referral:  Clinical Social Work  Discharge planning Services  CM Consult  Post Acute Care Choice:    Choice offered to:     DME Arranged:    DME Agency:     HH Arranged:    HH Agency:     Status of Service:  Completed, signed off  If discussed at [employer], dates discussed:    Additional Comments:  Cherylann Parr, RN 07/26/2018, 11:49 AM

## 2018-07-26 NOTE — Progress Notes (Signed)
Provided RN with cab voucher for patient to [place], [place]. Cab voucher was approved by management(AD).  Antony Blackbird, MSW, [facility] Clinical Social Worker [phone]

## 2018-07-26 NOTE — Progress Notes (Addendum)
Advanced Heart Failure Rounding Note  PCP-Cardiologist: Garwin Brothers, MD   Subjective:    Denies SOB.   Objective:   Weight Range: 119.2 kg Body mass index is 42.42 kg/m.   Vital Signs:   Temp:  [97.7 F (36.5 C)-97.9 F (36.6 C)] 97.9 F (36.6 C) (03/06 0345) Pulse Rate:  [62-68] 62 (03/06 0345) Resp:  [16-20] 18 (03/06 0345) BP: (96-106)/(59-93) 101/76 (03/06 0345) SpO2:  [96 %-98 %] 96 % (03/06 0345) Weight:  [119.2 kg] 119.2 kg (03/06 0440) Last BM Date: 07/25/18  Weight change: Filed Weights   07/24/18 0340 07/25/18 0500 07/26/18 0440  Weight: 120.3 kg 119.2 kg 119.2 kg    Intake/Output:   Intake/Output Summary (Last 24 hours) at 07/26/2018 0834 Last data filed at 07/26/2018 0441 Gross per 24 hour  Intake 1241.23 ml  Output 1350 ml  Net -108.77 ml      Physical Exam    General:   No resp difficulty. Sitting in the chair.  HEENT: normal Neck: supple. JVP 6-7  Carotids 2+ bilat; no bruits. No lymphadenopathy or thryomegaly appreciated. Cor: PMI nondisplaced. Regular rate & rhythm. No rubs, gallops or murmurs. Lungs: clear Abdomen: soft, nontender, nondistended. No hepatosplenomegaly. No bruits or masses. Good bowel sounds. Extremities: no cyanosis, clubbing, rash, trace edema. R and LLE hyperpigmentation.  Neuro: alert & orientedx3, cranial nerves grossly intact. moves all 4 extremities w/o difficulty. Affect pleasant]   Telemetry  NSR 70-80s  No VT  EKG    N/A   Labs    CBC Recent Labs    07/23/18 0859  WBC 9.5  HGB 12.6  HCT 41.0  MCV 98.8  PLT 298   Basic Metabolic Panel Recent Labs    73/71/06 0859  07/25/18 0326 07/26/18 0202  NA 138   < > 138 138  K 4.3   < > 3.6 3.6  CL 103   < > 98 97*  CO2 26   < > 27 30  GLUCOSE 154*   < > 120* 111*  BUN 20   < > 14 18  CREATININE 1.22*   < > 1.22* 1.26*  CALCIUM 9.4   < > 9.4 9.7  MG 2.1  --  2.2  --   PHOS 3.6  --   --   --    < > = values in this interval not displayed.    Liver Function Tests No results for input(s): AST, ALT, ALKPHOS, BILITOT, PROT, ALBUMIN in the last 72 hours. No results for input(s): LIPASE, AMYLASE in the last 72 hours. Cardiac Enzymes Recent Labs    07/23/18 0911  TROPONINI <0.03    BNP: BNP (last 3 results) Recent Labs    03/14/18 1154 04/02/18 1001 04/30/18 0959  BNP 99.7 187.8* 39.3    ProBNP (last 3 results) No results for input(s): PROBNP in the last 8760 hours.   D-Dimer No results for input(s): DDIMER in the last 72 hours. Hemoglobin A1C No results for input(s): HGBA1C in the last 72 hours. Fasting Lipid Panel No results for input(s): CHOL, HDL, LDLCALC, TRIG, CHOLHDL, LDLDIRECT in the last 72 hours. Thyroid Function Tests Recent Labs    07/24/18 0157 07/24/18 1016  TSH 15.121*  --   T3FREE  --  2.0    Other results:   Imaging    No results found.   Medications:     Scheduled Medications: . apixaban  5 mg Oral BID  . bisoprolol  5 mg  Oral Daily  . furosemide  80 mg Intravenous BID  . levothyroxine  50 mcg Oral QAC breakfast  . potassium chloride  40 mEq Oral BID  . spironolactone  25 mg Oral Daily    Infusions: . amiodarone 30 mg/hr (07/26/18 0400)  .  ceFAZolin (ANCEF) IV 2 g (07/26/18 0344)    PRN Medications: albuterol    Patient Profile  Cathline Sitze a 63 y.o.femalewith a history of chronic atrial fibrillation, hypothyroidism, COPD, HTN, morbid obesity, VF arrest 02/21/18, and systolic heart failure due to NICM (diagnosed 02/2018) Admitted after life vest shock. VT   Assessment/Plan  1. Lifevest shock with hx of VF Arrest 02/2018--> VT on interrogation.  Stop amio drip. Start 400 mg twice a day for 7 days then 200 mg twice a day EP evaluation hold due to concern for infection. She will need Life Vest back on at discharge.   2. Acute on chronic Systolic Heart Failure, NICM. LHC 02/2018 normal cors. ECHO, limited 02/22/2018 EF 25-30%. ECHO --> EF has recovered  60-65%.   - Volume status stable. Stop IV lasix.  Start po lasix 40 mg daily.  -Continue bisoprolol 5 mg daily.  -Off digoxin with visual symptoms. 3. PAF S/P DC-CV 02/2018 -Maintaining NSR.  -Restart po amio today.   -Continue eliquis 5 mgBID. 4. Hypothyroidism -TSH 15. T3 2  T4 0.69 - Continue synthroid to 50 mcg daily. She will need follow up with PCP to monitor TSH.  5. COPD She has home oxygen.  6. Obesity Body mass index is 42.42 kg/m. 7. Snores - Referred for sleep study.Not done yet.  8. Prolonged QTc. 9. Cellulitis LLE - Blood Cx - NGTD   - Stop ancef. Start doxycycline 100 mg twice a day x 7 days.   10. Lice  Receiving treatment for body lice.  Contact precautions.   She can go home today. She does not have transportation to get home. She uses [facility] to get to appointments.   We will set up F/U HF clinic. EF has recovered. She will eventually need EP follow up for possible device. I discussed the importance treating Lice. Her daughter was aware.   Length of Stay: 3  [name], NP  07/26/2018, 8:34 AM  Advanced Heart Failure Team Pager [phone] (M-F; 7a - 4p)  Please contact [facility] for night-coverage after hours (4p -7a ) and weekends on [email or web address]  Patient seen with NP, agree with the above note.   No further VT.  We can resume po amiodarone.  She will need ICD, but cannot get yet until lice fully treated.   - Will arrange followup with EP.  - She will wear Lifevest until she gets ICD.  - Would eventually like to get a cardiac MRI to look for infiltrative disease, ARVD (EF back to normal - 60-65% - on echo but still with VT) but not this hospitalization given lice infestation.   Volume status improved, transition to po Lasix.   Legs less red, transition from Ancef to complete course of po doxycycline.   Needs her home treated for lice infestation.   She may go home today. Followup in CHF clinic and with EP.   Marca Ancona 07/26/2018 1:00  PM

## 2018-07-26 NOTE — Progress Notes (Signed)
CARDIAC REHAB PHASE I   Left CHF booklet, low sodium diets, and typed instructions with pt. Stressed importance of daily weights, and encouraged attendance follow-up appointments. Pt not appropriate for CRP II.   [id number] Reynold Bowen, RN BSN 07/26/2018 12:12 PM

## 2018-07-26 NOTE — Progress Notes (Signed)
Pt given discharge instructions with understanding. Pt has no questions at this time. Pt given prescriptions. Pt iv and monitor d/c. Cab service called for pt.

## 2018-07-28 LAB — CULTURE, BLOOD (ROUTINE X 2)
CULTURE: NO GROWTH
Culture: NO GROWTH

## 2018-08-08 ENCOUNTER — Inpatient Hospital Stay (HOSPITAL_COMMUNITY): Admit: 2018-08-08 | Payer: Medicaid Other

## 2018-08-09 ENCOUNTER — Other Ambulatory Visit: Payer: Self-pay | Admitting: Physician Assistant

## 2018-08-14 ENCOUNTER — Encounter (HOSPITAL_COMMUNITY): Payer: Self-pay

## 2018-08-14 ENCOUNTER — Other Ambulatory Visit: Payer: Self-pay

## 2018-08-14 ENCOUNTER — Ambulatory Visit (HOSPITAL_COMMUNITY)
Admission: RE | Admit: 2018-08-14 | Discharge: 2018-08-14 | Disposition: A | Discharge status: Home / Self Care | Payer: Medicaid Other | Source: Ambulatory Visit | Attending: Cardiology | Admitting: Cardiology

## 2018-08-14 VITALS — HR 80 | Wt 268.0 lb

## 2018-08-14 DIAGNOSIS — I480 Paroxysmal atrial fibrillation: Secondary | ICD-10-CM

## 2018-08-14 DIAGNOSIS — I4901 Ventricular fibrillation: Secondary | ICD-10-CM

## 2018-08-14 DIAGNOSIS — I5042 Chronic combined systolic (congestive) and diastolic (congestive) heart failure: Secondary | ICD-10-CM

## 2018-08-14 MED ORDER — FUROSEMIDE 40 MG PO TABS: ORAL_TABLET | ORAL | 3 refills | Status: DC

## 2018-08-14 NOTE — Progress Notes (Signed)
Made multiple attempts to reach patient via phone. No mychart set up. No email in system. Mailed avs to pt address

## 2018-08-14 NOTE — Addendum Note (Signed)
Encounter addended by: Nicole Cella, RN on: 08/14/2018 4:28 PM  Actions taken: Clinical Note Signed

## 2018-08-14 NOTE — Patient Instructions (Addendum)
    Lab work will need to be done in 8 weeks.  CONTINUE Amiodarone 200mg  (1 tab) twice a day.  CONTINUE LifeVest  CONTINUE Furosemide 40mg  (1 tab) every morning and 20mg  (0.5 tab) every evening. You may take an 20mg  (0.5 tab) a day if you weight is 272lbs or greater.  You were referred to our Child psychotherapist.   Follow up with a Televisit in 2 weeks.

## 2018-08-14 NOTE — Progress Notes (Signed)
Heart Failure TeleHealth Note  Due to national recommendations of social distancing due to COVID 19, Audio/video telehealth visit is felt to be most appropriate for this patient at this time.  See MyChart message from today for patient consent regarding telehealth for [facility].  Date:  08/14/2018   ID:  Carrie Walters, DOB 01/09/1956, MRN [id number]  Location: Home  Provider location: [address], [place] Type of Visit: Established  PCP:  [name], MD  Cardiologist:  Garwin Brothers, MD Primary HF: Dr Shirlee Latch   Chief Complaint: Heart Failure   History of Present Illness: Carrie Walters is a 63 y.o. female who presents via audio/video conferencing for a telehealth visit today.  Carrie Walters a 63 y.o.femalewith a history of chronic atrial fibrillation, hypothyroidism, COPD, HTN, morbid obesity, asthma, VF arrest 02/21/18, and systolic heart failure due to NICM (diagnosed 02/2018).  Admitted 3/3 through 07/26/18 after life vest shock noted to be VT on interrogation. Loaded on IV amiodarone and she will continue po amiodarone. She was also treated for LLE cellulitis. She will continue Life Vest at home. Of note she had body lice that was treated during her hospitalization.   Since discharge from [facility] on 07/26/18 she has been evaluated at [facility] ED she was evaluated and sent home. On 08/12/18 she was evaluated by EMS with stable vital signs.    Today, she denies symptoms of palpitations, chest pain, orthopnea, PND,claudication, dizziness, presyncope, syncope, or bleeding issues.  She sleeps in the chair for comfort. She remains SOB with exertion. She denies issues with Life Vest. Weight at home has been 268 pounds. She has had trouble getting food.   The patient is tolerating medications without difficulties and is otherwise without complaint today.  She denies symptoms of cough, fevers, chills, or new SOB worrisome for COVID 19.    Past Medical  History:  Diagnosis Date  . Anxiety   . Arthritis   . Asthma   . Atrial fibrillation (HCC)   . Cellulitis   . Dysrhythmia   . Hypertension   . Hypothyroidism    Past Surgical History:  Procedure Laterality Date  . CARDIOVERSION N/A 03/18/2018   Procedure: CARDIOVERSION;  Surgeon: [name], MD;  Location: [facility] ENDOSCOPY;  Service: Cardiovascular;  Laterality: N/A;  . CHOLECYSTECTOMY    . LEFT HEART CATH AND CORONARY ANGIOGRAPHY N/A 02/25/2018   Procedure: LEFT HEART CATH AND CORONARY ANGIOGRAPHY;  Surgeon: [name], MD;  Location: [facility] INVASIVE CV LAB;  Service: Cardiovascular;  Laterality: N/A;  . TEE WITHOUT CARDIOVERSION N/A 03/18/2018   Procedure: TRANSESOPHAGEAL ECHOCARDIOGRAM (TEE);  Surgeon: [name], MD;  Location: [facility] ENDOSCOPY;  Service: Cardiovascular;  Laterality: N/A;  . UMBILICAL HERNIA REPAIR       Current Outpatient Medications  Medication Sig Dispense Refill  . albuterol (PROVENTIL HFA;VENTOLIN HFA) 108 (90 Base) MCG/ACT inhaler Inhale 2 puffs into the lungs every 6 (six) hours as needed for wheezing or shortness of breath.    Marland Kitchen amiodarone (PACERONE) 200 MG tablet Take 400 mg twice a day x 7 days then 200 mg twice a day (Patient taking differently: Take 200 mg by mouth 2 (two) times daily. ) 70 tablet 6  . apixaban (ELIQUIS) 5 MG TABS tablet Take 1 tablet (5 mg total) by mouth 2 (two) times daily. 60 tablet 11  . bisoprolol (ZEBETA) 5 MG tablet Take 1 tablet (5 mg total) by mouth daily. 30 tablet 5  .  furosemide (LASIX) 40 MG tablet TAKE 1 TABLET BY MOUTH DAILY, TAKE ADDITIONAL 1/2 TAB FOR EDEMA OR DYSPNEA 40 tablet 3  . levothyroxine (SYNTHROID, LEVOTHROID) 50 MCG tablet Take 1 tablet (50 mcg total) by mouth daily before breakfast. 30 tablet 6  . potassium chloride SA (K-DUR,KLOR-CON) 20 MEQ tablet Take 1 tablet (20 mEq total) by mouth daily. 30 tablet 5  . spironolactone (ALDACTONE) 25 MG tablet Take 1 tablet (25 mg total) by mouth daily. 30  tablet 6   No current facility-administered medications for this encounter.     Allergies:   Metoprolol tartrate; Biaxin [clarithromycin]; Levaquin [levofloxacin]; and Contrast media [iodinated diagnostic agents]   Social History:  The patient  reports that she has quit smoking. She has never used smokeless tobacco. She reports that she does not drink alcohol or use drugs.   Family History:  The patient's family history includes Atrial fibrillation in her brother; Heart disease in her father.   ROS:  Please see the history of present illness.   All other systems are personally reviewed and negative.   Exam:  [facility] Call; Exam is subjective and or/visual.) General:  No resp difficulty. Neck: non tender Cor: regular  Lungs: Normal respiratory effort with conversation.  Abdomen: Non-distended. Pt denies tenderness with self palpation.  Extremities: Pt denies edema. Neuro: Alert & oriented x 3.   Recent Labs: 02/21/2018: ALT 23 04/30/2018: B Natriuretic Peptide 39.3 07/23/2018: Hemoglobin 12.6; Platelets 298 07/24/2018: TSH 15.121 07/25/2018: Magnesium 2.2 07/26/2018: BUN 18; Creatinine, Ser 1.26; Potassium 3.6; Sodium 138  Personally reviewed   Wt Readings from Last 3 Encounters:  07/26/18 119.2 kg  04/30/18 125.7 kg  04/02/18 125.1 kg      Other studies personally reviewed: Additional studies/ records that were reviewed today include:   ASSESSMENT AND PLAN:  1.  Chronic Systolic Heart Failure EF recovered on 07/2018 60-65%.  NYHA III chronically. Continue lasix 40 mg in the morning and add 20 mg in the evening which is what she is already doing. She was instructed to take an additional 20 mg lasix if her weight is 272 pounds or greater.  Continue current dose of bisoprolol and spironolactone.  Plan to check BMET next visit.     2. VT Arrest VT on 05/2017 and 07/2018 Continue amiodarone 200 mg twice a day. Plan to check TSH next OV.  Continue Life Vest . She received a new  vest a few weeks ago.  She will eventually need EP follow up.   3. PAF  Reported regular pulse.  Continue amio 200 mg twice a day.     COVID screen The patient does not have any symptoms that suggest any further testing/ screening at this time.  Social distancing reinforced today.  Recommended follow-up:  Follow up in 2 weeks for a televisit. Today I will refer to HF SW for assistance with food.   Relevant cardiac medications were reviewed at length with the patient today.   The patient does not have concerns regarding their medications at this time.   The following changes were made today: See above    Labs/ tests ordered today include: BMET and TSH in 8 weeks   Patient Risk: After full review of this patients clinical status, I feel that she is at moderate risk for cardiac decompensation at this time.  Today, I have spent  25 minutes with the patient with telehealth technology discussing   Signed, [name], NP  08/14/2018 10:31 AM  [facility] [place] [facility] [place] [address] [phone] (office) [phone] (fax)

## 2018-08-14 NOTE — Addendum Note (Signed)
Encounter addended by: Nicole Cella, RN on: 08/14/2018 11:14 AM  Actions taken: Diagnosis association updated, Pharmacy for encounter modified, Order list changed, Clinical Note Signed

## 2018-08-16 ENCOUNTER — Telehealth: Payer: Self-pay | Admitting: *Deleted

## 2018-08-16 NOTE — Telephone Encounter (Signed)
Called patient to let her know due to recent COVID19 CDC Protocols, we are not seeing patients in the office on Monday in [place]. She currently does not have access to MyChart or WebEx due to internet issues. She does understand if she does need to be seen, she will have to come to the [facility].  She may have a transportation problem to be seen in [facility] Monday. She is agreeable with a phone visit, as she did with Dr. Shirlee Latch 3/25.

## 2018-08-19 ENCOUNTER — Ambulatory Visit: Payer: Medicaid Other | Admitting: Cardiology

## 2018-08-19 ENCOUNTER — Telehealth (HOSPITAL_COMMUNITY): Payer: Self-pay | Admitting: Licensed Clinical Social Worker

## 2018-08-19 NOTE — Telephone Encounter (Signed)
CSW referred by [name], NP. CSW contacted patient to inquire about food insecurity during this health crisis. Patient reported need and agreeable to weekly delivery from [employer]. Patient informed that delivery will be left on front door with no face to face contact with delivery person. Patient agreeable to plan and grateful for the assistance. Lasandra Beech, LCSW, CCSW-MCS [phone]

## 2018-08-26 ENCOUNTER — Telehealth (HOSPITAL_COMMUNITY): Payer: Self-pay | Admitting: Licensed Clinical Social Worker

## 2018-08-26 NOTE — Telephone Encounter (Signed)
CSW contacted patient to follow up on weekly food delivery package. Patient informed of delivery time and no face to face contact during delivery. Patient verbalizes understanding and grateful for the assistance.  CSW continues to follow for supportive needs. Jackie Renessa Wellnitz, LCSW, CCSW-MCS [phone]  

## 2018-08-28 ENCOUNTER — Telehealth (HOSPITAL_COMMUNITY): Payer: Self-pay | Admitting: Cardiology

## 2018-08-28 MED ORDER — AMIODARONE HCL 200 MG PO TABS: 200.0000 mg | ORAL_TABLET | Freq: Two times a day (BID) | ORAL | 11 refills | Status: DC

## 2018-08-28 NOTE — Telephone Encounter (Signed)
I cant see the records from [place]. Have her increase amio back to 200 mg BID and make sure she is wearing her LifeVest. Thanks

## 2018-08-28 NOTE — Telephone Encounter (Signed)
Patient left voicemail on triage line with concerns regarding increased HR, went to the ER @ [facility] 4/7 and was told to follow up with CHF clinic  Returned call to patient and patient reports the increased HR is still happening, feels mild chest tightness while trying to sleep. Denies active CP. No recent b/p readings. Reports she is only taking amiodarone 200 mg DAILY AS opposed to BID as prescribed.    Please advise further

## 2018-08-28 NOTE — Telephone Encounter (Signed)
Pt aware and voiced understanding 

## 2018-09-02 ENCOUNTER — Telehealth (HOSPITAL_COMMUNITY): Payer: Self-pay | Admitting: Licensed Clinical Social Worker

## 2018-09-02 NOTE — Telephone Encounter (Signed)
CSW contacted patient to follow up on weekly food delivery package. Patient informed of delivery time and no face to face contact during delivery. Message left as no answer.  CSW continues to follow for supportive needs. Jackie Davontay Watlington, LCSW, CCSW-MCS [phone] 

## 2018-09-09 ENCOUNTER — Telehealth (HOSPITAL_COMMUNITY): Payer: Self-pay | Admitting: Licensed Clinical Social Worker

## 2018-09-09 NOTE — Telephone Encounter (Signed)
CSW contacted patient to follow up on weekly food delivery package. Patient informed of delivery time and no face to face contact during delivery. Patient verbalizes understanding and grateful for the assistance.  CSW continues to follow for supportive needs. Jackie Birgit Nowling, LCSW, CCSW-MCS [phone]  

## 2018-09-11 ENCOUNTER — Ambulatory Visit (HOSPITAL_COMMUNITY)
Admission: RE | Admit: 2018-09-11 | Discharge: 2018-09-11 | Disposition: A | Discharge status: Home / Self Care | Payer: Medicaid Other | Source: Ambulatory Visit | Attending: Adult Health | Admitting: Adult Health

## 2018-09-11 ENCOUNTER — Encounter (HOSPITAL_COMMUNITY): Payer: Self-pay

## 2018-09-11 ENCOUNTER — Other Ambulatory Visit: Payer: Self-pay

## 2018-09-11 VITALS — HR 60 | Wt 265.0 lb

## 2018-09-11 DIAGNOSIS — I480 Paroxysmal atrial fibrillation: Secondary | ICD-10-CM

## 2018-09-11 DIAGNOSIS — I5042 Chronic combined systolic (congestive) and diastolic (congestive) heart failure: Secondary | ICD-10-CM | POA: Diagnosis not present

## 2018-09-11 DIAGNOSIS — I4901 Ventricular fibrillation: Secondary | ICD-10-CM

## 2018-09-11 MED ORDER — AMIODARONE HCL 200 MG PO TABS: 200.0000 mg | ORAL_TABLET | Freq: Every day | ORAL | 6 refills | Status: DC

## 2018-09-11 NOTE — Addendum Note (Signed)
Encounter addended by: Marisa Hua, RN on: 09/11/2018 11:55 AM  Actions taken: Clinical Note Signed, Pharmacy for encounter modified, Order list changed

## 2018-09-11 NOTE — Progress Notes (Signed)
Spoke with patient,discussed AVS ie medication changes, f/u appointments. Pt to receive call to schedule her appts. Verbalized understanding.

## 2018-09-11 NOTE — Progress Notes (Signed)
Heart Failure TeleHealth Note  Due to national recommendations of social distancing due to COVID 19, Audio/video telehealth visit is felt to be most appropriate for this patient at this time.  See MyChart message from today for patient consent regarding telehealth for Carrie Walters.  Date:  09/11/2018   ID:  [name], DOB 1956-01-29, MRN [id number]  Location: Home  Provider location:  Advanced Heart Failure Type of Visit: Established patient   PCP:  Dr [place]. Cardiologist:  Garwin Brothers, MD Primary HF: Dr Gala Romney  Chief Complaint: Heart Failure   History of Present Illness: Carrie Walters chronic atrial fibrillation, hypothyroidism, COPD, HTN, morbid obesity, asthma, VF arrest 02/21/18, and systolic heart failure due to NICM (diagnosed 02/2018).  Admitted 3/3 through 07/26/18 after life vest shock noted to be VT on interrogation. Loaded on IV amiodarone and she will continue po amiodarone. She was also treated for LLE cellulitis. She will continue Life Vest at home.Of note she had body lice that was treated during her hospitalization.   Since discharge from [facility] on 07/26/18 she has been evaluated at [facility] ED she was evaluated and sent home. On 08/12/18 she was evaluated by EMS with stable vital signs.   She presents via Web designer for a telehealth visit today.   Overall feeling fair. . She has had a sinus infection and went to [facility] ED 09/07/18 and she discharged to home the same day. She thinks she is starting to get over a sinus infection. Having intermittent dizziness after she takes her medications.  Denies PND/Orthopnea. Using 2 liters oxygen continuously.  Appetite ok. No fever or chills. Having some leg edema. Drinking lots of fluids. Weight at home 265 pounds.  Taking all medications but she has been taking amiodarone once a day because she was afraidf she was going to run out. She continues wear Zoll device. Uses RCAT.    she denies symptoms worrisome for COVID 19.   Past Medical History:  Diagnosis Date  . Anxiety   . Arthritis   . Asthma   . Atrial fibrillation (HCC)   . Cellulitis   . Dysrhythmia   . Hypertension   . Hypothyroidism    Past Surgical History:  Procedure Laterality Date  . CARDIOVERSION N/A 03/18/2018   Procedure: CARDIOVERSION;  Surgeon: [name], MD;  Location: [facility] ENDOSCOPY;  Service: Cardiovascular;  Laterality: N/A;  . CHOLECYSTECTOMY    . LEFT HEART CATH AND CORONARY ANGIOGRAPHY N/A 02/25/2018   Procedure: LEFT HEART CATH AND CORONARY ANGIOGRAPHY;  Surgeon: [name], MD;  Location: [facility] INVASIVE CV LAB;  Service: Cardiovascular;  Laterality: N/A;  . TEE WITHOUT CARDIOVERSION N/A 03/18/2018   Procedure: TRANSESOPHAGEAL ECHOCARDIOGRAM (TEE);  Surgeon: [name], MD;  Location: [facility] ENDOSCOPY;  Service: Cardiovascular;  Laterality: N/A;  . UMBILICAL HERNIA REPAIR       Current Outpatient Medications  Medication Sig Dispense Refill  . albuterol (PROVENTIL HFA;VENTOLIN HFA) 108 (90 Base) MCG/ACT inhaler Inhale 2 puffs into the lungs every 6 (six) hours as needed for wheezing or shortness of breath.    Marland Kitchen amiodarone (PACERONE) 200 MG tablet Take 1 tablet (200 mg total) by mouth 2 (two) times daily. (Patient taking differently: Take 200 mg by mouth daily. ) 60 tablet 11  . apixaban (ELIQUIS) 5 MG TABS tablet Take 1 tablet (5 mg total) by mouth 2 (two) times daily. 60 tablet 11  . bisoprolol (ZEBETA) 5 MG tablet Take 1 tablet (  5 mg total) by mouth daily. 30 tablet 5  . furosemide (LASIX) 40 MG tablet Take 1 tablet (40 mg total) by mouth every morning AND 0.5 tablets (20 mg total) every evening. TAKE ADDITIONAL 1/2 TAB FOR EDEMA OR DYSPNEA. 45 tablet 3  . levothyroxine (SYNTHROID, LEVOTHROID) 50 MCG tablet Take 1 tablet (50 mcg total) by mouth daily before breakfast. 30 tablet 6  . potassium chloride SA (K-DUR,KLOR-CON) 20 MEQ tablet Take 1 tablet (20 mEq total) by  mouth daily. 30 tablet 5  . spironolactone (ALDACTONE) 25 MG tablet Take 1 tablet (25 mg total) by mouth daily. 30 tablet 6   No current facility-administered medications for this encounter.     Allergies:   Metoprolol tartrate; Biaxin [clarithromycin]; Levaquin [levofloxacin]; and Contrast media [iodinated diagnostic agents]   Social History:  The patient  reports that she has quit smoking. She has never used smokeless tobacco. She reports that she does not drink alcohol or use drugs.   Family History:  The patient's family history includes Atrial fibrillation in her brother; Heart disease in her father.   ROS:  Please see the history of present illness.   All other systems are personally reviewed and negative.   Exam: Tele Health Call; Exam is subjective General:  Speaks in full sentences. No resp difficulty. Lungs: Normal respiratory effort with conversation.  Abdomen: Non-distended per patient report Extremities: Pt denies edema. Neuro: Alert & oriented x 3.   Recent Labs: 02/21/2018: ALT 23 04/30/2018: B Natriuretic Peptide 39.3 07/23/2018: Hemoglobin 12.6; Platelets 298 07/24/2018: TSH 15.121 07/25/2018: Magnesium 2.2 07/26/2018: BUN 18; Creatinine, Ser 1.26; Potassium 3.6; Sodium 138  Personally reviewed   Wt Readings from Last 3 Encounters:  09/11/18 120.2 kg (265 lb)  08/14/18 121.6 kg (268 lb)  07/26/18 119.2 kg (262 lb 12.6 oz)      ASSESSMENT AND PLAN:  1.  Chronic Systolic Heart Failure EF recovered on 07/2018 60-65%.  NYHA III chronically. Encouraged to move around in her home.  Continue lasix 40 mg in the morning and add 20 mg  She was instructed to take an additional 20 mg lasix if her weight is 272 pounds or greater and also  Continue current dose of bisoprolol and spironolactone.   2. VT Arrest VT on 05/2017 and 07/2018 -Cut back amiodarone 200 mg daily at bed time.  -Plan to check TSH next OV.  -Continue Life Vest . I will follow up Zoll   -She will  eventually need EP follow up.   3. PAF  Reported regular pulse.  Cut back amiodarone to 200 mg daily. Next OV she will need TSH, LFTs.  Continue apixaban 5 mg twice a day.    COVID screen The patient does not have any symptoms that suggest any further testing/ screening at this time.  Evaluated at [facility] 09/07/18 and discharged to home the same day. Social distancing reinforced today.  Patient Risk: After full review of this patients clinical status, I feel that they are at moderate risk for cardiac decompensation at this time.  Relevant cardiac medications were reviewed at length with the patient today. The patient does not have concerns regarding their medications at this time.   The following changes were made today:  See above.   Recommended follow-up:  Follow up 4 weeks for virtual visit.  Follow up 3-4 months with Dr Gala Romney  Today, I have spent 24  minutes with the patient with telehealth technology discussing the above issues.I personally called Alvino Chapel  with Zoll and she has not had any VT/VF. Marland Kitchen    Waneta Martins, NP  09/11/2018 11:36 AM  [facility] [address] Heart and Vascular [place] [address] [phone] (office) [phone] (fax)

## 2018-09-11 NOTE — Patient Instructions (Addendum)
DECREASE Amiodarone to 200 mg (1 tab) daily at bed time.  Follow up 4 weeks for virtual visit.    Follow up 3-4 months with Dr Gala Romney

## 2018-09-16 ENCOUNTER — Telehealth (HOSPITAL_COMMUNITY): Payer: Self-pay | Admitting: Licensed Clinical Social Worker

## 2018-09-16 NOTE — Telephone Encounter (Signed)
CSW contacted patient to follow up on weekly food delivery package. Patient informed of delivery time and no face to face contact during delivery. Patient verbalizes understanding and grateful for the assistance.  CSW continues to follow for supportive needs. Jackie Terik Haughey, LCSW, CCSW-MCS [phone]  

## 2018-09-23 ENCOUNTER — Telehealth (HOSPITAL_COMMUNITY): Payer: Self-pay | Admitting: Licensed Clinical Social Worker

## 2018-09-23 NOTE — Telephone Encounter (Signed)
CSW contacted patient to follow up on weekly food delivery package. Patient informed of delivery time and no face to face contact during delivery. Patient verbalizes understanding and grateful for the assistance.  CSW continues to follow for supportive needs. Jackie Aydeen Blume, LCSW, CCSW-MCS [phone]  

## 2018-09-24 ENCOUNTER — Other Ambulatory Visit: Payer: Self-pay | Admitting: Physician Assistant

## 2018-09-24 ENCOUNTER — Other Ambulatory Visit (HOSPITAL_COMMUNITY): Payer: Self-pay | Admitting: *Deleted

## 2018-09-24 MED ORDER — SPIRONOLACTONE 25 MG PO TABS: 25.0000 mg | ORAL_TABLET | Freq: Every day | ORAL | 6 refills | Status: DC

## 2018-09-30 ENCOUNTER — Telehealth (HOSPITAL_COMMUNITY): Payer: Self-pay | Admitting: Licensed Clinical Social Worker

## 2018-09-30 NOTE — Telephone Encounter (Signed)
CSW contacted patient to follow up on weekly food delivery package. Patient informed of delivery time and no face to face contact during delivery. Patient verbalizes understanding and grateful for the assistance.  CSW continues to follow for supportive needs. Jackie Aleira Deiter, LCSW, CCSW-MCS [phone]  

## 2018-10-07 ENCOUNTER — Other Ambulatory Visit: Payer: Self-pay

## 2018-10-07 ENCOUNTER — Telehealth (HOSPITAL_COMMUNITY): Payer: Self-pay | Admitting: Licensed Clinical Social Worker

## 2018-10-07 ENCOUNTER — Ambulatory Visit (HOSPITAL_COMMUNITY)
Admission: RE | Admit: 2018-10-07 | Discharge: 2018-10-07 | Disposition: A | Discharge status: Home / Self Care | Payer: Medicaid Other | Source: Ambulatory Visit | Attending: Adult Health | Admitting: Adult Health

## 2018-10-07 VITALS — Wt 265.0 lb

## 2018-10-07 DIAGNOSIS — I4901 Ventricular fibrillation: Secondary | ICD-10-CM

## 2018-10-07 DIAGNOSIS — R0602 Shortness of breath: Secondary | ICD-10-CM

## 2018-10-07 DIAGNOSIS — I5042 Chronic combined systolic (congestive) and diastolic (congestive) heart failure: Secondary | ICD-10-CM

## 2018-10-07 MED ORDER — FUROSEMIDE 40 MG PO TABS: 40.0000 mg | ORAL_TABLET | Freq: Two times a day (BID) | ORAL | 3 refills | Status: DC

## 2018-10-07 NOTE — Progress Notes (Signed)
Spoke to pt to review after visit summary. Pt verbalized understanding.

## 2018-10-07 NOTE — Progress Notes (Signed)
Heart Failure TeleHealth Note  Due to national recommendations of social distancing due to COVID 19, Audio/video telehealth visit is felt to be most appropriate for this patient at this time.  See MyChart message from today for patient consent regarding telehealth for [facility].  Date:  10/07/2018   ID:  Carrie Walters, DOB 11-29-1955, MRN [id number]  Location: Home  Provider location: [place] Advanced Heart Failure Type of Visit: Established patient   PCP:  [name], MD  Cardiologist:  Garwin Brothers, MD Primary HF: Dr Shirlee Latch   Chief Complaint: Heart Failure   History of Present Illness: Carrie Walters is a 63 y.o. female with a history of chronic atrial fibrillation, hypothyroidism, COPD, HTN, morbid obesity,asthma,VF arrest 02/21/18, and systolic heart failure due to NICM (diagnosed 02/2018).  Admitted 3/3 through 3/6/20after life vest shock noted to be VT on interrogation. Loaded on IV amiodarone and she will continue po amiodarone. She was also treated for LLE cellulitis. She will continue Life Vest at home.Of note she had body lice that was treated during her hospitalization.   Since discharge from [facility] on 07/26/18 she has been evaluated at [facility] ED she was evaluated and sent home. On 08/12/18 she was evaluated by EMS with stable vital signs.  She  presents via audio conferencing for a telehealth visit today due to increased shortness of breath. Says she completed an antibiotic course last week for a sinus infection. Tomorrow she has an appointment with GI doctor because she is having trouble swallowing and says food gets stuck.  Overall feeling fair. She remains SOB with exertion. Denies PND. + Orthopnea. Appetite ok. She has been eating a lot of tomato soup. No fever or chills. Weight at home 265 pounds.  She continues to wear the Life Vest. She no longer has open sores on her legs.  Taking all medications.   she denies symptoms worrisome for COVID  19.   Past Medical History:  Diagnosis Date  . Anxiety   . Arthritis   . Asthma   . Atrial fibrillation (HCC)   . Cellulitis   . Dysrhythmia   . Hypertension   . Hypothyroidism    Past Surgical History:  Procedure Laterality Date  . CARDIOVERSION N/A 03/18/2018   Procedure: CARDIOVERSION;  Surgeon: [name], MD;  Location: [facility] ENDOSCOPY;  Service: Cardiovascular;  Laterality: N/A;  . CHOLECYSTECTOMY    . LEFT HEART CATH AND CORONARY ANGIOGRAPHY N/A 02/25/2018   Procedure: LEFT HEART CATH AND CORONARY ANGIOGRAPHY;  Surgeon: [name], MD;  Location: [facility] INVASIVE CV LAB;  Service: Cardiovascular;  Laterality: N/A;  . TEE WITHOUT CARDIOVERSION N/A 03/18/2018   Procedure: TRANSESOPHAGEAL ECHOCARDIOGRAM (TEE);  Surgeon: [name], MD;  Location: [facility] ENDOSCOPY;  Service: Cardiovascular;  Laterality: N/A;  . UMBILICAL HERNIA REPAIR       Current Outpatient Medications  Medication Sig Dispense Refill  . albuterol (PROVENTIL HFA;VENTOLIN HFA) 108 (90 Base) MCG/ACT inhaler Inhale 2 puffs into the lungs every 6 (six) hours as needed for wheezing or shortness of breath.    Marland Kitchen amiodarone (PACERONE) 200 MG tablet Take 1 tablet (200 mg total) by mouth at bedtime. 30 tablet 6  . apixaban (ELIQUIS) 5 MG TABS tablet Take 1 tablet (5 mg total) by mouth 2 (two) times daily. 60 tablet 11  . bisoprolol (ZEBETA) 5 MG tablet Take 1 tablet (5 mg total) by mouth daily. 30 tablet 5  . furosemide (LASIX) 40 MG tablet  Take 1 tablet (40 mg total) by mouth every morning AND 0.5 tablets (20 mg total) every evening. TAKE ADDITIONAL 1/2 TAB FOR EDEMA OR DYSPNEA. 45 tablet 3  . levothyroxine (SYNTHROID, LEVOTHROID) 50 MCG tablet Take 1 tablet (50 mcg total) by mouth daily before breakfast. 30 tablet 6  . potassium chloride SA (K-DUR,KLOR-CON) 20 MEQ tablet Take 1 tablet (20 mEq total) by mouth daily. 30 tablet 5  . spironolactone (ALDACTONE) 25 MG tablet Take 1 tablet (25 mg total) by mouth daily.  30 tablet 6   No current facility-administered medications for this encounter.     Allergies:   Metoprolol tartrate; Biaxin [clarithromycin]; Levaquin [levofloxacin]; and Contrast media [iodinated diagnostic agents]   Social History:  The patient  reports that she has quit smoking. She has never used smokeless tobacco. She reports that she does not drink alcohol or use drugs.   Family History:  The patient's family history includes Atrial fibrillation in her brother; Heart disease in her father.   ROS:  Please see the history of present illness.   All other systems are personally reviewed and negative.   Exam:  tele Health Call; Exam is subjective  General:  Speaks in full sentences. No resp difficulty. Lungs: Normal respiratory effort with conversation.  Abdomen: Non-distended per patient report Extremities: She reports some leg edema. s edema. Neuro: Alert & oriented x 3.   Recent Labs: 02/21/2018: ALT 23 04/30/2018: B Natriuretic Peptide 39.3 07/23/2018: Hemoglobin 12.6; Platelets 298 07/24/2018: TSH 15.121 07/25/2018: Magnesium 2.2 07/26/2018: BUN 18; Creatinine, Ser 1.26; Potassium 3.6; Sodium 138  Personally reviewed   Wt Readings from Last 3 Encounters:  10/07/18 120.2 kg (265 lb)  09/11/18 120.2 kg (265 lb)  08/14/18 121.6 kg (268 lb)      ASSESSMENT AND PLAN: 1.Acute/Chronic Systolic Heart Failure EF recovered on 07/2018 60-65%.  NYHA III. Volume status trending up . Increase lasix 40 mg twice a day.  Continue current dose of bisoprolol and spironolactone.   2. VT Arrest VT on 05/2017 and 07/2018 -Continue amiodarone 200 mg daily at bed time.  -Plan to check TSH next OV.  -Continue Life Vest  And we will refer to Dr Elberta Fortis for a ICD for secondary prevention.  - No longer has wounds on her legs.   3. PAF  Reported regular pulse.  Continue amiodarone to 200 mg daily. Next OV she will need TSH, LFTs.  Continue apixaban 5 mg twice a day.   4. Difficulty  Swallowing: .  She has follow up with GI this week.    COVID screen The patient does not have any symptoms that suggest any further testing/ screening at this time.  Social distancing reinforced today.  Patient Risk: After full review of this patients clinical status, I feel that they are at moderate risk for cardiac decompensation at this time.  Relevant cardiac medications were reviewed at length with the patient today. The patient does not have concerns regarding their medications at this time.   The following changes were made today:  Increase lasix 40 mg twice a day. Refer to EP for ICD for secondary prevention in the setting of VT arrest.    Recommended follow-up: Follow up 4 weeks with Dr Shirlee Latch.   Today, I have spent 20  minutes with the patient with telehealth technology discussing the above issues .    Waneta Martins, NP  10/07/2018 12:31 PM  [facility] [place] [address] Heart [facility] V[facility] Center  [place] [address] [phone] (office) [phone] (fax)  

## 2018-10-07 NOTE — Addendum Note (Signed)
Encounter addended by: Nicole Cella, RN on: 10/07/2018 2:45 PM  Actions taken: Pharmacy for encounter modified, Order list changed, Diagnosis association updated, Clinical Note Signed

## 2018-10-07 NOTE — Telephone Encounter (Signed)
CSW contacted patient to follow up on weekly food delivery package. Patient informed of delivery time and no face to face contact during delivery. Patient verbalizes understanding and grateful for the assistance.  CSW continues to follow for supportive needs. Jackie Virgin Zellers, LCSW, CCSW-MCS [phone]  

## 2018-10-07 NOTE — Patient Instructions (Signed)
INCREASE Furosemide to 40mg  (1 tab) two times daily  You have been referred to Dr. Elberta Fortis for ICD placement. That office will be in contact with you in order set up an appointment.  Please follow up with Dr. Shirlee Latch in 4 weeks.

## 2018-10-09 ENCOUNTER — Encounter (HOSPITAL_COMMUNITY): Payer: Self-pay | Admitting: Emergency Medicine

## 2018-10-09 ENCOUNTER — Other Ambulatory Visit: Payer: Self-pay

## 2018-10-09 ENCOUNTER — Emergency Department (HOSPITAL_COMMUNITY): Payer: Medicaid Other

## 2018-10-09 ENCOUNTER — Telehealth (HOSPITAL_COMMUNITY): Payer: Medicaid Other

## 2018-10-09 ENCOUNTER — Emergency Department (HOSPITAL_COMMUNITY)
Admission: EM | Admit: 2018-10-09 | Discharge: 2018-10-09 | Disposition: A | Discharge status: Home / Self Care | Payer: Medicaid Other | Attending: Emergency Medicine | Admitting: Emergency Medicine

## 2018-10-09 DIAGNOSIS — I110 Hypertensive heart disease with heart failure: Secondary | ICD-10-CM | POA: Insufficient documentation

## 2018-10-09 DIAGNOSIS — Z79899 Other long term (current) drug therapy: Secondary | ICD-10-CM | POA: Diagnosis not present

## 2018-10-09 DIAGNOSIS — Z87891 Personal history of nicotine dependence: Secondary | ICD-10-CM | POA: Insufficient documentation

## 2018-10-09 DIAGNOSIS — I509 Heart failure, unspecified: Secondary | ICD-10-CM | POA: Insufficient documentation

## 2018-10-09 DIAGNOSIS — J45909 Unspecified asthma, uncomplicated: Secondary | ICD-10-CM | POA: Diagnosis not present

## 2018-10-09 DIAGNOSIS — E039 Hypothyroidism, unspecified: Secondary | ICD-10-CM | POA: Insufficient documentation

## 2018-10-09 DIAGNOSIS — R0602 Shortness of breath: Secondary | ICD-10-CM | POA: Diagnosis not present

## 2018-10-09 DIAGNOSIS — R1010 Upper abdominal pain, unspecified: Secondary | ICD-10-CM | POA: Insufficient documentation

## 2018-10-09 DIAGNOSIS — Z7901 Long term (current) use of anticoagulants: Secondary | ICD-10-CM | POA: Diagnosis not present

## 2018-10-09 LAB — BRAIN NATRIURETIC PEPTIDE: B Natriuretic Peptide: 27.9 pg/mL (ref 0.0–100.0)

## 2018-10-09 LAB — CBC WITH DIFFERENTIAL/PLATELET
Abs Immature Granulocytes: 0.03 10*3/uL (ref 0.00–0.07)
Basophils Absolute: 0.1 10*3/uL (ref 0.0–0.1)
Basophils Relative: 1 %
Eosinophils Absolute: 0.2 10*3/uL (ref 0.0–0.5)
Eosinophils Relative: 2 %
HCT: 44.7 % (ref 36.0–46.0)
Hemoglobin: 14.4 g/dL (ref 12.0–15.0)
Immature Granulocytes: 0 %
Lymphocytes Relative: 22 %
Lymphs Abs: 2.1 10*3/uL (ref 0.7–4.0)
MCH: 29.9 pg (ref 26.0–34.0)
MCHC: 32.2 g/dL (ref 30.0–36.0)
MCV: 92.9 fL (ref 80.0–100.0)
Monocytes Absolute: 0.6 10*3/uL (ref 0.1–1.0)
Monocytes Relative: 7 %
Neutro Abs: 6.3 10*3/uL (ref 1.7–7.7)
Neutrophils Relative %: 68 %
Platelets: 260 10*3/uL (ref 150–400)
RBC: 4.81 MIL/uL (ref 3.87–5.11)
RDW: 13.2 % (ref 11.5–15.5)
WBC: 9.3 10*3/uL (ref 4.0–10.5)
nRBC: 0 % (ref 0.0–0.2)

## 2018-10-09 LAB — COMPREHENSIVE METABOLIC PANEL
ALT: 17 U/L (ref 0–44)
AST: 19 U/L (ref 15–41)
Albumin: 4.2 g/dL (ref 3.5–5.0)
Alkaline Phosphatase: 69 U/L (ref 38–126)
Anion gap: 11 (ref 5–15)
BUN: 9 mg/dL (ref 8–23)
CO2: 26 mmol/L (ref 22–32)
Calcium: 9.7 mg/dL (ref 8.9–10.3)
Chloride: 100 mmol/L (ref 98–111)
Creatinine, Ser: 1.24 mg/dL — ABNORMAL HIGH (ref 0.44–1.00)
GFR calc Af Amer: 54 mL/min — ABNORMAL LOW (ref >60)
GFR calc non Af Amer: 46 mL/min — ABNORMAL LOW (ref >60)
Glucose, Bld: 89 mg/dL (ref 70–99)
Potassium: 3.9 mmol/L (ref 3.5–5.1)
Sodium: 137 mmol/L (ref 135–145)
Total Bilirubin: 1.3 mg/dL — ABNORMAL HIGH (ref 0.3–1.2)
Total Protein: 7.5 g/dL (ref 6.5–8.1)

## 2018-10-09 LAB — TROPONIN I: Troponin I: <0.03 ng/mL (ref <0.03)

## 2018-10-09 LAB — LIPASE, BLOOD: Lipase: 29 U/L (ref 11–51)

## 2018-10-09 MED ORDER — ALBUTEROL SULFATE HFA 108 (90 BASE) MCG/ACT IN AERS
4.0000 | INHALATION_SPRAY | Freq: Once | RESPIRATORY_TRACT | Status: AC
  Administered 2018-10-09: 4 via RESPIRATORY_TRACT
  Filled 2018-10-09: qty 6.7

## 2018-10-09 MED ORDER — PANTOPRAZOLE SODIUM 40 MG PO TBEC: 40.0000 mg | DELAYED_RELEASE_TABLET | Freq: Every day | ORAL | 0 refills | Status: DC

## 2018-10-09 NOTE — Discharge Instructions (Addendum)
If you develop worsening, continued, or recurrent abdominal pain, uncontrolled vomiting, fever, chest or back pain, or any other new/concerning symptoms then return to the ER for evaluation.  

## 2018-10-09 NOTE — ED Provider Notes (Signed)
[facility] EMERGENCY DEPARTMENT Provider Note   CSN: [id number] Arrival date & time: 10/09/18  5883    History   Chief Complaint Chief Complaint  Patient presents with  . Abdominal Pain    HPI Carrie Walters is a 63 y.o. female.     HPI  63 year old female presents with upper abdominal pain.  She is been having this for about a year but is been progressively worsening.  It is mostly left upper quadrant though sometimes in her right upper abdomen.  At times it feels like something is "jumping" in her abdomen.  No vomiting.  She is been having diarrhea the last few days and states that going to the bathroom seems to make it worse.  She is been short of breath for about a month with some cough and drainage.  She states the dyspnea is most prominent with minimal exertion and sometimes at rest when talking.  Sometimes some chest tightness but this may be due to her asthma and she would like a breathing treatment.  No urinary symptoms.  States she went to [facility] but they only focused on the shortness of breath and did not address her abdominal symptoms.  Past Medical History:  Diagnosis Date  . Anxiety   . Arthritis   . Asthma   . Atrial fibrillation (HCC)   . Cellulitis   . Dysrhythmia   . Hypertension   . Hypothyroidism     Patient Active Problem List   Diagnosis Date Noted  . Acute on chronic systolic (congestive) heart failure (HCC) 07/23/2018  . CHF (congestive heart failure) (HCC) 04/02/2018  . Acute on chronic combined systolic and diastolic CHF (congestive heart failure) (HCC) 03/14/2018  . Diarrhea 03/14/2018  . Left upper quadrant pain 03/14/2018  . Cardiac arrest (HCC)   . Acute systolic CHF (congestive heart failure) (HCC)   . Pressure injury of skin 02/22/2018  . A-fib (HCC) 02/21/2018  . Atrial flutter (HCC) 12/27/2017  . Morbid obesity (HCC) 12/27/2017  . Hypothyroidism 12/21/2017    Past Surgical History:  Procedure Laterality  Date  . CARDIOVERSION N/A 03/18/2018   Procedure: CARDIOVERSION;  Surgeon: [name], MD;  Location: [facility] ENDOSCOPY;  Service: Cardiovascular;  Laterality: N/A;  . CHOLECYSTECTOMY    . LEFT HEART CATH AND CORONARY ANGIOGRAPHY N/A 02/25/2018   Procedure: LEFT HEART CATH AND CORONARY ANGIOGRAPHY;  Surgeon: [name], MD;  Location: [facility] INVASIVE CV LAB;  Service: Cardiovascular;  Laterality: N/A;  . TEE WITHOUT CARDIOVERSION N/A 03/18/2018   Procedure: TRANSESOPHAGEAL ECHOCARDIOGRAM (TEE);  Surgeon: [name], MD;  Location: [facility] ENDOSCOPY;  Service: Cardiovascular;  Laterality: N/A;  . UMBILICAL HERNIA REPAIR       OB History   No obstetric history on file.      Home Medications    Prior to Admission medications   Medication Sig Start Date End Date Taking? Authorizing Provider  albuterol (PROVENTIL HFA;VENTOLIN HFA) 108 (90 Base) MCG/ACT inhaler Inhale 2 puffs into the lungs every 6 (six) hours as needed for wheezing or shortness of breath.    [provider]  amiodarone (PACERONE) 200 MG tablet Take 1 tablet (200 mg total) by mouth at bedtime. 09/11/18   Clegg, Amy D, NP  apixaban (ELIQUIS) 5 MG TABS tablet Take 1 tablet (5 mg total) by mouth 2 (two) times daily. 04/02/18   [name], MD  bisoprolol (ZEBETA) 5 MG tablet Take 1 tablet (5 mg total) by mouth daily. 07/26/18  Clegg, Amy D, NP  furosemide (LASIX) 40 MG tablet Take 1 tablet (40 mg total) by mouth 2 (two) times daily. TAKE ADDITIONAL 1/2 TAB FOR EDEMA OR DYSPNEA 10/07/18   Clegg, Amy D, NP  levothyroxine (SYNTHROID, LEVOTHROID) 50 MCG tablet Take 1 tablet (50 mcg total) by mouth daily before breakfast. 07/27/18   Clegg, Amy D, NP  pantoprazole (PROTONIX) 40 MG tablet Take 1 tablet (40 mg total) by mouth daily. 10/09/18   Pricilla Loveless, MD  potassium chloride SA (K-DUR,KLOR-CON) 20 MEQ tablet Take 1 tablet (20 mEq total) by mouth daily. 04/22/18   [name], MD  spironolactone (ALDACTONE) 25 MG  tablet Take 1 tablet (25 mg total) by mouth daily. 09/24/18   [name] D, NP    Family History Family History  Problem Relation Age of Onset  . Heart disease Father   . Atrial fibrillation Brother     Social History Social History   Tobacco Use  . Smoking status: Former Games developer  . Smokeless tobacco: Never Used  Substance Use Topics  . Alcohol use: Never    Frequency: Never  . Drug use: Never     Allergies   Metoprolol tartrate; Biaxin [clarithromycin]; Levaquin [levofloxacin]; and Contrast media [iodinated diagnostic agents]   Review of Systems Review of Systems  Constitutional: Negative for fever.  Respiratory: Positive for cough, chest tightness and shortness of breath.   Cardiovascular: Positive for leg swelling (chronic, unchanged). Negative for chest pain.  Gastrointestinal: Positive for abdominal pain. Negative for vomiting.  Genitourinary: Negative for dysuria.  All other systems reviewed and are negative.    Physical Exam Updated Vital Signs BP 121/61 (BP Location: Left Arm)   Pulse (!) 58   Temp 97.8 F (36.6 C) (Oral)   Resp 19   Ht 5\' 6"  (1.676 m)   Wt 120.2 kg   SpO2 100%   BMI 42.77 kg/m   Physical Exam Vitals signs and nursing note reviewed.  Constitutional:      General: She is not in acute distress.    Appearance: She is well-developed. She is obese. She is not ill-appearing or diaphoretic.  HENT:     Head: Normocephalic and atraumatic.     Right Ear: External ear normal.     Left Ear: External ear normal.     Nose: Nose normal.  Eyes:     General:        Right eye: No discharge.        Left eye: No discharge.  Cardiovascular:     Rate and Rhythm: Normal rate and regular rhythm.     Heart sounds: Normal heart sounds.  Pulmonary:     Effort: Pulmonary effort is normal. No tachypnea, accessory muscle usage or respiratory distress.     Breath sounds: Normal breath sounds. No wheezing or rales.  Abdominal:     Palpations: Abdomen is  soft.     Tenderness: There is generalized abdominal tenderness (mild, hard to localize).  Musculoskeletal:     Comments: Chronic appearing BL lymphedema  Skin:    General: Skin is warm and dry.  Neurological:     Mental Status: She is alert.  Psychiatric:        Mood and Affect: Mood is not anxious.      ED Treatments / Results  Labs (all labs ordered are listed, but only abnormal results are displayed) Labs Reviewed  COMPREHENSIVE METABOLIC PANEL - Abnormal; Notable for the following components:  Result Value   Creatinine, Ser 1.24 (*)    Total Bilirubin 1.3 (*)    GFR calc non Af Amer 46 (*)    GFR calc Af Amer 54 (*)    All other components within normal limits  LIPASE, BLOOD  BRAIN NATRIURETIC PEPTIDE  TROPONIN I  CBC WITH DIFFERENTIAL/PLATELET    EKG EKG Interpretation  Date/Time:  Wednesday Oct 09 2018 19:37:38 EDT Ventricular Rate:  52 PR Interval:    QRS Duration: 127 QT Interval:  556 QTC Calculation: 518 R Axis:   12 Text Interpretation:  Sinus rhythm Nonspecific intraventricular conduction delay Borderline T abnormalities, diffuse leads T wave flattening similar to Jul 23 2018 Confirmed by Pricilla Loveless [id number]) on 10/09/2018 7:57:00 PM   Radiology Ct Abdomen Pelvis Wo Contrast  Result Date: 10/09/2018 CLINICAL DATA:  Left upper quadrant abdominal pain EXAM: CT ABDOMEN AND PELVIS WITHOUT CONTRAST TECHNIQUE: Multidetector CT imaging of the abdomen and pelvis was performed following the standard protocol without IV contrast. COMPARISON:  CT abdomen pelvis 03/15/2018 FINDINGS: LOWER CHEST: There is no basilar pleural or apical pericardial effusion. HEPATOBILIARY: The hepatic contours and density are normal. There is no intra- or extrahepatic biliary dilatation. Status post cholecystectomy. PANCREAS: The pancreatic parenchymal contours are normal and there is no ductal dilatation. There is no peripancreatic fluid collection. SPLEEN: Normal. ADRENALS/URINARY  TRACT: --Adrenal glands: Normal. --Right kidney/ureter: No hydronephrosis, nephroureterolithiasis, perinephric stranding or solid renal mass. --Left kidney/ureter: Nonobstructing renal calculi measure up to 3 mm. No hydronephrosis or renal mass. --Urinary bladder: Normal for degree of distention STOMACH/BOWEL: --Stomach/Duodenum: There is no hiatal hernia or other gastric abnormality. The duodenal course and caliber are normal. --Small bowel: No dilatation or inflammation. --Colon: No focal abnormality. --Appendix: Normal. VASCULAR/LYMPHATIC: There is aortic atherosclerosis without hemodynamically significant stenosis. No abdominal or pelvic lymphadenopathy. REPRODUCTIVE: Normal uterus and ovaries. MUSCULOSKELETAL. No bony spinal canal stenosis or focal osseous abnormality. OTHER: None. IMPRESSION: 1. No acute abdominal or pelvic abnormality. 2. Nonobstructive left nephrolithiasis. Electronically Signed   By: Deatra Robinson M.D.   On: 10/09/2018 20:34   Dg Chest 2 View  Result Date: 10/09/2018 CLINICAL DATA:  Cough and dyspnea EXAM: CHEST - 2 VIEW COMPARISON:  07/23/2018 FINDINGS: Mild cardiomegaly with small right pleural effusion. Right basilar atelectasis. Lungs are otherwise clear. IMPRESSION: Mild cardiomegaly with small right pleural effusion and basilar atelectasis. Electronically Signed   By: Deatra Robinson M.D.   On: 10/09/2018 20:51    Procedures Procedures (including critical care time)  Medications Ordered in ED Medications  albuterol (VENTOLIN HFA) 108 (90 Base) MCG/ACT inhaler 4 puff (4 puffs Inhalation Given 10/09/18 1944)     Initial Impression / Assessment and Plan / ED Course  I have reviewed the triage vital signs and the nursing notes.  Pertinent labs & imaging results that were available during my care of the patient were reviewed by me and considered in my medical decision making (see chart for details).        Unclear cause of the patient's chronic but worsening abdominal  pain.  I will treat her with Protonix for possible gastric source.  Otherwise, no perforation or obvious findings on CT.  Chest x-ray with possible pleural effusion and may be some atelectasis but no obvious pneumonia on chest x-ray or CT.  BNP is benign and I highly doubt she is in acute CHF.  Unclear why she is having this dyspnea on exertion but at this point she appears stable for outpatient work-up with  PCP.  Final Clinical Impressions(s) / ED Diagnoses   Final diagnoses:  Upper abdominal pain  Shortness of breath    ED Discharge Orders         Ordered    pantoprazole (PROTONIX) 40 MG tablet  Daily     10/09/18 2136           Pricilla Loveless, MD 10/09/18 2255

## 2018-10-09 NOTE — ED Triage Notes (Signed)
Pt arrives via [facility] from home with reports of LUQ abd pain x1 year, worsened in past 2 weeks. Pt reports having a swooshing noise in her head x2 months. Pt endorses weakness, diarrhea and some SOB. Per EMS, pt has been seen at [facility] and PCP sent her due to them not finding anything wrong. Pt has LifeVest on. EMS reports pt was not wearing O2 when they arrived and pt supposed to be on 2L at home.

## 2018-10-10 ENCOUNTER — Telehealth (HOSPITAL_COMMUNITY): Payer: Self-pay | Admitting: Licensed Clinical Social Worker

## 2018-10-10 NOTE — Telephone Encounter (Signed)
CSW contacted patient to follow up on weekly food delivery package. Patient informed of no face to face contact during delivery. Patient verbalizes understanding and grateful for the assistance.  CSW continues to follow for supportive needs. Jackie Joanie Duprey, LCSW, CCSW-MCS [phone] 

## 2018-10-15 ENCOUNTER — Telehealth (HOSPITAL_COMMUNITY): Payer: Self-pay

## 2018-10-15 NOTE — Telephone Encounter (Signed)
Pt left message stating she had a med question. When called, pt asked if ok to take omeprazole with other heart meds. Advised to consult with pharmacist for medication questions.  Pt verbalized understanding

## 2018-10-16 ENCOUNTER — Emergency Department (HOSPITAL_COMMUNITY)
Admission: EM | Admit: 2018-10-16 | Discharge: 2018-10-16 | Disposition: A | Discharge status: Home / Self Care | Payer: Medicaid Other | Attending: Emergency Medicine | Admitting: Emergency Medicine

## 2018-10-16 ENCOUNTER — Other Ambulatory Visit: Payer: Self-pay

## 2018-10-16 ENCOUNTER — Emergency Department (HOSPITAL_COMMUNITY): Payer: Medicaid Other

## 2018-10-16 ENCOUNTER — Encounter (HOSPITAL_COMMUNITY): Payer: Self-pay | Admitting: Emergency Medicine

## 2018-10-16 DIAGNOSIS — R079 Chest pain, unspecified: Secondary | ICD-10-CM | POA: Diagnosis present

## 2018-10-16 DIAGNOSIS — Z79899 Other long term (current) drug therapy: Secondary | ICD-10-CM | POA: Diagnosis not present

## 2018-10-16 DIAGNOSIS — I5042 Chronic combined systolic (congestive) and diastolic (congestive) heart failure: Secondary | ICD-10-CM | POA: Insufficient documentation

## 2018-10-16 DIAGNOSIS — Z20828 Contact with and (suspected) exposure to other viral communicable diseases: Secondary | ICD-10-CM | POA: Insufficient documentation

## 2018-10-16 DIAGNOSIS — R110 Nausea: Secondary | ICD-10-CM | POA: Diagnosis not present

## 2018-10-16 DIAGNOSIS — R072 Precordial pain: Secondary | ICD-10-CM | POA: Diagnosis not present

## 2018-10-16 DIAGNOSIS — R0602 Shortness of breath: Secondary | ICD-10-CM | POA: Diagnosis not present

## 2018-10-16 DIAGNOSIS — J45909 Unspecified asthma, uncomplicated: Secondary | ICD-10-CM | POA: Insufficient documentation

## 2018-10-16 DIAGNOSIS — Z87891 Personal history of nicotine dependence: Secondary | ICD-10-CM | POA: Insufficient documentation

## 2018-10-16 DIAGNOSIS — E039 Hypothyroidism, unspecified: Secondary | ICD-10-CM | POA: Diagnosis not present

## 2018-10-16 DIAGNOSIS — I110 Hypertensive heart disease with heart failure: Secondary | ICD-10-CM | POA: Insufficient documentation

## 2018-10-16 DIAGNOSIS — Z7901 Long term (current) use of anticoagulants: Secondary | ICD-10-CM | POA: Insufficient documentation

## 2018-10-16 LAB — COMPREHENSIVE METABOLIC PANEL
ALT: 15 U/L (ref 0–44)
AST: 16 U/L (ref 15–41)
Albumin: 4.1 g/dL (ref 3.5–5.0)
Alkaline Phosphatase: 63 U/L (ref 38–126)
Anion gap: 14 (ref 5–15)
BUN: <5 mg/dL — ABNORMAL LOW (ref 8–23)
CO2: 24 mmol/L (ref 22–32)
Calcium: 9.8 mg/dL (ref 8.9–10.3)
Chloride: 103 mmol/L (ref 98–111)
Creatinine, Ser: 1.25 mg/dL — ABNORMAL HIGH (ref 0.44–1.00)
GFR calc Af Amer: 53 mL/min — ABNORMAL LOW (ref >60)
GFR calc non Af Amer: 46 mL/min — ABNORMAL LOW (ref >60)
Glucose, Bld: 99 mg/dL (ref 70–99)
Potassium: 3.7 mmol/L (ref 3.5–5.1)
Sodium: 141 mmol/L (ref 135–145)
Total Bilirubin: 1 mg/dL (ref 0.3–1.2)
Total Protein: 7 g/dL (ref 6.5–8.1)

## 2018-10-16 LAB — CBC WITH DIFFERENTIAL/PLATELET
Abs Immature Granulocytes: 0.04 10*3/uL (ref 0.00–0.07)
Basophils Absolute: 0.1 10*3/uL (ref 0.0–0.1)
Basophils Relative: 1 %
Eosinophils Absolute: 0.2 10*3/uL (ref 0.0–0.5)
Eosinophils Relative: 3 %
HCT: 43.2 % (ref 36.0–46.0)
Hemoglobin: 13.7 g/dL (ref 12.0–15.0)
Immature Granulocytes: 1 %
Lymphocytes Relative: 31 %
Lymphs Abs: 2.7 10*3/uL (ref 0.7–4.0)
MCH: 29.7 pg (ref 26.0–34.0)
MCHC: 31.7 g/dL (ref 30.0–36.0)
MCV: 93.5 fL (ref 80.0–100.0)
Monocytes Absolute: 0.6 10*3/uL (ref 0.1–1.0)
Monocytes Relative: 7 %
Neutro Abs: 5 10*3/uL (ref 1.7–7.7)
Neutrophils Relative %: 57 %
Platelets: 250 10*3/uL (ref 150–400)
RBC: 4.62 MIL/uL (ref 3.87–5.11)
RDW: 13.4 % (ref 11.5–15.5)
WBC: 8.6 10*3/uL (ref 4.0–10.5)
nRBC: 0 % (ref 0.0–0.2)

## 2018-10-16 LAB — TROPONIN I: Troponin I: <0.03 ng/mL (ref <0.03)

## 2018-10-16 LAB — SARS CORONAVIRUS 2 BY RT PCR (HOSPITAL ORDER, PERFORMED IN ~~LOC~~ HOSPITAL LAB): SARS Coronavirus 2: NEGATIVE

## 2018-10-16 LAB — LIPASE, BLOOD: Lipase: 29 U/L (ref 11–51)

## 2018-10-16 MED ORDER — LORAZEPAM 1 MG PO TABS
0.5000 mg | ORAL_TABLET | Freq: Once | ORAL | Status: AC
  Administered 2018-10-16: 0.5 mg via ORAL
  Filled 2018-10-16: qty 1

## 2018-10-16 MED ORDER — ALBUTEROL SULFATE HFA 108 (90 BASE) MCG/ACT IN AERS
2.0000 | INHALATION_SPRAY | Freq: Once | RESPIRATORY_TRACT | Status: AC
  Administered 2018-10-16: 2 via RESPIRATORY_TRACT
  Filled 2018-10-16: qty 6.7

## 2018-10-16 MED ORDER — LORAZEPAM 1 MG PO TABS: 1.0000 mg | ORAL_TABLET | Freq: Three times a day (TID) | ORAL | 0 refills | Status: DC | PRN

## 2018-10-16 NOTE — ED Triage Notes (Addendum)
  Patient BIB EMS for chest pain and SOB that started about 2 hours ago.  Patient states it was L sided and was sharp shooting down into her abdomen.  Patient states pain was 8/10, and is currently 3/10.  Patient has hx of CHF, COPD, and wears an external defibrillator while she is waiting for surgical placement.  Patient states she is nauseous but has not vomited.  Patient was given 324 ASA by EMS but no nitro.  Patient states her fluid level seems higher than usual based on her bilateral leg swelling.  Patient is A&O x4.

## 2018-10-16 NOTE — Discharge Instructions (Addendum)

## 2018-10-16 NOTE — ED Notes (Signed)
Purewick placed for urine collection.

## 2018-10-16 NOTE — ED Provider Notes (Signed)
[facility] EMERGENCY DEPARTMENT Provider Note   CSN: [id number] Arrival date & time: 10/16/18  0157    History   Chief Complaint Chief Complaint  Patient presents with  . Chest Pain    HPI Carrie Walters is a 63 y.o. female.     The history is provided by the patient.  Chest Pain  Pain location:  L chest Pain quality: sharp and shooting   Radiates to: abdomen. Pain severity:  Moderate Timing:  Intermittent Progression:  Improving Chronicity:  New Worsened by:  Nothing Ineffective treatments:  None tried Associated symptoms: nausea and shortness of breath   Associated symptoms: no fever and no vomiting   Patient with history of atrial fibrillation, obesity, nonischemic cardiomyopathy, history of VT, presents with chest pain.  She reports onset of sharp shooting chest pain in her left chest that went into her abdomen.  No fevers or vomiting. She wears a LifeVest, and is awaiting an ICD. She also reports increased fluid in her legs.  Past Medical History:  Diagnosis Date  . Anxiety   . Arthritis   . Asthma   . Atrial fibrillation (HCC)   . Cellulitis   . Dysrhythmia   . Hypertension   . Hypothyroidism     Patient Active Problem List   Diagnosis Date Noted  . Acute on chronic systolic (congestive) heart failure (HCC) 07/23/2018  . CHF (congestive heart failure) (HCC) 04/02/2018  . Acute on chronic combined systolic and diastolic CHF (congestive heart failure) (HCC) 03/14/2018  . Diarrhea 03/14/2018  . Left upper quadrant pain 03/14/2018  . Cardiac arrest (HCC)   . Acute systolic CHF (congestive heart failure) (HCC)   . Pressure injury of skin 02/22/2018  . A-fib (HCC) 02/21/2018  . Atrial flutter (HCC) 12/27/2017  . Morbid obesity (HCC) 12/27/2017  . Hypothyroidism 12/21/2017    Past Surgical History:  Procedure Laterality Date  . CARDIOVERSION N/A 03/18/2018   Procedure: CARDIOVERSION;  Surgeon: [name], MD;  Location: [facility]  ENDOSCOPY;  Service: Cardiovascular;  Laterality: N/A;  . CHOLECYSTECTOMY    . LEFT HEART CATH AND CORONARY ANGIOGRAPHY N/A 02/25/2018   Procedure: LEFT HEART CATH AND CORONARY ANGIOGRAPHY;  Surgeon: [name], MD;  Location: [facility] INVASIVE CV LAB;  Service: Cardiovascular;  Laterality: N/A;  . TEE WITHOUT CARDIOVERSION N/A 03/18/2018   Procedure: TRANSESOPHAGEAL ECHOCARDIOGRAM (TEE);  Surgeon: [name], MD;  Location: [facility] ENDOSCOPY;  Service: Cardiovascular;  Laterality: N/A;  . UMBILICAL HERNIA REPAIR       OB History   No obstetric history on file.      Home Medications    Prior to Admission medications   Medication Sig Start Date End Date Taking? Authorizing Provider  albuterol (PROVENTIL HFA;VENTOLIN HFA) 108 (90 Base) MCG/ACT inhaler Inhale 2 puffs into the lungs every 6 (six) hours as needed for wheezing or shortness of breath.    [provider]  amiodarone (PACERONE) 200 MG tablet Take 1 tablet (200 mg total) by mouth at bedtime. 09/11/18   Clegg, Amy D, NP  apixaban (ELIQUIS) 5 MG TABS tablet Take 1 tablet (5 mg total) by mouth 2 (two) times daily. 04/02/18   [name], MD  bisoprolol (ZEBETA) 5 MG tablet Take 1 tablet (5 mg total) by mouth daily. 07/26/18   Clegg, Amy D, NP  furosemide (LASIX) 40 MG tablet Take 1 tablet (40 mg total) by mouth 2 (two) times daily. TAKE ADDITIONAL 1/2 TAB FOR EDEMA OR DYSPNEA 10/07/18  Clegg, Amy D, NP  levothyroxine (SYNTHROID, LEVOTHROID) 50 MCG tablet Take 1 tablet (50 mcg total) by mouth daily before breakfast. 07/27/18   Clegg, Amy D, NP  pantoprazole (PROTONIX) 40 MG tablet Take 1 tablet (40 mg total) by mouth daily. 10/09/18   Pricilla LovelessGoldston, Scott, MD  potassium chloride SA (K-DUR,KLOR-CON) 20 MEQ tablet Take 1 tablet (20 mEq total) by mouth daily. 04/22/18   Laurey MoraleMcLean, Dalton S, MD  spironolactone (ALDACTONE) 25 MG tablet Take 1 tablet (25 mg total) by mouth daily. 09/24/18   Tonye Becketlegg, Amy D, NP    Family History Family History   Problem Relation Age of Onset  . Heart disease Father   . Atrial fibrillation Brother     Social History Social History   Tobacco Use  . Smoking status: Former Games developermoker  . Smokeless tobacco: Never Used  Substance Use Topics  . Alcohol use: Never    Frequency: Never  . Drug use: Never     Allergies   Metoprolol tartrate; Biaxin [clarithromycin]; Levaquin [levofloxacin]; and Contrast media [iodinated diagnostic agents]   Review of Systems Review of Systems  Constitutional: Negative for fever.  Respiratory: Positive for shortness of breath.   Cardiovascular: Positive for chest pain.  Gastrointestinal: Positive for nausea. Negative for vomiting.  Neurological: Negative for syncope.  All other systems reviewed and are negative.    Physical Exam Updated Vital Signs BP 138/87 (BP Location: Right Arm)   Pulse 67   Temp 98.2 F (36.8 C) (Oral)   Resp 18   Ht 1.676 m (5\' 6" )   Wt 120 kg   SpO2 98%   BMI 42.70 kg/m   Physical Exam CONSTITUTIONAL: Chronically ill-appearing, wearing LifeVest HEAD: Normocephalic/atraumatic EYES: EOMI/PERRL ENMT: Mucous membranes moist NECK: supple no meningeal signs SPINE/BACK:entire spine nontender CV: S1/S2 noted, no murmurs/rubs/gallops noted LUNGS: Lungs are clear to auscultation bilaterally, no apparent distress ABDOMEN: soft, nontender, no rebound or guarding, bowel sounds noted throughout abdomen GU:no cva tenderness NEURO: Pt is awake/alert/appropriate, moves all extremitiesx4.  No facial droop.   EXTREMITIES: pulses normal/equal, full ROM, chronic edema noted lower extremities SKIN: warm, color normal PSYCH: Mildly anxious  ED Treatments / Results  Labs (all labs ordered are listed, but only abnormal results are displayed) Labs Reviewed  COMPREHENSIVE METABOLIC PANEL - Abnormal; Notable for the following components:      Result Value   BUN <5 (*)    Creatinine, Ser 1.25 (*)    GFR calc non Af Amer 46 (*)    GFR calc Af  Amer 53 (*)    All other components within normal limits  SARS CORONAVIRUS 2 (HOSPITAL ORDER, PERFORMED IN [facility])  CBC WITH DIFFERENTIAL/PLATELET  LIPASE, BLOOD  TROPONIN I    EKG EKG Interpretation  Date/Time:  Wednesday Oct 16 2018 02:05:00 EDT Ventricular Rate:  63 PR Interval:    QRS Duration: 107 QT Interval:  616 QTC Calculation: 631 R Axis:   16 Text Interpretation:  Sinus rhythm Borderline T abnormalities, inferior leads Interpretation limited secondary to artifact Confirmed by Zadie RhineWickline, Dakarri Kessinger ([id number]) on 10/16/2018 2:13:54 AM   Radiology Dg Chest Port 1 View  Result Date: 10/16/2018 CLINICAL DATA:  Chest pain and shortness of breath for 2 hours EXAM: PORTABLE CHEST 1 VIEW COMPARISON:  10/09/2018 FINDINGS: Cardiac shadow is stable. Aortic calcifications are seen. The lungs are well aerated bilaterally. Minimal right basilar atelectasis is again seen and stable. No new focal abnormality is noted. IMPRESSION: Stable right basilar atelectasis. Electronically  Signed   By: Alcide Clever M.D.   On: 10/16/2018 02:41    Procedures Procedures   Medications Ordered in ED Medications  LORazepam (ATIVAN) tablet 0.5 mg (0.5 mg Oral Given 10/16/18 0530)  albuterol (VENTOLIN HFA) 108 (90 Base) MCG/ACT inhaler 2 puff (2 puffs Inhalation Given 10/16/18 0550)     Initial Impression / Assessment and Plan / ED Course  I have reviewed the triage vital signs and the nursing notes.  Pertinent labs & imaging results that were available during my care of the patient were reviewed by me and considered in my medical decision making (see chart for details).        2:39 AM Patient with his extensive history including cardiac arrest, with clean coronaries on cardiac cath last year.  She is followed closely by cardiology and is currently wearing a LifeVest.  Patient with nonischemic cardiomyopathy  She presents with chest pain that radiates in her abdomen.  She has no focal  abdominal tenderness. She was seen in the emergency department approximately 1 week ago and had a negative CT abdomen pelvis   Work up has been initiated.  Patient reports overall pain is improving and will defer meds at this time  4:29 AM Pt improved Watching TV, no distress Denies CP No recent syncope, no life vest shocks Pt reports she may be having anxiety attacks She agrees to try ativan Will also order home health after discussion with pt 6:10 AM Patient continues to improve.  No acute distress.  No hypoxia. She feels comfortable for discharge home. Ativan has been ordered for patient.  Lorain Zens was evaluated in Emergency Department on 10/16/2018 for the symptoms described in the history of present illness. She was evaluated in the context of the global COVID-19 pandemic, which necessitated consideration that the patient might be at risk for infection with the SARS-CoV-2 virus that causes COVID-19. Institutional protocols and algorithms that pertain to the evaluation of patients at risk for COVID-19 are in a state of rapid change based on information released by regulatory bodies including the CDC and federal and state organizations. These policies and algorithms were followed during the patient's care in the ED.  Final Clinical Impressions(s) / ED Diagnoses   Final diagnoses:  Precordial pain  Shortness of breath    ED Discharge Orders         Ordered    LORazepam (ATIVAN) 1 MG tablet  Every 8 hours PRN     10/16/18 9476           Zadie Rhine, MD 10/16/18 [phone]

## 2018-10-16 NOTE — ED Notes (Signed)
Called ptar at 6:37

## 2018-10-17 ENCOUNTER — Other Ambulatory Visit (HOSPITAL_COMMUNITY): Payer: Self-pay | Admitting: Cardiology

## 2018-10-18 ENCOUNTER — Telehealth: Payer: Self-pay | Admitting: *Deleted

## 2018-10-18 NOTE — Telephone Encounter (Signed)
Virtual Visit Pre-Appointment Phone Call  "(Name), I am calling you today to discuss your upcoming appointment. We are currently trying to limit exposure to the virus that causes COVID-19 by seeing patients at home rather than in the office."  1. "What is the BEST phone number to call the day of the visit?" - include this in appointment notes  2. "Do you have or have access to (through a family member/friend) a smartphone with video capability that we can use for your visit?" a. If yes - list this number in appt notes as "cell" (if different from BEST phone #) and list the appointment type as a VIDEO visit in appointment notes b. If no - list the appointment type as a PHONE visit in appointment notes  3. Confirm consent - "In the setting of the current Covid19 crisis, you are scheduled for a (phone or video) visit with your provider on (date) at (time).  Just as we do with many in-office visits, in order for you to participate in this visit, we must obtain consent.  If you'd like, I can send this to your mychart (if signed up) or email for you to review.  Otherwise, I can obtain your verbal consent now.  All virtual visits are billed to your insurance company just like a normal visit would be.  By agreeing to a virtual visit, we'd like you to understand that the technology does not allow for your provider to perform an examination, and thus may limit your provider's ability to fully assess your condition. If your provider identifies any concerns that need to be evaluated in person, we will make arrangements to do so.  Finally, though the technology is pretty good, we cannot assure that it will always work on either your or our end, and in the setting of a video visit, we may have to convert it to a phone-only visit.  In either situation, we cannot ensure that we have a secure connection.  Are you willing to proceed?" STAFF: Did the patient verbally acknowledge consent to telehealth visit? Document  YES/NO here: YES  4. Advise patient to be prepared - "Two hours prior to your appointment, go ahead and check your blood pressure, pulse, oxygen saturation, and your weight (if you have the equipment to check those) and write them all down. When your visit starts, your provider will ask you for this information. If you have an Apple Watch or Kardia device, please plan to have heart rate information ready on the day of your appointment. Please have a pen and paper handy nearby the day of the visit as well."  5. Give patient instructions for MyChart download to smartphone OR Doximity/Doxy.me as below if video visit (depending on what platform provider is using)  6. Inform patient they will receive a phone call 15 minutes prior to their appointment time (may be from unknown caller ID) so they should be prepared to answer    TELEPHONE CALL NOTE  Carrie Walters has been deemed a candidate for a follow-up tele-health visit to limit community exposure during the Covid-19 pandemic. I spoke with the patient via phone to ensure availability of phone/video source, confirm preferred email & phone number, and discuss instructions and expectations.  I reminded Carrie Walters to be prepared with any vital sign and/or heart rhythm information that could potentially be obtained via home monitoring, at the time of her visit. I reminded Carrie Walters to expect a phone call prior to her visit.  Carrie Walters 10/18/2018 3:15 PM   INSTRUCTIONS FOR DOWNLOADING THE MYCHART APP TO SMARTPHONE  - The patient must first make sure to have activated MyChart and know their login information - If Apple, go to [employer] and type in MyChart in the search bar and download the app. If Android, ask patient to go to [employer] and type in [place] in the search bar and download the app. The app is free but as with any other app downloads, their phone may require them to verify saved payment information or Apple/Android password.   - The patient will need to then log into the app with their MyChart username and password, and select [facility] as their healthcare provider to link the account. When it is time for your visit, go to the MyChart app, find appointments, and click Begin Video Visit. Be sure to Select Allow for your device to access the Microphone and Camera for your visit. You will then be connected, and your provider will be with you shortly.  **If they have any issues connecting, or need assistance please contact MyChart service desk [phone])**  **If using a computer, in order to ensure the best quality for their visit they will need to use either of the following Internet Browsers: [employer] or Google Chrome**  IF USING DOXIMITY or DOXY.ME - The patient will receive a link just prior to their visit by text.     FULL LENGTH CONSENT FOR TELE-HEALTH VISIT   I hereby voluntarily request, consent and authorize [facility] and its employed or contracted physicians, physician assistants, nurse practitioners or other licensed health care professionals (the Practitioner), to provide me with telemedicine health care services (the "Services") as deemed necessary by the treating Practitioner. I acknowledge and consent to receive the Services by the Practitioner via telemedicine. I understand that the telemedicine visit will involve communicating with the Practitioner through live audiovisual communication technology and the disclosure of certain medical information by electronic transmission. I acknowledge that I have been given the opportunity to request an in-person assessment or other available alternative prior to the telemedicine visit and am voluntarily participating in the telemedicine visit.  I understand that I have the right to withhold or withdraw my consent to the use of telemedicine in the course of my care at any time, without affecting my right to future care or treatment, and that  the Practitioner or I may terminate the telemedicine visit at any time. I understand that I have the right to inspect all information obtained and/or recorded in the course of the telemedicine visit and may receive copies of available information for a reasonable fee.  I understand that some of the potential risks of receiving the Services via telemedicine include:  Marland Kitchen Delay or interruption in medical evaluation due to technological equipment failure or disruption; . Information transmitted may not be sufficient (e.g. poor resolution of images) to allow for appropriate medical decision making by the Practitioner; and/or  . In rare instances, security protocols could fail, causing a breach of personal health information.  Furthermore, I acknowledge that it is my responsibility to provide information about my medical history, conditions and care that is complete and accurate to the best of my ability. I acknowledge that Practitioner's advice, recommendations, and/or decision may be based on factors not within their control, such as incomplete or inaccurate data provided by me or distortions of diagnostic images or specimens that may result from electronic transmissions. I understand that the practice  of medicine is not an Chief Strategy Officer and that Practitioner makes no warranties or guarantees regarding treatment outcomes. I acknowledge that I will receive a copy of this consent concurrently upon execution via email to the email address I last provided but may also request a printed copy by calling the office of [facility].    I understand that my insurance will be billed for this visit.   I have read or had this consent read to me. . I understand the contents of this consent, which adequately explains the benefits and risks of the Services being provided via telemedicine.  . I have been provided ample opportunity to ask questions regarding this consent and the Services and have had my questions answered to  my satisfaction. . I give my informed consent for the services to be provided through the use of telemedicine in my medical care  By participating in this telemedicine visit I agree to the above.

## 2018-10-19 ENCOUNTER — Emergency Department (HOSPITAL_COMMUNITY)
Admission: EM | Admit: 2018-10-19 | Discharge: 2018-10-20 | Disposition: A | Discharge status: Home / Self Care | Payer: Medicaid Other | Attending: Emergency Medicine | Admitting: Emergency Medicine

## 2018-10-19 ENCOUNTER — Other Ambulatory Visit: Payer: Self-pay

## 2018-10-19 ENCOUNTER — Encounter (HOSPITAL_COMMUNITY): Payer: Self-pay | Admitting: Emergency Medicine

## 2018-10-19 DIAGNOSIS — I5042 Chronic combined systolic (congestive) and diastolic (congestive) heart failure: Secondary | ICD-10-CM | POA: Diagnosis not present

## 2018-10-19 DIAGNOSIS — Z87891 Personal history of nicotine dependence: Secondary | ICD-10-CM | POA: Diagnosis not present

## 2018-10-19 DIAGNOSIS — E039 Hypothyroidism, unspecified: Secondary | ICD-10-CM | POA: Insufficient documentation

## 2018-10-19 DIAGNOSIS — I110 Hypertensive heart disease with heart failure: Secondary | ICD-10-CM | POA: Insufficient documentation

## 2018-10-19 DIAGNOSIS — Z79899 Other long term (current) drug therapy: Secondary | ICD-10-CM | POA: Insufficient documentation

## 2018-10-19 DIAGNOSIS — J45909 Unspecified asthma, uncomplicated: Secondary | ICD-10-CM | POA: Insufficient documentation

## 2018-10-19 DIAGNOSIS — R1010 Upper abdominal pain, unspecified: Secondary | ICD-10-CM | POA: Insufficient documentation

## 2018-10-19 DIAGNOSIS — R079 Chest pain, unspecified: Secondary | ICD-10-CM | POA: Insufficient documentation

## 2018-10-19 DIAGNOSIS — Z7901 Long term (current) use of anticoagulants: Secondary | ICD-10-CM | POA: Insufficient documentation

## 2018-10-19 DIAGNOSIS — M79602 Pain in left arm: Secondary | ICD-10-CM | POA: Diagnosis not present

## 2018-10-19 MED ORDER — ALBUTEROL SULFATE HFA 108 (90 BASE) MCG/ACT IN AERS
2.0000 | INHALATION_SPRAY | RESPIRATORY_TRACT | Status: DC | PRN
  Administered 2018-10-20: 2 via RESPIRATORY_TRACT
  Filled 2018-10-19: qty 6.7

## 2018-10-19 MED ORDER — AEROCHAMBER PLUS FLO-VU LARGE MISC: 1.0000 | Freq: Once | Status: DC

## 2018-10-19 NOTE — ED Provider Notes (Signed)
[facility] EMERGENCY DEPARTMENT Provider Note   CSN: [id number] Arrival date & time: 10/19/18  2316    History   Chief Complaint Chief Complaint  Patient presents with  . Chest Pain  . Abdominal Pain    HPI Carrie Walters is a 63 y.o. female with a hx of atrial fibrillation, obesity, nonischemic cardiomyopathy, history of VT presents to the Emergency Department complaining of gradual, persistent, progressively worsening left chest pain onset 2 hours PTA.  She reports sharp shooting chest pain in her left chest that goes into her upper abdomen and left arm.  She reports mild shortness of breath. No fevers, cough, or vomiting.  She wears a LifeVest, and is awaiting an ICD 2/2 cardiac arrest in Oct 2019.  Patient reports her LifeVest fired once in March and she was admitted after this.  She has had no firing since then including today.  Patient reports no increased swelling in her legs or increased redness. No specific aggravating of alleviating factors.  Pt given  ASA by EMS.    Record review shows that patient has been evaluated for chest pain and abdominal pain several times in the last few weeks.  She was seen here in the [facility] system on 5/20 for epigastric abdominal pain with a negative CT scan.  She was seen on 5/25 for chest pain and epigastric pain with a negative work-up.  It appears that she has been seen at [facility] several times and was evaluated by heart care on 5/18.      The history is provided by the patient and medical records. No language interpreter was used.    Past Medical History:  Diagnosis Date  . Anxiety   . Arthritis   . Asthma   . Atrial fibrillation (HCC)   . Cellulitis   . Dysrhythmia   . Hypertension   . Hypothyroidism     Patient Active Problem List   Diagnosis Date Noted  . Acute on chronic systolic (congestive) heart failure (HCC) 07/23/2018  . CHF (congestive heart failure) (HCC) 04/02/2018  . Acute on chronic  combined systolic and diastolic CHF (congestive heart failure) (HCC) 03/14/2018  . Diarrhea 03/14/2018  . Left upper quadrant pain 03/14/2018  . Cardiac arrest (HCC)   . Acute systolic CHF (congestive heart failure) (HCC)   . Pressure injury of skin 02/22/2018  . A-fib (HCC) 02/21/2018  . Atrial flutter (HCC) 12/27/2017  . Morbid obesity (HCC) 12/27/2017  . Hypothyroidism 12/21/2017    Past Surgical History:  Procedure Laterality Date  . CARDIOVERSION N/A 03/18/2018   Procedure: CARDIOVERSION;  Surgeon: [name], MD;  Location: [facility] ENDOSCOPY;  Service: Cardiovascular;  Laterality: N/A;  . CHOLECYSTECTOMY    . LEFT HEART CATH AND CORONARY ANGIOGRAPHY N/A 02/25/2018   Procedure: LEFT HEART CATH AND CORONARY ANGIOGRAPHY;  Surgeon: [name], MD;  Location: [facility] INVASIVE CV LAB;  Service: Cardiovascular;  Laterality: N/A;  . TEE WITHOUT CARDIOVERSION N/A 03/18/2018   Procedure: TRANSESOPHAGEAL ECHOCARDIOGRAM (TEE);  Surgeon: [name], MD;  Location: [facility] ENDOSCOPY;  Service: Cardiovascular;  Laterality: N/A;  . UMBILICAL HERNIA REPAIR       OB History   No obstetric history on file.      Home Medications    Prior to Admission medications   Medication Sig Start Date End Date Taking? Authorizing Provider  albuterol (PROVENTIL HFA;VENTOLIN HFA) 108 (90 Base) MCG/ACT inhaler Inhale 2 puffs into the lungs every 6 (six) hours as needed  for wheezing or shortness of breath.   Yes [provider]  amiodarone (PACERONE) 200 MG tablet Take 1 tablet (200 mg total) by mouth at bedtime. 09/11/18  Yes Clegg, Amy D, NP  apixaban (ELIQUIS) 5 MG TABS tablet Take 1 tablet (5 mg total) by mouth 2 (two) times daily. 04/02/18  Yes [name], MD  bisoprolol (ZEBETA) 5 MG tablet Take 1 tablet (5 mg total) by mouth daily. 07/26/18  Yes Clegg, Amy D, NP  furosemide (LASIX) 40 MG tablet Take 1 tablet (40 mg total) by mouth 2 (two) times daily. TAKE ADDITIONAL 1/2 TAB FOR EDEMA  OR DYSPNEA 10/07/18  Yes Clegg, Amy D, NP  KLOR-CON M20 20 MEQ tablet TAKE 1 TABLET BY MOUTH EVERY DAY Patient taking differently: Take 20 mEq by mouth daily.  10/17/18  Yes [name], MD  levothyroxine (SYNTHROID, LEVOTHROID) 50 MCG tablet Take 1 tablet (50 mcg total) by mouth daily before breakfast. 07/27/18  Yes Clegg, Amy D, NP  LORazepam (ATIVAN) 1 MG tablet Take 1 tablet (1 mg total) by mouth every 8 (eight) hours as needed for anxiety. 10/16/18  Yes Zadie Rhine, MD  pantoprazole (PROTONIX) 40 MG tablet Take 1 tablet (40 mg total) by mouth daily. 10/09/18  Yes Pricilla Loveless, MD  spironolactone (ALDACTONE) 25 MG tablet Take 1 tablet (25 mg total) by mouth daily. 09/24/18  Yes Clegg, Amy D, NP    Family History Family History  Problem Relation Age of Onset  . Heart disease Father   . Atrial fibrillation Brother     Social History Social History   Tobacco Use  . Smoking status: Former Games developer  . Smokeless tobacco: Never Used  Substance Use Topics  . Alcohol use: Never    Frequency: Never  . Drug use: Never     Allergies   Metoprolol tartrate; Biaxin [clarithromycin]; Levaquin [levofloxacin]; and Contrast media [iodinated diagnostic agents]   Review of Systems Review of Systems  Constitutional: Negative for appetite change, diaphoresis, fatigue, fever and unexpected weight change.  HENT: Negative for mouth sores.   Eyes: Negative for visual disturbance.  Respiratory: Positive for shortness of breath. Negative for cough, chest tightness and wheezing.   Cardiovascular: Positive for chest pain.  Gastrointestinal: Positive for abdominal pain. Negative for constipation, diarrhea, nausea and vomiting.  Endocrine: Negative for polydipsia, polyphagia and polyuria.  Genitourinary: Negative for dysuria, frequency, hematuria and urgency.  Musculoskeletal: Positive for arthralgias (left arm). Negative for back pain and neck stiffness.  Skin: Negative for rash.   Allergic/Immunologic: Negative for immunocompromised state.  Neurological: Negative for syncope, light-headedness and headaches.  Hematological: Does not bruise/bleed easily.  Psychiatric/Behavioral: Negative for sleep disturbance. The patient is not nervous/anxious.      Physical Exam Updated Vital Signs BP 119/75 (BP Location: Right Arm)   Pulse 75   Temp 98.3 F (36.8 C) (Oral)   Resp (!) 21   SpO2 100%   Physical Exam Vitals signs and nursing note reviewed.  Constitutional:      General: She is not in acute distress.    Appearance: She is not diaphoretic.     Comments: Obese female No distress  HENT:     Head: Normocephalic.  Eyes:     General: No scleral icterus.    Conjunctiva/sclera: Conjunctivae normal.  Neck:     Musculoskeletal: Normal range of motion.  Cardiovascular:     Rate and Rhythm: Normal rate and regular rhythm.     Pulses: Normal pulses.  Radial pulses are 2+ on the right side and 2+ on the left side.  Pulmonary:     Effort: No tachypnea, accessory muscle usage, prolonged expiration, respiratory distress or retractions.     Breath sounds: No stridor.     Comments: Equal chest rise. No increased work of breathing. Chest:     Comments: Life vest in place Abdominal:     General: There is no distension.     Palpations: Abdomen is soft.     Tenderness: There is no abdominal tenderness. There is no guarding or rebound.  Musculoskeletal:     Right lower leg: She exhibits no tenderness. Edema present.     Left lower leg: She exhibits no tenderness. Edema present.     Comments: Moves all extremities equally and without difficulty.  Skin:    General: Skin is warm and dry.     Capillary Refill: Capillary refill takes less than 2 seconds.  Neurological:     Mental Status: She is alert.     GCS: GCS eye subscore is 4. GCS verbal subscore is 5. GCS motor subscore is 6.     Comments: Speech is clear and goal oriented.  Psychiatric:        Mood  and Affect: Mood normal.      ED Treatments / Results  Labs (all labs ordered are listed, but only abnormal results are displayed) Labs Reviewed  COMPREHENSIVE METABOLIC PANEL - Abnormal; Notable for the following components:      Result Value   Glucose, Bld 110 (*)    Creatinine, Ser 1.20 (*)    AST 14 (*)    GFR calc non Af Amer 48 (*)    GFR calc Af Amer 56 (*)    All other components within normal limits  CBC WITH DIFFERENTIAL/PLATELET  LIPASE, BLOOD  TROPONIN I  BRAIN NATRIURETIC PEPTIDE  TROPONIN I    EKG EKG Interpretation  Date/Time:  Saturday Oct 19 2018 23:28:22 EDT Ventricular Rate:  70 PR Interval:    QRS Duration: 110 QT Interval:  433 QTC Calculation: 468 R Axis:   32 Text Interpretation:  Sinus rhythm Nonspecific T abnormalities, lateral leads No significant change since last tracing Confirmed by Gilda Crease [phone]) on 10/19/2018 11:51:52 PM   Radiology Dg Chest 2 View  Result Date: 10/20/2018 CLINICAL DATA:  Chest pain short of breath EXAM: CHEST - 2 VIEW COMPARISON:  10/16/2018, 10/09/2018, 07/23/2018 FINDINGS: Stable atelectasis or scarring at the bases. No acute consolidation or effusion. Stable cardiomediastinal silhouette. No pneumothorax. IMPRESSION: Stable scarring or atelectasis at the bases. Electronically Signed   By: Jasmine Pang M.D.   On: 10/20/2018 00:38    Procedures Procedures (including critical care time)  Medications Ordered in ED Medications  albuterol (VENTOLIN HFA) 108 (90 Base) MCG/ACT inhaler 2 puff (2 puffs Inhalation Given 10/20/18 0001)  AeroChamber Plus Flo-Vu Large MISC 1 each (has no administration in time range)  alum & mag hydroxide-simeth (MAALOX/MYLANTA) 200-200-20 MG/5ML suspension 30 mL (has no administration in time range)  albuterol (VENTOLIN HFA) 108 (90 Base) MCG/ACT inhaler 2 puff (2 puffs Inhalation Given 10/20/18 0138)     Initial Impression / Assessment and Plan / ED Course  I have reviewed  the triage vital signs and the nursing notes.  Pertinent labs & imaging results that were available during my care of the patient were reviewed by me and considered in my medical decision making (see chart for details).  Patient presents to the emergency department with approximately 2 hours of chest pain.  It is improving by the time she arrived.  She has been evaluated for this several times in the last week.  Labs here today are reassuring including negative troponin.  Her EKG is unchanged from previous.  Abdomen is soft and nontender.  Patient is afebrile without tachycardia or hypotension.    She reports that sometimes when she feels like this her inhaler helps but she did not use it prior to arrival.  Patient has been given albuterol x2 via MDI and her chest pain has resolved.  She is not short of breath.  She has no respiratory distress or hypoxia.  This time I do not feel that her chest pain is related to acute coronary syndrome.  She will need very close follow-up with cardiology.  I have discussed this with her.  Patient states understanding and is in agreement with the plan.  She is to return to the emergency department for return of chest pain or firing of her LifeVest.  The patient was discussed with and seen by Dr. Blinda LeatherwoodPollina who agrees with the treatment plan.   Final Clinical Impressions(s) / ED Diagnoses   Final diagnoses:  Left-sided chest pain    ED Discharge Orders    None       Milta DeitersMuthersbaugh, Mahsa Hanser, PA-C 10/20/18 [id number]    Gilda CreasePollina, Christopher J, MD 10/20/18 [phone]

## 2018-10-19 NOTE — ED Triage Notes (Signed)
Pt presents with [facility] for sudden substernal CP x 2 hr and called 911, EMS gave 324mg  ASA, VS: 98% 2L [place], 142/74, 70 bpm/regular, 97.50F  Pt was here 3 days ago for same, LifeVest present, denies discharge or alarms, pt reports ongoing issues with her heart for 2 months, also c/o dizziness and hernia pain

## 2018-10-20 ENCOUNTER — Encounter: Payer: Self-pay | Admitting: Cardiology

## 2018-10-20 ENCOUNTER — Emergency Department (HOSPITAL_COMMUNITY): Payer: Medicaid Other

## 2018-10-20 LAB — COMPREHENSIVE METABOLIC PANEL
ALT: 16 U/L (ref 0–44)
AST: 14 U/L — ABNORMAL LOW (ref 15–41)
Albumin: 3.8 g/dL (ref 3.5–5.0)
Alkaline Phosphatase: 56 U/L (ref 38–126)
Anion gap: 11 (ref 5–15)
BUN: 13 mg/dL (ref 8–23)
CO2: 25 mmol/L (ref 22–32)
Calcium: 9.4 mg/dL (ref 8.9–10.3)
Chloride: 105 mmol/L (ref 98–111)
Creatinine, Ser: 1.2 mg/dL — ABNORMAL HIGH (ref 0.44–1.00)
GFR calc Af Amer: 56 mL/min — ABNORMAL LOW (ref >60)
GFR calc non Af Amer: 48 mL/min — ABNORMAL LOW (ref >60)
Glucose, Bld: 110 mg/dL — ABNORMAL HIGH (ref 70–99)
Potassium: 3.5 mmol/L (ref 3.5–5.1)
Sodium: 141 mmol/L (ref 135–145)
Total Bilirubin: 1.1 mg/dL (ref 0.3–1.2)
Total Protein: 6.6 g/dL (ref 6.5–8.1)

## 2018-10-20 LAB — CBC WITH DIFFERENTIAL/PLATELET
Abs Immature Granulocytes: 0.03 10*3/uL (ref 0.00–0.07)
Basophils Absolute: 0.1 10*3/uL (ref 0.0–0.1)
Basophils Relative: 1 %
Eosinophils Absolute: 0.2 10*3/uL (ref 0.0–0.5)
Eosinophils Relative: 2 %
HCT: 40.3 % (ref 36.0–46.0)
Hemoglobin: 13 g/dL (ref 12.0–15.0)
Immature Granulocytes: 0 %
Lymphocytes Relative: 31 %
Lymphs Abs: 2.7 10*3/uL (ref 0.7–4.0)
MCH: 29.9 pg (ref 26.0–34.0)
MCHC: 32.3 g/dL (ref 30.0–36.0)
MCV: 92.6 fL (ref 80.0–100.0)
Monocytes Absolute: 0.6 10*3/uL (ref 0.1–1.0)
Monocytes Relative: 6 %
Neutro Abs: 5.3 10*3/uL (ref 1.7–7.7)
Neutrophils Relative %: 60 %
Platelets: 250 10*3/uL (ref 150–400)
RBC: 4.35 MIL/uL (ref 3.87–5.11)
RDW: 13.2 % (ref 11.5–15.5)
WBC: 8.9 10*3/uL (ref 4.0–10.5)
nRBC: 0 % (ref 0.0–0.2)

## 2018-10-20 LAB — TROPONIN I
Troponin I: <0.03 ng/mL (ref <0.03)
Troponin I: <0.03 ng/mL (ref <0.03)

## 2018-10-20 LAB — BRAIN NATRIURETIC PEPTIDE: B Natriuretic Peptide: 15.7 pg/mL (ref 0.0–100.0)

## 2018-10-20 LAB — LIPASE, BLOOD: Lipase: 34 U/L (ref 11–51)

## 2018-10-20 MED ORDER — ALBUTEROL SULFATE HFA 108 (90 BASE) MCG/ACT IN AERS
2.0000 | INHALATION_SPRAY | Freq: Once | RESPIRATORY_TRACT | Status: AC
  Administered 2018-10-20: 2 via RESPIRATORY_TRACT

## 2018-10-20 MED ORDER — ALUM & MAG HYDROXIDE-SIMETH 200-200-20 MG/5ML PO SUSP
30.0000 mL | Freq: Once | ORAL | Status: DC
  Filled 2018-10-20: qty 30

## 2018-10-20 NOTE — Discharge Instructions (Addendum)
1. Medications: usual home medications 2. Treatment: rest, drink plenty of fluids,  3. Follow Up: Please followup with your primary doctor and cardiologist in 2 days for discussion of your diagnoses and further evaluation after today's visit; if you do not have a primary care doctor use the resource guide provided to find one; Please return to the ER for return of chest pain, shock from the life vest, worsening symptoms or other concerns.

## 2018-10-20 NOTE — ED Notes (Signed)
ptar called to transport the pt back home in [place]

## 2018-10-20 NOTE — ED Notes (Signed)
Waiting for ptar to come transport

## 2018-10-20 NOTE — ED Notes (Signed)
Pt c/o  Being sob and nasal 02 not high enough  02 sats 100% on 2 liters  Inhaler given with 2 puffs

## 2018-10-20 NOTE — ED Notes (Signed)
ptar here to take home 

## 2018-10-21 ENCOUNTER — Telehealth (HOSPITAL_COMMUNITY): Payer: Self-pay | Admitting: Licensed Clinical Social Worker

## 2018-10-21 ENCOUNTER — Other Ambulatory Visit: Payer: Self-pay

## 2018-10-21 ENCOUNTER — Telehealth (INDEPENDENT_AMBULATORY_CARE_PROVIDER_SITE_OTHER): Payer: Medicaid Other | Admitting: Cardiology

## 2018-10-21 DIAGNOSIS — I469 Cardiac arrest, cause unspecified: Secondary | ICD-10-CM

## 2018-10-21 NOTE — Progress Notes (Signed)
Virtual Visit via Telephone Note   This visit type was conducted due to national recommendations for restrictions regarding the COVID-19 Pandemic (e.g. social distancing) in an effort to limit this patient's exposure and mitigate transmission in our community.  Due to her co-morbid illnesses, this patient is at least at moderate risk for complications without adequate follow up.  This format is felt to be most appropriate for this patient at this time.  The patient did not have access to video technology/had technical difficulties with video requiring transitioning to audio format only (telephone).  All issues noted in this document were discussed and addressed.  No physical exam could be performed with this format.  Please refer to the patient's chart for her  consent to telehealth for [facility].   Date:  10/21/2018   ID:  Carrie Walters, DOB [birth year], MRN [id number]  Patient Location: Home Provider Location: Home  PCP:  [place], Sarah T, MD  Cardiologist:  Garwin Brothersajan R Revankar, MD [facility] Electrophysiologist:  None   Evaluation Performed:  Consultation - Carrie Walters was referred by Tonye BecketAmy Clegg for the evaluation of cardiac arrest.  Chief Complaint: VF arrest  History of Present Illness:    Carrie Walters is a 63 y.o. female with a history significant for chronic atrial fibrillation, hypothyroidism, COPD, hypertension, morbid obesity, asthma.  She is status post VF arrest 02/21/2018.  She has systolic heart failure with a nonischemic cardiomyopathy diagnosed 03/10/2018.  She was admitted 3/3 through 07/26/2018 after a LifeVest shock.  This was noted to be ventricular tachycardia based on interrogation.  She was also treated for a lower extremity cellulitis.  She also had body lice that was treated during her hospitalization.  Since her discharge, she is continued to wear the LifeVest.  She no longer has open sores on her legs.  She is taking all of her medications.  The patient also states that her  body lice have been treated.  She is having a swishing feeling in her head when she gets up and moves around.  She says that she can hear her heartbeat as well as getting dizzy.  She gets dizzy when she stands up as well as when she sits.  She does have a hiatal hernia and she thinks that this is causing her to have chest pain.  She has an appointment tomorrow to discuss this.  The patient does not have symptoms concerning for COVID-19 infection (fever, chills, cough, or new shortness of breath).    Past Medical History:  Diagnosis Date  . Anxiety   . Arthritis   . Asthma   . Atrial fibrillation (HCC)   . Cellulitis   . Dysrhythmia   . Hypertension   . Hypothyroidism    Past Surgical History:  Procedure Laterality Date  . CARDIOVERSION N/A 03/18/2018   Procedure: CARDIOVERSION;  Surgeon: Laurey MoraleMcLean, Dalton S, MD;  [facility];  Service: Cardiovascular;  Laterality: N/A;  . CHOLECYSTECTOMY    . LEFT HEART CATH AND CORONARY ANGIOGRAPHY N/A 02/25/2018   Procedure: LEFT HEART CATH AND CORONARY ANGIOGRAPHY;  Surgeon: Runell GessBerry, Jonathan J, MD;  Location: [facility] INVASIVE CV LAB;  Service: Cardiovascular;  Laterality: N/A;  . TEE WITHOUT CARDIOVERSION N/A 03/18/2018   Procedure: TRANSESOPHAGEAL ECHOCARDIOGRAM (TEE);  Surgeon: Laurey MoraleMcLean, Dalton S, MD;  [facility];  Service: Cardiovascular;  Laterality: N/A;  . UMBILICAL HERNIA REPAIR       Current Meds  Medication Sig  . albuterol (PROVENTIL HFA;VENTOLIN HFA) 108 (90 Base) MCG/ACT inhaler  Inhale 2 puffs into the lungs every 6 (six) hours as needed for wheezing or shortness of breath.  Marland Kitchen amiodarone (PACERONE) 200 MG tablet Take 1 tablet (200 mg total) by mouth at bedtime.  Marland Kitchen apixaban (ELIQUIS) 5 MG TABS tablet Take 1 tablet (5 mg total) by mouth 2 (two) times daily.  . bisoprolol (ZEBETA) 5 MG tablet Take 1 tablet (5 mg total) by mouth daily.  . furosemide (LASIX) 40 MG tablet Take 1 tablet (40 mg total) by mouth 2 (two) times daily.  TAKE ADDITIONAL 1/2 TAB FOR EDEMA OR DYSPNEA  . KLOR-CON M20 20 MEQ tablet TAKE 1 TABLET BY MOUTH EVERY DAY (Patient taking differently: Take 20 mEq by mouth daily. )  . levothyroxine (SYNTHROID, LEVOTHROID) 50 MCG tablet Take 1 tablet (50 mcg total) by mouth daily before breakfast.  . LORazepam (ATIVAN) 1 MG tablet Take 1 tablet (1 mg total) by mouth every 8 (eight) hours as needed for anxiety.  Marland Kitchen omeprazole (PRILOSEC) 40 MG capsule Take 40 mg by mouth daily.  Marland Kitchen spironolactone (ALDACTONE) 25 MG tablet Take 1 tablet (25 mg total) by mouth daily.     Allergies:   Metoprolol tartrate; Biaxin [clarithromycin]; Levaquin [levofloxacin]; and Contrast media [iodinated diagnostic agents]   Social History   Tobacco Use  . Smoking status: Former Games developer  . Smokeless tobacco: Never Used  Substance Use Topics  . Alcohol use: Never    Frequency: Never  . Drug use: Never     Family Hx: The patient's family history includes Atrial fibrillation in her brother; Heart disease in her father.  ROS:   Please see the history of present illness.     All other systems reviewed and are negative.   Prior CV studies:   The following studies were reviewed today:  TTE 07/25/2018  1. The left ventricle was not well visualized. The cavity size was normal. Left ventricular diastolic parameters were normal.  2. The right ventricle has normal systolic function. The cavity was normal. There is no increase in right ventricular wall thickness.  3. The mitral valve is normal in structure. There is mild mitral annular calcification present.  4. The tricuspid valve is normal in structure.  5. The aortic valve is tricuspid with moderate sclerosis.  6. The pulmonic valve was normal in structure.  7. The echo images are of poor quality and cannot give accurate assessment of LVF or wall motion. Patient needs limited study with definity contrast for wall motion assessment.   Labs/Other Tests and Data Reviewed:    EKG:   An ECG dated 10/19/2018 was personally reviewed today and demonstrated:  Sinus rhythm  Recent Labs: 07/24/2018: TSH 15.121 07/25/2018: Magnesium 2.2 10/19/2018: ALT 16; B Natriuretic Peptide 15.7; BUN 13; Creatinine, Ser 1.20; Hemoglobin 13.0; Platelets 250; Potassium 3.5; Sodium 141   Recent Lipid Panel No results found for: CHOL, TRIG, HDL, CHOLHDL, LDLCALC, LDLDIRECT  Wt Readings from Last 3 Encounters:  10/16/18 264 lb 8.8 oz (120 kg)  10/09/18 265 lb (120.2 kg)  10/07/18 265 lb (120.2 kg)     Objective:    Vital Signs:  There were no vitals taken for this visit.   GEN:  no acute distress PSYCH:  normal affect  ASSESSMENT & PLAN:    1. VT arrest: Occurred 06/10/2017 and 08/09/2018.  Currently on amiodarone with a LifeVest in place.  Fortunately she no longer has wounds on her legs.  She would thus qualify for ICD therapy.  Risks and benefits were  discussed and include bleeding, tamponade, infection, and pneumothorax.  She is agreed to the procedure.  Due to all of her chronic medical conditions, I would prefer to see her in person prior to scheduling this procedure.  We Keval Nam set her up once clinics are open. 2. Atrial fibrillation: Currently on amiodarone and Eliquis. This patients CHA2DS2-VASc Score and unadjusted Ischemic Stroke Rate (% per year) is equal to 2.2 % stroke rate/year from a score of 2  Above score calculated as 1 point each if present [CHF, HTN, DM, Vascular=MI/PAD/Aortic Plaque, Age if 65-74, or Female] Above score calculated as 2 points each if present [Age > 75, or Stroke/TIA/TE]  3. Acute on chronic systolic heart failure: EF is fortunately recovered.  NYHA class III.  Currently on bisoprolol and Aldactone as well as Lasix.  Plan per heart failure team.  COVID-19 Education: The signs and symptoms of COVID-19 were discussed with the patient and how to seek care for testing (follow up with PCP or arrange E-visit).  The importance of social distancing was discussed  today.  Time:   Today, I have spent 14 minutes with the patient with telehealth technology discussing the above problems.     Medication Adjustments/Labs and Tests Ordered: Current medicines are reviewed at length with the patient today.  Concerns regarding medicines are outlined above.   Tests Ordered: No orders of the defined types were placed in this encounter.   Medication Changes: No orders of the defined types were placed in this encounter. Case discussed with referring cardiologist  Disposition:  Follow up Once clinics open to discuss ICD therapy.  Signed, Crucita Lacorte Jorja Loa, MD  10/21/2018 10:39 AM    [facility]

## 2018-10-21 NOTE — Telephone Encounter (Signed)
CSW contacted patient to follow up on weekly food delivery package. Patient informed of no face to face contact during delivery. CSW discussed transition option for food delivery as the Covid 19 Food relief program will be ending on November 01, 2018. Patient verbalizes understanding and grateful for the assistance.  CSW continues to follow for supportive needs. Jackie Airi Copado, LCSW, CCSW-MCS [phone] 

## 2018-10-24 ENCOUNTER — Telehealth (HOSPITAL_COMMUNITY): Payer: Self-pay

## 2018-10-24 NOTE — Telephone Encounter (Signed)
Received VM that patient would like to speak with Nurse practitioner. Attempted to reach patient, LM on vm to return call to office

## 2018-10-25 ENCOUNTER — Emergency Department (HOSPITAL_COMMUNITY): Payer: Medicaid Other

## 2018-10-25 ENCOUNTER — Encounter (HOSPITAL_COMMUNITY): Payer: Self-pay

## 2018-10-25 ENCOUNTER — Emergency Department (HOSPITAL_COMMUNITY)
Admission: EM | Admit: 2018-10-25 | Discharge: 2018-10-25 | Disposition: A | Discharge status: Home / Self Care | Payer: Medicaid Other | Attending: Emergency Medicine | Admitting: Emergency Medicine

## 2018-10-25 ENCOUNTER — Other Ambulatory Visit: Payer: Self-pay

## 2018-10-25 DIAGNOSIS — R0602 Shortness of breath: Secondary | ICD-10-CM | POA: Insufficient documentation

## 2018-10-25 DIAGNOSIS — Z87891 Personal history of nicotine dependence: Secondary | ICD-10-CM | POA: Insufficient documentation

## 2018-10-25 DIAGNOSIS — Z8674 Personal history of sudden cardiac arrest: Secondary | ICD-10-CM | POA: Diagnosis not present

## 2018-10-25 DIAGNOSIS — Z20828 Contact with and (suspected) exposure to other viral communicable diseases: Secondary | ICD-10-CM | POA: Insufficient documentation

## 2018-10-25 DIAGNOSIS — I4891 Unspecified atrial fibrillation: Secondary | ICD-10-CM | POA: Insufficient documentation

## 2018-10-25 DIAGNOSIS — Z7901 Long term (current) use of anticoagulants: Secondary | ICD-10-CM | POA: Insufficient documentation

## 2018-10-25 DIAGNOSIS — I10 Essential (primary) hypertension: Secondary | ICD-10-CM | POA: Diagnosis not present

## 2018-10-25 DIAGNOSIS — M25512 Pain in left shoulder: Secondary | ICD-10-CM | POA: Insufficient documentation

## 2018-10-25 DIAGNOSIS — E039 Hypothyroidism, unspecified: Secondary | ICD-10-CM | POA: Diagnosis not present

## 2018-10-25 DIAGNOSIS — I252 Old myocardial infarction: Secondary | ICD-10-CM | POA: Diagnosis not present

## 2018-10-25 DIAGNOSIS — R0789 Other chest pain: Secondary | ICD-10-CM | POA: Insufficient documentation

## 2018-10-25 DIAGNOSIS — Z8709 Personal history of other diseases of the respiratory system: Secondary | ICD-10-CM | POA: Diagnosis not present

## 2018-10-25 DIAGNOSIS — R079 Chest pain, unspecified: Secondary | ICD-10-CM

## 2018-10-25 HISTORY — DX: Acute myocardial infarction, unspecified: I21.9

## 2018-10-25 LAB — BASIC METABOLIC PANEL
Anion gap: 12 (ref 5–15)
BUN: 9 mg/dL (ref 8–23)
CO2: 29 mmol/L (ref 22–32)
Calcium: 10 mg/dL (ref 8.9–10.3)
Chloride: 99 mmol/L (ref 98–111)
Creatinine, Ser: 1.32 mg/dL — ABNORMAL HIGH (ref 0.44–1.00)
GFR calc Af Amer: 50 mL/min — ABNORMAL LOW (ref >60)
GFR calc non Af Amer: 43 mL/min — ABNORMAL LOW (ref >60)
Glucose, Bld: 109 mg/dL — ABNORMAL HIGH (ref 70–99)
Potassium: 4.1 mmol/L (ref 3.5–5.1)
Sodium: 140 mmol/L (ref 135–145)

## 2018-10-25 LAB — CBC
HCT: 43.3 % (ref 36.0–46.0)
Hemoglobin: 13.9 g/dL (ref 12.0–15.0)
MCH: 29.5 pg (ref 26.0–34.0)
MCHC: 32.1 g/dL (ref 30.0–36.0)
MCV: 91.9 fL (ref 80.0–100.0)
Platelets: 254 10*3/uL (ref 150–400)
RBC: 4.71 MIL/uL (ref 3.87–5.11)
RDW: 13.4 % (ref 11.5–15.5)
WBC: 7.6 10*3/uL (ref 4.0–10.5)
nRBC: 0 % (ref 0.0–0.2)

## 2018-10-25 LAB — TROPONIN I
Troponin I: <0.03 ng/mL (ref <0.03)
Troponin I: <0.03 ng/mL (ref <0.03)

## 2018-10-25 LAB — SARS CORONAVIRUS 2: SARS Coronavirus 2: NOT DETECTED

## 2018-10-25 LAB — BRAIN NATRIURETIC PEPTIDE: B Natriuretic Peptide: 25.1 pg/mL (ref 0.0–100.0)

## 2018-10-25 MED ORDER — SUCRALFATE 1 G PO TABS: 1.0000 g | ORAL_TABLET | Freq: Four times a day (QID) | ORAL | 0 refills | Status: DC | PRN

## 2018-10-25 MED ORDER — ALBUTEROL SULFATE HFA 108 (90 BASE) MCG/ACT IN AERS
2.0000 | INHALATION_SPRAY | Freq: Once | RESPIRATORY_TRACT | Status: AC
  Administered 2018-10-25: 13:00:00 2 via RESPIRATORY_TRACT
  Filled 2018-10-25: qty 6.7

## 2018-10-25 MED ORDER — GUAIFENESIN ER 600 MG PO TB12: 600.0000 mg | ORAL_TABLET | Freq: Two times a day (BID) | ORAL | 0 refills | Status: DC | PRN

## 2018-10-25 NOTE — ED Notes (Signed)
Patient verbalizes understanding of discharge instructions. Opportunity for questioning and answers were provided. Pt waiting on PTAR for transport home.

## 2018-10-25 NOTE — ED Notes (Signed)
Patient transported to X-ray 

## 2018-10-25 NOTE — ED Triage Notes (Signed)
Pt from home via ems; pt wearing LifeVest; pt c/o CP x 16 days;  L sided cp under breast, tender to palp, worse on inspiration x 16 days; also having L arm pain hurts more with movement; when pt stands, pt c/o mid back pressure; pt seen [facility] 1 week ago for same; hx MI, hiatal hernia  114 palp P 64 97.9 T 94% RA

## 2018-10-25 NOTE — ED Provider Notes (Signed)
[facility] Emergency Department Provider Note MRN:  [id number]  Arrival date & time: 10/25/18     Chief Complaint   Chest Pain   History of Present Illness   Carrie Walters is a 63 y.o. year-old female with a history of A. fib, cardiac arrest presenting to the ED with chief complaint of chest pain.  2 days of sharp central chest pain associated with shortness of breath.  Pain radiates to the left shoulder occasionally.  Denies headache or vision change, no nausea or vomiting.  Also endorsing a "whooshing sound" in her ears intermittently for the past several months.  Denies abdominal pain, no numbness or weakness to the arms or legs, no dysuria.  Taking her medications as directed, has not missed any doses of her blood thinner.  Review of Systems  A complete 10 system review of systems was obtained and all systems are negative except as noted in the HPI and PMH.   Patient's Health History    Past Medical History:  Diagnosis Date  . Anxiety   . Arthritis   . Asthma   . Atrial fibrillation (HCC)   . Cellulitis   . Dysrhythmia   . Hypertension   . Hypothyroidism   . MI (myocardial infarction) [facility])     Past Surgical History:  Procedure Laterality Date  . CARDIOVERSION N/A 03/18/2018   Procedure: CARDIOVERSION;  Surgeon: [name], MD;  [facility] ENDOSCOPY;  Service: Cardiovascular;  Laterality: N/A;  . CHOLECYSTECTOMY    . LEFT HEART CATH AND CORONARY ANGIOGRAPHY N/A 02/25/2018   Procedure: LEFT HEART CATH AND CORONARY ANGIOGRAPHY;  Surgeon: [name], MD;  Location: [facility] INVASIVE CV LAB;  Service: Cardiovascular;  Laterality: N/A;  . TEE WITHOUT CARDIOVERSION N/A 03/18/2018   Procedure: TRANSESOPHAGEAL ECHOCARDIOGRAM (TEE);  Surgeon: [name], MD;  Location[facility]COPY;  Service: Cardiovascular;  Laterality: N/A;  . UMBILICAL HERNIA REPAIR      Family History  Problem Relation Age of Onset  . Heart disease Father   . Atrial  fibrillation Brother     Social History   Socioeconomic History  . Marital status: Single    Spouse name: Not on file  . Number of children: 1  . Years of education: Not on file  . Highest education level: 12th grade  Occupational History  . Not on file  Social Needs  . Financial resource strain: Somewhat hard  . Food insecurity:    Worry: Never true    Inability: Never true  . Transportation needs:    Medical: Yes    Non-medical: No  Tobacco Use  . Smoking status: Former Games developer  . Smokeless tobacco: Never Used  Substance and Sexual Activity  . Alcohol use: Never    Frequency: Never  . Drug use: Never  . Sexual activity: Not on file  Lifestyle  . Physical activity:    Days per week: Not on file    Minutes per session: Not on file  . Stress: Not on file  Relationships  . Social connections:    Talks on phone: Not on file    Gets together: Not on file    Attends religious service: Not on file    Active member of club or organization: Not on file    Attends meetings of clubs or organizations: Not on file    Relationship status: Not on file  . Intimate partner violence:    Fear of current or ex partner: Not on file  Emotionally abused: Not on file    Physically abused: Not on file    Forced sexual activity: Not on file  Other Topics Concern  . Not on file  Social History Narrative   Moved from [place]     Physical Exam  Vital Signs and Nursing Notes reviewed Vitals:   10/25/18 1445 10/25/18 1530  BP: 119/73 138/87  Pulse: (!) 58 61  Resp: 20 (!) 21  Temp:    SpO2: 100% 99%    CONSTITUTIONAL: Well-appearing, NAD NEURO:  Alert and oriented x 3, no focal deficits EYES:  eyes equal and reactive ENT/NECK:  no LAD, no JVD CARDIO: Regular rate, well-perfused, normal S1 and S2 PULM:  CTAB no wheezing or rhonchi GI/GU:  normal bowel sounds, non-distended, non-tender MSK/SPINE:  No gross deformities, no edema SKIN:  no rash, atraumatic PSYCH:  Appropriate  speech and behavior  Diagnostic and Interventional Summary    EKG Interpretation  Date/Time:  Friday October 25 2018 11:37:48 EDT Ventricular Rate:  57 PR Interval:    QRS Duration: 108 QT Interval:  490 QTC Calculation: 478 R Axis:   0 Text Interpretation:  Sinus rhythm Short PR interval Borderline T wave abnormalities Baseline wander in lead(s) V2 V3 V4 V5 V6 Confirmed by Kennis Carina [id number]) on 10/25/2018 11:41:46 AM      Labs Reviewed  BASIC METABOLIC PANEL - Abnormal; Notable for the following components:      Result Value   Glucose, Bld 109 (*)    Creatinine, Ser 1.32 (*)    GFR calc non Af Amer 43 (*)    GFR calc Af Amer 50 (*)    All other components within normal limits  SARS CORONAVIRUS 2  CBC  TROPONIN I  BRAIN NATRIURETIC PEPTIDE  TROPONIN I    DG Chest 2 View  Final Result      Medications  albuterol (VENTOLIN HFA) 108 (90 Base) MCG/ACT inhaler 2 puff (2 puffs Inhalation Given 10/25/18 1251)     Procedures Critical Care  ED Course and Medical Decision Making  I have reviewed the triage vital signs and the nursing notes.  Pertinent labs & imaging results that were available during my care of the patient were reviewed by me and considered in my medical decision making (see below for details).  Chest pain is atypical, patient has a recent cardiac catheterization performed soon after her cardiac arrest that showed normal coronary arteries.  EKG is without acute concerns, will obtain 2 troponins.  Patient is anticoagulated, there is no evidence of DVT on exam, there is little to no concern for pulmonary embolism.  Troponin is negative x2, patient has normal vital signs and is very well-appearing with no increased work of breathing during her entire ED visit.  Patient explains she is also having increased mucus or postnasal drip, also explains that her chest pain might be related to her hiatal hernia pain.  No indication for further testing or admission, advise close  PCP follow-up.  After the discussed management above, the patient was determined to be safe for discharge.  The patient was in agreement with this plan and all questions regarding their care were answered.  ED return precautions were discussed and the patient will return to the ED with any significant worsening of condition.  Elmer Sow. Pilar Plate, MD [facility] Emergency [facility] [email or web address]  Final Clinical Impressions(s) / ED Diagnoses     ICD-10-CM   1. Chest pain, unspecified type R07.9  ED Discharge Orders         Ordered    sucralfate (CARAFATE) 1 g tablet  4 times daily PRN     10/25/18 1555    guaiFENesin (MUCINEX) 600 MG 12 hr tablet  2 times daily PRN     10/25/18 1555             Sabas Sous, MD 10/25/18 1557

## 2018-10-25 NOTE — Discharge Instructions (Addendum)
You were evaluated in the Emergency Department and after careful evaluation, we did not find any emergent condition requiring admission or further testing in the hospital.  Your testing today was very reassuring.  You can use the medications provided as needed for your symptoms.  As discussed, please call your primary care doctor as soon as possible to discuss your continued symptoms.  Please return to the Emergency Department if you experience any worsening of your condition.  We encourage you to follow up with a primary care provider.  Thank you for allowing [place] to be a part of your care.

## 2018-10-28 ENCOUNTER — Telehealth (HOSPITAL_COMMUNITY): Payer: Self-pay | Admitting: Licensed Clinical Social Worker

## 2018-10-28 NOTE — Telephone Encounter (Signed)
  CSW contacted patient to follow up on weekly food delivery package. Patient informed of no face to face contact during delivery. CSW discussed transition option for food delivery as the Covid 19 Food relief program will be ending on October 30, 2018. Patient verbalizes understanding and grateful for the assistance.  CSW continues to follow for supportive needs. Jackie Berma Harts, LCSW, CCSW-MCS [phone]  

## 2018-11-03 ENCOUNTER — Other Ambulatory Visit: Payer: Self-pay

## 2018-11-03 ENCOUNTER — Emergency Department (HOSPITAL_COMMUNITY): Payer: Medicaid Other

## 2018-11-03 ENCOUNTER — Inpatient Hospital Stay (HOSPITAL_COMMUNITY)
Admission: EM | Admit: 2018-11-03 | Discharge: 2018-11-06 | DRG: 227 | Disposition: A | Discharge status: Home / Self Care | Payer: Medicaid Other | Attending: Internal Medicine | Admitting: Internal Medicine

## 2018-11-03 ENCOUNTER — Encounter (HOSPITAL_COMMUNITY): Payer: Self-pay | Admitting: Emergency Medicine

## 2018-11-03 DIAGNOSIS — E876 Hypokalemia: Secondary | ICD-10-CM | POA: Diagnosis present

## 2018-11-03 DIAGNOSIS — M1990 Unspecified osteoarthritis, unspecified site: Secondary | ICD-10-CM | POA: Diagnosis present

## 2018-11-03 DIAGNOSIS — Z8679 Personal history of other diseases of the circulatory system: Secondary | ICD-10-CM

## 2018-11-03 DIAGNOSIS — Z7901 Long term (current) use of anticoagulants: Secondary | ICD-10-CM

## 2018-11-03 DIAGNOSIS — I428 Other cardiomyopathies: Secondary | ICD-10-CM | POA: Diagnosis present

## 2018-11-03 DIAGNOSIS — Z8249 Family history of ischemic heart disease and other diseases of the circulatory system: Secondary | ICD-10-CM

## 2018-11-03 DIAGNOSIS — Z881 Allergy status to other antibiotic agents status: Secondary | ICD-10-CM

## 2018-11-03 DIAGNOSIS — I480 Paroxysmal atrial fibrillation: Secondary | ICD-10-CM | POA: Diagnosis present

## 2018-11-03 DIAGNOSIS — I4581 Long QT syndrome: Secondary | ICD-10-CM | POA: Diagnosis present

## 2018-11-03 DIAGNOSIS — I252 Old myocardial infarction: Secondary | ICD-10-CM

## 2018-11-03 DIAGNOSIS — Z91041 Radiographic dye allergy status: Secondary | ICD-10-CM

## 2018-11-03 DIAGNOSIS — I5032 Chronic diastolic (congestive) heart failure: Secondary | ICD-10-CM

## 2018-11-03 DIAGNOSIS — I4901 Ventricular fibrillation: Principal | ICD-10-CM | POA: Diagnosis present

## 2018-11-03 DIAGNOSIS — I4819 Other persistent atrial fibrillation: Secondary | ICD-10-CM | POA: Diagnosis present

## 2018-11-03 DIAGNOSIS — I509 Heart failure, unspecified: Secondary | ICD-10-CM

## 2018-11-03 DIAGNOSIS — R079 Chest pain, unspecified: Secondary | ICD-10-CM

## 2018-11-03 DIAGNOSIS — Z959 Presence of cardiac and vascular implant and graft, unspecified: Secondary | ICD-10-CM

## 2018-11-03 DIAGNOSIS — I872 Venous insufficiency (chronic) (peripheral): Secondary | ICD-10-CM | POA: Diagnosis present

## 2018-11-03 DIAGNOSIS — Z6841 Body Mass Index (BMI) 40.0 and over, adult: Secondary | ICD-10-CM

## 2018-11-03 DIAGNOSIS — Z79899 Other long term (current) drug therapy: Secondary | ICD-10-CM

## 2018-11-03 DIAGNOSIS — F419 Anxiety disorder, unspecified: Secondary | ICD-10-CM | POA: Diagnosis present

## 2018-11-03 DIAGNOSIS — Z8674 Personal history of sudden cardiac arrest: Secondary | ICD-10-CM

## 2018-11-03 DIAGNOSIS — I110 Hypertensive heart disease with heart failure: Secondary | ICD-10-CM | POA: Diagnosis present

## 2018-11-03 DIAGNOSIS — I5022 Chronic systolic (congestive) heart failure: Secondary | ICD-10-CM | POA: Diagnosis present

## 2018-11-03 DIAGNOSIS — J449 Chronic obstructive pulmonary disease, unspecified: Secondary | ICD-10-CM | POA: Diagnosis present

## 2018-11-03 DIAGNOSIS — Z9049 Acquired absence of other specified parts of digestive tract: Secondary | ICD-10-CM

## 2018-11-03 DIAGNOSIS — Z79890 Hormone replacement therapy (postmenopausal): Secondary | ICD-10-CM

## 2018-11-03 DIAGNOSIS — Z1159 Encounter for screening for other viral diseases: Secondary | ICD-10-CM

## 2018-11-03 DIAGNOSIS — E039 Hypothyroidism, unspecified: Secondary | ICD-10-CM | POA: Diagnosis present

## 2018-11-03 DIAGNOSIS — Z888 Allergy status to other drugs, medicaments and biological substances status: Secondary | ICD-10-CM

## 2018-11-03 LAB — CBC WITH DIFFERENTIAL/PLATELET
Abs Immature Granulocytes: 0.05 10*3/uL (ref 0.00–0.07)
Basophils Absolute: 0.1 10*3/uL (ref 0.0–0.1)
Basophils Relative: 1 %
Eosinophils Absolute: 0.2 10*3/uL (ref 0.0–0.5)
Eosinophils Relative: 3 %
HCT: 42.5 % (ref 36.0–46.0)
Hemoglobin: 13.4 g/dL (ref 12.0–15.0)
Immature Granulocytes: 1 %
Lymphocytes Relative: 22 %
Lymphs Abs: 1.9 10*3/uL (ref 0.7–4.0)
MCH: 29.1 pg (ref 26.0–34.0)
MCHC: 31.5 g/dL (ref 30.0–36.0)
MCV: 92.2 fL (ref 80.0–100.0)
Monocytes Absolute: 0.6 10*3/uL (ref 0.1–1.0)
Monocytes Relative: 7 %
Neutro Abs: 5.7 10*3/uL (ref 1.7–7.7)
Neutrophils Relative %: 66 %
Platelets: 256 10*3/uL (ref 150–400)
RBC: 4.61 MIL/uL (ref 3.87–5.11)
RDW: 13.6 % (ref 11.5–15.5)
WBC: 8.5 10*3/uL (ref 4.0–10.5)
nRBC: 0 % (ref 0.0–0.2)

## 2018-11-03 NOTE — ED Provider Notes (Signed)
[facility] EMERGENCY DEPARTMENT Provider Note   CSN: [id number] Arrival date & time: 11/03/18  2041    History   Chief Complaint Chief Complaint  Patient presents with  . Chest Pain    HPI Carrie Walters is a 63 y.o. female.     Carrie Walters is a 63 y.o. female with a history of V. fib arrest, CHF, MI, hypertension, A. fib, cellulitis, asthma, hypothyroidism and anxiety, who presents to the emergency department for evaluation of intermittent left-sided chest pressure and LifeVest alarm.  Patient reports over the past few weeks she has had left-sided chest pressure on and off.  She reports that today her LifeVest alarmed twice, she stopped it from shocking her and then presented to the emergency department.  She denies chest pain preceding LifeVest alarm.  Does report a few instances today in which she has felt like her heart was racing, this typically happens when she is up moving around she reports associated shortness of breath.  Reports slight worsening of chronic lower extremity edema.  No fevers or cough.  No abdominal pain, nausea or vomiting.  She has not taken anything for symptoms prior to arrival.  She was initially supposed to have a defibrillator placed but due to wounds on lower extremities surgery was postponed and then further delayed by COVID-19.     Past Medical History:  Diagnosis Date  . Anxiety   . Arthritis   . Asthma   . Atrial fibrillation (HCC)   . Cellulitis   . Dysrhythmia   . Hypertension   . Hypothyroidism   . MI (myocardial infarction) [facility])     Patient Active Problem List   Diagnosis Date Noted  . Acute on chronic systolic (congestive) heart failure (HCC) 07/23/2018  . CHF (congestive heart failure) (HCC) 04/02/2018  . Acute on chronic combined systolic and diastolic CHF (congestive heart failure) (HCC) 03/14/2018  . Diarrhea 03/14/2018  . Left upper quadrant pain 03/14/2018  . Cardiac arrest (HCC)   . Acute systolic CHF  (congestive heart failure) (HCC)   . Pressure injury of skin 02/22/2018  . A-fib (HCC) 02/21/2018  . Atrial flutter (HCC) 12/27/2017  . Morbid obesity (HCC) 12/27/2017  . Hypothyroidism 12/21/2017    Past Surgical History:  Procedure Laterality Date  . CARDIOVERSION N/A 03/18/2018   Procedure: CARDIOVERSION;  Surgeon: [name], MD;  Location: [facility] ENDOSCOPY;  Service: Cardiovascular;  Laterality: N/A;  . CHOLECYSTECTOMY    . LEFT HEART CATH AND CORONARY ANGIOGRAPHY N/A 02/25/2018   Procedure: LEFT HEART CATH AND CORONARY ANGIOGRAPHY;  Surgeon: [name], MD;  Location: [facility] INVASIVE CV LAB;  Service: Cardiovascular;  Laterality: N/A;  . TEE WITHOUT CARDIOVERSION N/A 03/18/2018   Procedure: TRANSESOPHAGEAL ECHOCARDIOGRAM (TEE);  Surgeon: [name], MD;  Location: [facility] ENDOSCOPY;  Service: Cardiovascular;  Laterality: N/A;  . UMBILICAL HERNIA REPAIR       OB History   No obstetric history on file.      Home Medications    Prior to Admission medications   Medication Sig Start Date End Date Taking? Authorizing Provider  albuterol (PROVENTIL HFA;VENTOLIN HFA) 108 (90 Base) MCG/ACT inhaler Inhale 2 puffs into the lungs every 6 (six) hours as needed for wheezing or shortness of breath.    [provider]  amiodarone (PACERONE) 200 MG tablet Take 1 tablet (200 mg total) by mouth at bedtime. 09/11/18   Clegg, Amy D, NP  apixaban (ELIQUIS) 5 MG TABS tablet Take 1 tablet (  5 mg total) by mouth 2 (two) times daily. 04/02/18   Laurey MoraleMcLean, Dalton S, MD  bisoprolol (ZEBETA) 5 MG tablet Take 1 tablet (5 mg total) by mouth daily. 07/26/18   Clegg, Amy D, NP  furosemide (LASIX) 40 MG tablet Take 1 tablet (40 mg total) by mouth 2 (two) times daily. TAKE ADDITIONAL 1/2 TAB FOR EDEMA OR DYSPNEA Patient taking differently: Take 40 mg by mouth See admin instructions. TAKE ONE TABLET (40MG ) TWICE DAILY, MAY TAKE ADDITIONAL 1/2 TAB (20MG ) FOR EDEMA OR DYSPNEA. 10/07/18   Clegg, Amy D, NP   guaiFENesin (MUCINEX) 600 MG 12 hr tablet Take 1 tablet (600 mg total) by mouth 2 (two) times daily as needed. 10/25/18   Sabas SousBero, Michael M, MD  KLOR-CON M20 20 MEQ tablet TAKE 1 TABLET BY MOUTH EVERY DAY Patient taking differently: Take 20 mEq by mouth daily.  10/17/18   Laurey MoraleMcLean, Dalton S, MD  levothyroxine (SYNTHROID, LEVOTHROID) 50 MCG tablet Take 1 tablet (50 mcg total) by mouth daily before breakfast. 07/27/18   Clegg, Amy D, NP  LORazepam (ATIVAN) 1 MG tablet Take 1 tablet (1 mg total) by mouth every 8 (eight) hours as needed for anxiety. 10/16/18   Zadie RhineWickline, Donald, MD  montelukast (SINGULAIR) 10 MG tablet Take 10 mg by mouth at bedtime.    [provider]  omeprazole (PRILOSEC) 40 MG capsule Take 40 mg by mouth daily.    [provider]  pantoprazole (PROTONIX) 40 MG tablet Take 1 tablet (40 mg total) by mouth daily. Patient not taking: Reported on 10/21/2018 10/09/18   Pricilla LovelessGoldston, Scott, MD  spironolactone (ALDACTONE) 25 MG tablet Take 1 tablet (25 mg total) by mouth daily. 09/24/18   Clegg, Amy D, NP  sucralfate (CARAFATE) 1 g tablet Take 1 tablet (1 g total) by mouth 4 (four) times daily as needed. 10/25/18   Sabas SousBero, Michael M, MD    Family History Family History  Problem Relation Age of Onset  . Heart disease Father   . Atrial fibrillation Brother     Social History Social History   Tobacco Use  . Smoking status: Former Games developermoker  . Smokeless tobacco: Never Used  Substance Use Topics  . Alcohol use: Never    Frequency: Never  . Drug use: Never     Allergies   Metoprolol tartrate, Biaxin [clarithromycin], Levaquin [levofloxacin], and Contrast media [iodinated diagnostic agents]   Review of Systems Review of Systems  Constitutional: Negative for chills and fever.  Eyes: Negative for visual disturbance.  Respiratory: Positive for shortness of breath. Negative for cough.   Cardiovascular: Positive for chest pain, palpitations and leg swelling.  Gastrointestinal:  Negative for abdominal pain, nausea and vomiting.  Genitourinary: Negative for dysuria and hematuria.  Musculoskeletal: Negative for arthralgias and myalgias.  Skin: Negative for color change, rash and wound.  Neurological: Negative for dizziness, syncope and light-headedness.  All other systems reviewed and are negative.    Physical Exam Updated Vital Signs BP 106/64   Pulse (!) 57   Temp 98.2 F (36.8 C)   Resp 20   Ht 5\' 6"  (1.676 m)   Wt 113.4 kg   SpO2 97%   BMI 40.35 kg/m   Physical Exam Vitals signs and nursing note reviewed.  Constitutional:      General: She is not in acute distress.    Appearance: She is well-developed. She is obese. She is not diaphoretic.     Comments: Chronically ill-appearing but in no acute distress.  HENT:  Head: Normocephalic and atraumatic.  Eyes:     General:        Right eye: No discharge.        Left eye: No discharge.     Pupils: Pupils are equal, round, and reactive to light.  Neck:     Musculoskeletal: Neck supple.  Cardiovascular:     Rate and Rhythm: Normal rate. Rhythm irregular.     Pulses:          Radial pulses are 2+ on the right side and 2+ on the left side.       Dorsalis pedis pulses are 2+ on the right side and 2+ on the left side.     Heart sounds: Normal heart sounds. No murmur. No friction rub. No gallop.      Comments: A. fib, rate controlled. Pulmonary:     Effort: Pulmonary effort is normal. No respiratory distress.     Breath sounds: Normal breath sounds. No wheezing or rales.     Comments: Respirations equal and unlabored, patient able to speak in full sentences, lungs clear to auscultation bilaterally Chest:     Comments: LifeVest in place. Abdominal:     General: Bowel sounds are normal. There is no distension.     Palpations: Abdomen is soft. There is no mass.     Tenderness: There is no abdominal tenderness. There is no guarding.     Comments: Abdomen soft, nondistended, nontender to palpation in  all quadrants without guarding or peritoneal signs  Musculoskeletal:        General: No deformity.     Comments: Swelling of bilateral lower extremities worse on the left than right which patient reports is chronic, both are mildly erythematous but with no wounds, drainage or fluctuance.  Skin:    General: Skin is warm and dry.     Capillary Refill: Capillary refill takes less than 2 seconds.  Neurological:     Mental Status: She is alert.     Coordination: Coordination normal.     Comments: Speech is clear, able to follow commands Moves extremities without ataxia, coordination intact  Psychiatric:        Mood and Affect: Mood normal.        Behavior: Behavior normal.      ED Treatments / Results  Labs (all labs ordered are listed, but only abnormal results are displayed) Labs Reviewed  SARS CORONAVIRUS 2  CBC WITH DIFFERENTIAL/PLATELET  MAGNESIUM  BRAIN NATRIURETIC PEPTIDE  TROPONIN I  BASIC METABOLIC PANEL    EKG EKG Interpretation  Date/Time:  Sunday November 03 2018 20:54:24 EDT Ventricular Rate:  61 PR Interval:    QRS Duration: 125 QT Interval:  526 QTC Calculation: 530 R Axis:   10 Text Interpretation:  Atrial fibrillation Nonspecific intraventricular conduction delay No significant change since last tracing Confirmed by Wandra Arthurs ([id number]) on 11/03/2018 9:57:25 PM   Radiology Dg Chest Port 1 View  Result Date: 11/03/2018 CLINICAL DATA:  Intermittent chest pressure for several weeks. Pain is worse today. EXAM: PORTABLE CHEST 1 VIEW COMPARISON:  Two-view chest x-ray 10/25/2018 FINDINGS: The heart size is normal. Linear airspace opacities are present both bases. There is mild pulmonary vascular congestion without frank edema. Small effusions are likely present. The visualized soft tissues and bony thorax are unremarkable. IMPRESSION: 1. Cardiomegaly and mild pulmonary vascular congestion. 2. Bibasilar airspace disease has increased. While this may represent  atelectasis, infection is not excluded. Electronically Signed   By: San Morelle  M.D.   On: 11/03/2018 21:35    Procedures Procedures (including critical care time)  Medications Ordered in ED Medications - No data to display   Initial Impression / Assessment and Plan / ED Course  I have reviewed the triage vital signs and the nursing notes.  Pertinent labs & imaging results that were available during my care of the patient were reviewed by me and considered in my medical decision making (see chart for details).  Patient presents with intermittent chest pains, reporting that her LifeVest alarmed twice today, history of V. fib arrest and cardiomyopathy.  On arrival vitals normal and patient is in no acute distress, currently denies any chest pain or shortness of breath but reports shortness of breath on exertion with slight worsening of lower extremity swelling.  Concern for possible V. tach given multiple LifeVest alarms today, contacted company but unable to interrogate today as patient does not have her hotspot but this can hopefully be brought tomorrow for potential interrogation.  Feel patient would likely benefit from observation and monitoring on telemetry.  Will check basic labs, troponin, EKG and chest x-ray.  EKG shows rate controlled A. fib, troponin negative. Chest x-ray shows cardiomegaly with mild pulmonary vascular congestion, there is bilateral basilar airspace disease which may represent atelectasis, COVID test pending.  12:00 AM At shift change care signed [facility] who will follow up on lab results and admit patient to hospitalist service. Case discussed with Cardiology fellow, they will plan to consult in AM, with plans for life vest interrogation tomorrow.  Final Clinical Impressions(s) / ED Diagnoses   Final diagnoses:  Left-sided chest pain  Hx of ventricular fibrillation    ED Discharge Orders    None       Legrand RamsFord, Kaydynce Pat N, PA-C 11/07/18 0124     Charlynne PanderYao, David Hsienta, MD 11/07/18 [phone]

## 2018-11-03 NOTE — ED Triage Notes (Signed)
Chest pressure on and off for several weeks, worse today. Pt is wearing life vest. States she was waqlking to the bathroom and her life vest alarmed as if it was going to shock her and she stopped it.

## 2018-11-04 DIAGNOSIS — Z6841 Body Mass Index (BMI) 40.0 and over, adult: Secondary | ICD-10-CM | POA: Diagnosis not present

## 2018-11-04 DIAGNOSIS — I5022 Chronic systolic (congestive) heart failure: Secondary | ICD-10-CM | POA: Diagnosis present

## 2018-11-04 DIAGNOSIS — Z79890 Hormone replacement therapy (postmenopausal): Secondary | ICD-10-CM | POA: Diagnosis not present

## 2018-11-04 DIAGNOSIS — I4901 Ventricular fibrillation: Principal | ICD-10-CM

## 2018-11-04 DIAGNOSIS — Z888 Allergy status to other drugs, medicaments and biological substances status: Secondary | ICD-10-CM | POA: Diagnosis not present

## 2018-11-04 DIAGNOSIS — J449 Chronic obstructive pulmonary disease, unspecified: Secondary | ICD-10-CM | POA: Diagnosis present

## 2018-11-04 DIAGNOSIS — I4819 Other persistent atrial fibrillation: Secondary | ICD-10-CM | POA: Diagnosis present

## 2018-11-04 DIAGNOSIS — M1990 Unspecified osteoarthritis, unspecified site: Secondary | ICD-10-CM | POA: Diagnosis present

## 2018-11-04 DIAGNOSIS — I872 Venous insufficiency (chronic) (peripheral): Secondary | ICD-10-CM | POA: Diagnosis present

## 2018-11-04 DIAGNOSIS — F419 Anxiety disorder, unspecified: Secondary | ICD-10-CM | POA: Diagnosis present

## 2018-11-04 DIAGNOSIS — Z881 Allergy status to other antibiotic agents status: Secondary | ICD-10-CM | POA: Diagnosis not present

## 2018-11-04 DIAGNOSIS — Z8674 Personal history of sudden cardiac arrest: Secondary | ICD-10-CM | POA: Diagnosis not present

## 2018-11-04 DIAGNOSIS — I428 Other cardiomyopathies: Secondary | ICD-10-CM | POA: Diagnosis present

## 2018-11-04 DIAGNOSIS — Z8679 Personal history of other diseases of the circulatory system: Secondary | ICD-10-CM | POA: Diagnosis not present

## 2018-11-04 DIAGNOSIS — Z91041 Radiographic dye allergy status: Secondary | ICD-10-CM | POA: Diagnosis not present

## 2018-11-04 DIAGNOSIS — I4581 Long QT syndrome: Secondary | ICD-10-CM | POA: Diagnosis present

## 2018-11-04 DIAGNOSIS — Z7901 Long term (current) use of anticoagulants: Secondary | ICD-10-CM | POA: Diagnosis not present

## 2018-11-04 DIAGNOSIS — I252 Old myocardial infarction: Secondary | ICD-10-CM | POA: Diagnosis not present

## 2018-11-04 DIAGNOSIS — E876 Hypokalemia: Secondary | ICD-10-CM | POA: Diagnosis present

## 2018-11-04 DIAGNOSIS — Z79899 Other long term (current) drug therapy: Secondary | ICD-10-CM | POA: Diagnosis not present

## 2018-11-04 DIAGNOSIS — Z1159 Encounter for screening for other viral diseases: Secondary | ICD-10-CM | POA: Diagnosis not present

## 2018-11-04 DIAGNOSIS — I110 Hypertensive heart disease with heart failure: Secondary | ICD-10-CM | POA: Diagnosis present

## 2018-11-04 DIAGNOSIS — E039 Hypothyroidism, unspecified: Secondary | ICD-10-CM | POA: Diagnosis present

## 2018-11-04 DIAGNOSIS — I480 Paroxysmal atrial fibrillation: Secondary | ICD-10-CM | POA: Diagnosis present

## 2018-11-04 LAB — SARS CORONAVIRUS 2: SARS Coronavirus 2: NOT DETECTED

## 2018-11-04 LAB — SURGICAL PCR SCREEN
MRSA, PCR: NEGATIVE
Staphylococcus aureus: POSITIVE — AB

## 2018-11-04 LAB — TROPONIN I: Troponin I: <0.03 ng/mL (ref <0.03)

## 2018-11-04 LAB — MAGNESIUM: Magnesium: 2.1 mg/dL (ref 1.7–2.4)

## 2018-11-04 LAB — BASIC METABOLIC PANEL
Anion gap: 12 (ref 5–15)
BUN: 9 mg/dL (ref 8–23)
CO2: 26 mmol/L (ref 22–32)
Calcium: 9.4 mg/dL (ref 8.9–10.3)
Chloride: 100 mmol/L (ref 98–111)
Creatinine, Ser: 1.31 mg/dL — ABNORMAL HIGH (ref 0.44–1.00)
GFR calc Af Amer: 50 mL/min — ABNORMAL LOW (ref >60)
GFR calc non Af Amer: 43 mL/min — ABNORMAL LOW (ref >60)
Glucose, Bld: 87 mg/dL (ref 70–99)
Potassium: 3.3 mmol/L — ABNORMAL LOW (ref 3.5–5.1)
Sodium: 138 mmol/L (ref 135–145)

## 2018-11-04 LAB — TSH: TSH: 5.126 u[IU]/mL — ABNORMAL HIGH (ref 0.350–4.500)

## 2018-11-04 LAB — BRAIN NATRIURETIC PEPTIDE: B Natriuretic Peptide: 24.3 pg/mL (ref 0.0–100.0)

## 2018-11-04 MED ORDER — SODIUM CHLORIDE 0.9% FLUSH
3.0000 mL | Freq: Two times a day (BID) | INTRAVENOUS | Status: DC
  Administered 2018-11-04 – 2018-11-05 (×2): 3 mL via INTRAVENOUS

## 2018-11-04 MED ORDER — MUPIROCIN 2 % EX OINT
1.0000 "application " | TOPICAL_OINTMENT | Freq: Two times a day (BID) | CUTANEOUS | Status: DC
  Administered 2018-11-05 – 2018-11-06 (×4): 1 via NASAL
  Filled 2018-11-04 (×2): qty 22

## 2018-11-04 MED ORDER — ONDANSETRON HCL 4 MG/2ML IJ SOLN: 4.0000 mg | Freq: Four times a day (QID) | INTRAMUSCULAR | Status: DC | PRN

## 2018-11-04 MED ORDER — CEFAZOLIN SODIUM-DEXTROSE 2-4 GM/100ML-% IV SOLN
2.0000 g | INTRAVENOUS | Status: AC
  Administered 2018-11-05: 15:00:00 2 g via INTRAVENOUS
  Filled 2018-11-04: qty 100

## 2018-11-04 MED ORDER — SODIUM CHLORIDE 0.9 % IV SOLN
80.0000 mg | INTRAVENOUS | Status: AC
  Administered 2018-11-05: 16:00:00 80 mg
  Filled 2018-11-04: qty 2

## 2018-11-04 MED ORDER — ACETAMINOPHEN 325 MG PO TABS
650.0000 mg | ORAL_TABLET | ORAL | Status: DC | PRN
  Administered 2018-11-05: 06:00:00 650 mg via ORAL
  Filled 2018-11-04: qty 2

## 2018-11-04 MED ORDER — ALBUTEROL SULFATE (2.5 MG/3ML) 0.083% IN NEBU
3.0000 mL | INHALATION_SOLUTION | Freq: Four times a day (QID) | RESPIRATORY_TRACT | Status: DC | PRN
  Administered 2018-11-04 – 2018-11-05 (×2): 3 mL via RESPIRATORY_TRACT
  Filled 2018-11-04 (×2): qty 3

## 2018-11-04 MED ORDER — LORAZEPAM 1 MG PO TABS: 1.0000 mg | ORAL_TABLET | Freq: Three times a day (TID) | ORAL | Status: DC | PRN

## 2018-11-04 MED ORDER — APIXABAN 5 MG PO TABS
5.0000 mg | ORAL_TABLET | Freq: Two times a day (BID) | ORAL | Status: DC
  Administered 2018-11-04: 5 mg via ORAL
  Filled 2018-11-04: qty 1

## 2018-11-04 MED ORDER — SODIUM CHLORIDE 0.9 % IV SOLN
INTRAVENOUS | Status: DC
  Administered 2018-11-05: 06:00:00 via INTRAVENOUS

## 2018-11-04 MED ORDER — ALBUTEROL SULFATE (2.5 MG/3ML) 0.083% IN NEBU
2.5000 mg | INHALATION_SOLUTION | Freq: Four times a day (QID) | RESPIRATORY_TRACT | Status: DC | PRN
  Administered 2018-11-04: 2.5 mg via RESPIRATORY_TRACT
  Filled 2018-11-04: qty 3

## 2018-11-04 MED ORDER — BISOPROLOL FUMARATE 5 MG PO TABS
5.0000 mg | ORAL_TABLET | Freq: Every day | ORAL | Status: DC
  Administered 2018-11-04 – 2018-11-06 (×3): 5 mg via ORAL
  Filled 2018-11-04 (×4): qty 1

## 2018-11-04 MED ORDER — CHLORHEXIDINE GLUCONATE 4 % EX LIQD
60.0000 mL | Freq: Once | CUTANEOUS | Status: AC
  Administered 2018-11-04: 4 via TOPICAL
  Filled 2018-11-04: qty 60

## 2018-11-04 MED ORDER — POTASSIUM CHLORIDE CRYS ER 20 MEQ PO TBCR
20.0000 meq | EXTENDED_RELEASE_TABLET | Freq: Every day | ORAL | Status: DC
  Administered 2018-11-04 – 2018-11-06 (×3): 20 meq via ORAL
  Filled 2018-11-04 (×4): qty 1

## 2018-11-04 MED ORDER — LEVOTHYROXINE SODIUM 50 MCG PO TABS
50.0000 ug | ORAL_TABLET | Freq: Every day | ORAL | Status: DC
  Administered 2018-11-04 – 2018-11-06 (×3): 50 ug via ORAL
  Filled 2018-11-04 (×3): qty 1

## 2018-11-04 MED ORDER — SODIUM CHLORIDE 0.9% FLUSH: 3.0000 mL | INTRAVENOUS | Status: DC | PRN

## 2018-11-04 MED ORDER — MONTELUKAST SODIUM 10 MG PO TABS
10.0000 mg | ORAL_TABLET | Freq: Every day | ORAL | Status: DC
  Administered 2018-11-04 – 2018-11-05 (×2): 10 mg via ORAL
  Filled 2018-11-04 (×2): qty 1

## 2018-11-04 MED ORDER — SODIUM CHLORIDE 0.9% FLUSH
3.0000 mL | Freq: Two times a day (BID) | INTRAVENOUS | Status: DC
  Administered 2018-11-04 – 2018-11-05 (×3): 3 mL via INTRAVENOUS

## 2018-11-04 MED ORDER — FUROSEMIDE 40 MG PO TABS
40.0000 mg | ORAL_TABLET | Freq: Two times a day (BID) | ORAL | Status: DC
  Administered 2018-11-04 – 2018-11-06 (×5): 40 mg via ORAL
  Filled 2018-11-04 (×5): qty 1

## 2018-11-04 MED ORDER — ALBUTEROL SULFATE HFA 108 (90 BASE) MCG/ACT IN AERS: 2.0000 | INHALATION_SPRAY | Freq: Four times a day (QID) | RESPIRATORY_TRACT | Status: DC | PRN

## 2018-11-04 MED ORDER — SODIUM CHLORIDE 0.9 % IV SOLN: 250.0000 mL | INTRAVENOUS | Status: DC

## 2018-11-04 MED ORDER — CHLORHEXIDINE GLUCONATE CLOTH 2 % EX PADS
6.0000 | MEDICATED_PAD | Freq: Every day | CUTANEOUS | Status: DC
  Administered 2018-11-05: 6 via TOPICAL

## 2018-11-04 MED ORDER — GUAIFENESIN ER 600 MG PO TB12: 600.0000 mg | ORAL_TABLET | Freq: Two times a day (BID) | ORAL | Status: DC | PRN

## 2018-11-04 MED ORDER — PANTOPRAZOLE SODIUM 40 MG PO TBEC
80.0000 mg | DELAYED_RELEASE_TABLET | Freq: Every day | ORAL | Status: DC
  Administered 2018-11-04 – 2018-11-06 (×3): 80 mg via ORAL
  Filled 2018-11-04 (×3): qty 2

## 2018-11-04 MED ORDER — SODIUM CHLORIDE 0.9 % IV SOLN: 250.0000 mL | INTRAVENOUS | Status: DC | PRN

## 2018-11-04 MED ORDER — SPIRONOLACTONE 25 MG PO TABS
25.0000 mg | ORAL_TABLET | Freq: Every day | ORAL | Status: DC
  Administered 2018-11-04 – 2018-11-06 (×3): 25 mg via ORAL
  Filled 2018-11-04 (×3): qty 1

## 2018-11-04 MED ORDER — POTASSIUM CHLORIDE CRYS ER 20 MEQ PO TBCR
40.0000 meq | EXTENDED_RELEASE_TABLET | Freq: Once | ORAL | Status: AC
  Administered 2018-11-04: 40 meq via ORAL
  Filled 2018-11-04: qty 2

## 2018-11-04 MED ORDER — AMIODARONE HCL 200 MG PO TABS
200.0000 mg | ORAL_TABLET | Freq: Every day | ORAL | Status: DC
  Administered 2018-11-04 – 2018-11-05 (×2): 200 mg via ORAL
  Filled 2018-11-04 (×2): qty 1

## 2018-11-04 MED ORDER — ORAL CARE MOUTH RINSE
15.0000 mL | Freq: Two times a day (BID) | OROMUCOSAL | Status: DC
  Administered 2018-11-04 – 2018-11-05 (×3): 15 mL via OROMUCOSAL

## 2018-11-04 MED ORDER — SUCRALFATE 1 G PO TABS: 1.0000 g | ORAL_TABLET | Freq: Four times a day (QID) | ORAL | Status: DC | PRN

## 2018-11-04 NOTE — Plan of Care (Signed)
  Problem: Education: Goal: Knowledge of General Education information will improve Description Including pain rating scale, medication(s)/side effects and non-pharmacologic comfort measures Outcome: Progressing   Problem: Health Behavior/Discharge Planning: Goal: Ability to manage health-related needs will improve Outcome: Progressing   

## 2018-11-04 NOTE — Discharge Instructions (Addendum)
Supplemental Discharge Instructions for  Pacemaker/Defibrillator Patients  Activity No heavy lifting or vigorous activity with your left/right arm for 6 to 8 weeks.  Do not raise your left/right arm above your head for one week.  Gradually raise your affected arm as drawn below.              11/09/2018                 11/10/2018                11/11/2018              11/12/2018 __  NO DRIVING for 6 months that started on 07/23/2018.  WOUND CARE - Keep the wound area clean and dry.  Do not get this area wet, no showers until cleared to at your wound check visit . - The tape/steri-strips on your wound will fall off; do not pull them off.  No bandage is needed on the site.  DO  NOT apply any creams, oils, or ointments to the wound area. - If you notice any drainage or discharge from the wound, any swelling or bruising at the site, or you develop a fever > 101? F after you are discharged home, call the office at once.  Special Instructions - You are still able to use cellular telephones; use the ear opposite the side where you have your pacemaker/defibrillator.  Avoid carrying your cellular phone near your device. - When traveling through airports, show security personnel your identification card to avoid being screened in the metal detectors.  Ask the security personnel to use the hand wand. - Avoid arc welding equipment, TENS units (transcutaneous nerve stimulators).  Call the office for questions about other devices. - Avoid electrical appliances that are in poor condition or are not properly grounded. - Microwave ovens are safe to be near or to operate.  Additional information for defibrillator patients should your device go off: - If your device goes off ONCE and you feel fine afterward, notify the device clinic nurses. - If your device goes off ONCE and you do not feel well afterward, call 911. - If your device goes off TWICE, call 911. - If your device goes off THREE times in one day,  call 911.  DO NOT DRIVE YOURSELF OR A FAMILY MEMBER WITH A DEFIBRILLATOR TO THE HOSPITAL--CALL 911.    Information on my medicine - ELIQUIS (apixaban)  Why was Eliquis prescribed for you? Eliquis was prescribed for you to reduce the risk of a blood clot forming that can cause a stroke if you have a medical condition called atrial fibrillation (a type of irregular heartbeat).  What do You need to know about Eliquis ? Take your Eliquis TWICE DAILY - one tablet in the morning and one tablet in the evening with or without food. If you have difficulty swallowing the tablet whole please discuss with your pharmacist how to take the medication safely.  Take Eliquis exactly as prescribed by your doctor and DO NOT stop taking Eliquis without talking to the doctor who prescribed the medication.  Stopping may increase your risk of developing a stroke.  Refill your prescription before you run out.  After discharge, you should have regular check-up appointments with your healthcare provider that is prescribing your Eliquis.  In the future your dose may need to be changed if your kidney function or weight changes by a significant amount or as you get older.  What do you  do if you miss a dose? If you miss a dose, take it as soon as you remember on the same day and resume taking twice daily.  Do not take more than one dose of ELIQUIS at the same time to make up a missed dose.  Important Safety Information A possible side effect of Eliquis is bleeding. You should call your healthcare provider right away if you experience any of the following: ? Bleeding from an injury or your nose that does not stop. ? Unusual colored urine (red or dark brown) or unusual colored stools (red or black). ? Unusual bruising for unknown reasons. ? A serious fall or if you hit your head (even if there is no bleeding).  Some medicines may interact with Eliquis and might increase your risk of bleeding or clotting while  on Eliquis. To help avoid this, consult your healthcare provider or pharmacist prior to using any new prescription or non-prescription medications, including herbals, vitamins, non-steroidal anti-inflammatory drugs (NSAIDs) and supplements.  This website has more information on Eliquis (apixaban): http://www.eliquis.com/eliquis/home

## 2018-11-04 NOTE — ED Notes (Signed)
ED TO INPATIENT HANDOFF REPORT  ED Nurse Name and Phone #: [phone] Myya Meenach RN  S Name/Age/Gender Carrie Walters 63 y.o. female Room/Bed: 014C/014C  Code Status   Code Status: Prior  Home/SNF/Other Home Patient oriented to: self, place, time and situation Is this baseline? Yes   Triage Complete: Triage complete  Chief Complaint Chest Pressure  Triage Note Chest pressure on and off for several weeks, worse today. Pt is wearing life vest. States she was waqlking to the bathroom and her life vest alarmed as if it was going to shock her and she stopped it.    Allergies Allergies  Allergen Reactions  . Metoprolol Tartrate Shortness Of Breath  . Biaxin [Clarithromycin] Other (See Comments)    Sores in mouth  . Levaquin [Levofloxacin] Other (See Comments)    Sores in mouth  . Contrast Media [Iodinated Diagnostic Agents] Rash    Level of Care/Admitting Diagnosis ED Disposition    ED Disposition Condition Comment   Admit  Hospital Area: [place] [[id number]]  Level of Care: Telemetry Medical [104]  Covid Evaluation: Screening Protocol (No Symptoms)  Diagnosis: CHF (congestive heart failure) [facility]) [[id number]]  Admitting Physician: Marcie Mowers [[id number]]  Attending Physician: Leonie Man [[id number]  PT Class (Do Not Modify): Observation [104]  PT Acc Code (Do Not Modify): Observation [10022]       B Medical/Surgery History Past Medical History:  Diagnosis Date  . Anxiety   . Arthritis   . Asthma   . Atrial fibrillation ([place])   . Cellulitis   . Dysrhythmia   . Hypertension   . Hypothyroidism   . MI (myocardial infarction) [facility])    Past Surgical History:  Procedure Laterality Date  . CARDIOVERSION N/A 03/18/2018   Procedure: CARDIOVERSION;  Surgeon: Larey Dresser, MD;  Location: [facility] ENDOSCOPY;  Service: Cardiovascular;  Laterality: N/A;  . CHOLECYSTECTOMY    . LEFT HEART CATH AND CORONARY ANGIOGRAPHY N/A 02/25/2018   Procedure: LEFT  HEART CATH AND CORONARY ANGIOGRAPHY;  Surgeon: Lorretta Harp, MD;  Location: [place] CV LAB;  Service: Cardiovascular;  Laterality: N/A;  . TEE WITHOUT CARDIOVERSION N/A 03/18/2018   Procedure: TRANSESOPHAGEAL ECHOCARDIOGRAM (TEE);  Surgeon: Larey Dresser, MD;  Location: [facility] ENDOSCOPY;  Service: Cardiovascular;  Laterality: N/A;  . UMBILICAL HERNIA REPAIR       A IV Location/Drains/Wounds Patient Lines/Drains/Airways Status   Active Line/Drains/Airways    Name:   Placement date:   Placement time:   Site:   Days:   Peripheral IV 10/25/18 Left Antecubital   10/25/18    -    Antecubital   10          Intake/Output Last 24 hours No intake or output data in the 24 hours ending 11/04/18 0202  Labs/Imaging Results for orders placed or performed during the hospital encounter of 11/03/18 (from the past 48 hour(s))  CBC with Differential     Status: None   Collection Time: 11/03/18 10:10 PM  Result Value Ref Range   WBC 8.5 4.0 - 10.5 K/uL   RBC 4.61 3.87 - 5.11 MIL/uL   Hemoglobin 13.4 12.0 - 15.0 g/dL   HCT 42.5 36.0 - 46.0 %   MCV 92.2 80.0 - 100.0 fL   MCH 29.1 26.0 - 34.0 pg   MCHC 31.5 30.0 - 36.0 g/dL   RDW 13.6 11.5 - 15.5 %   Platelets 256 150 - 400 K/uL   nRBC 0.0 0.0 - 0.2 %  Neutrophils Relative % 66 %   Neutro Abs 5.7 1.7 - 7.7 K/uL   Lymphocytes Relative 22 %   Lymphs Abs 1.9 0.7 - 4.0 K/uL   Monocytes Relative 7 %   Monocytes Absolute 0.6 0.1 - 1.0 K/uL   Eosinophils Relative 3 %   Eosinophils Absolute 0.2 0.0 - 0.5 K/uL   Basophils Relative 1 %   Basophils Absolute 0.1 0.0 - 0.1 K/uL   Immature Granulocytes 1 %   Abs Immature Granulocytes 0.05 0.00 - 0.07 K/uL    Comment: Performed at [facility] Lab, [address] [place], [place] [address]  Brain natriuretic peptide     Status: None   Collection Time: 11/03/18 10:10 PM  Result Value Ref Range   B Natriuretic Peptide 24.3 0.0 - 100.0 pg/mL    Comment: Performed at [facility] Lab,  [address], [place], [place] [address]  Troponin I - Once     Status: None   Collection Time: 11/03/18 10:10 PM  Result Value Ref Range   Troponin I <0.03 <0.03 ng/mL    Comment: Performed at [facility], [address], [place], [place] [address]  Basic metabolic panel     Status: Abnormal   Collection Time: 11/03/18 10:10 PM  Result Value Ref Range   Sodium 138 135 - 145 mmol/L   Potassium 3.3 (L) 3.5 - 5.1 mmol/L   Chloride 100 98 - 111 mmol/L   CO2 26 22 - 32 mmol/L   Glucose, Bld 87 70 - 99 mg/dL   BUN 9 8 - 23 mg/dL   Creatinine, Ser 6.00 (H) 0.44 - 1.00 mg/dL   Calcium 9.4 8.9 - 45.9 mg/dL   GFR calc non Af Amer 43 (L) >60 mL/min   GFR calc Af Amer 50 (L) >60 mL/min   Anion gap 12 5 - 15    Comment: Performed at [facility] Lab, [address], [place], [place] [address]  Magnesium     Status: None   Collection Time: 11/03/18 10:10 PM  Result Value Ref Range   Magnesium 2.1 1.7 - 2.4 mg/dL    Comment: Performed at [facility] Lab, [address], [place], [place] [address]  SARS Coronavirus 2     Status: None   Collection Time: 11/03/18 11:48 PM  Result Value Ref Range   SARS Coronavirus 2 NOT DETECTED NOT DETECTED    Comment: (NOTE) SARS-CoV-2 target nucleic acids are NOT DETECTED. The SARS-CoV-2 RNA is generally detectable in upper and lower respiratory specimens during the acute phase of infection.  Negative  results do not preclude SARS-CoV-2 infection, do not rule out co-infections with other pathogens, and should not be used as the sole basis for treatment or other patient management decisions.  Negative results must be combined with clinical observations, patient history, and epidemiological information. The expected result is Not Detected. Fact Sheet for Patients: http://www.biofiredefense.com/wp-content/uploads/2020/03/BIOFIRE-COVID -19-patients.pdf Fact Sheet for Healthcare  Providers: http://www.biofiredefense.com/wp-content/uploads/2020/03/BIOFIRE-COVID -19-hcp.pdf This test is not yet approved or cleared by the [place] and  has been authorized for detection and/or diagnosis of SARS-CoV-2 by FDA under an Emergency Use Authorization (EUA).  This EUA will remain in effec t (meaning this test can be used) for the duration of  the COVID-19 declaration under Section 564(b)(1) of the Act, 21 U.S.C. section 360bbb-3(b)(1), unless the authorization is terminated or revoked sooner. Performed at [facility] Lab, [address], [place], [place] [address]  Dg Chest Port 1 View  Result Date: 11/03/2018 CLINICAL DATA:  Intermittent chest pressure for several weeks. Pain is worse today. EXAM: PORTABLE CHEST 1 VIEW COMPARISON:  Two-view chest x-ray 10/25/2018 FINDINGS: The heart size is normal. Linear airspace opacities are present both bases. There is mild pulmonary vascular congestion without frank edema. Small effusions are likely present. The visualized soft tissues and bony thorax are unremarkable. IMPRESSION: 1. Cardiomegaly and mild pulmonary vascular congestion. 2. Bibasilar airspace disease has increased. While this may represent atelectasis, infection is not excluded. Electronically Signed   By: Marin Robertshristopher  Mattern M.D.   On: 11/03/2018 21:35    Pending Labs Unresulted Labs (From admission, onward)    Start     Ordered   Signed and Held  Basic metabolic panel  Daily,   R     Signed and Held   Signed and Held  TSH  Add-on,   R     Signed and Held          Vitals/Pain Today's Vitals   11/03/18 2330 11/04/18 0038 11/04/18 0100 11/04/18 0201  BP: 106/64 (!) 117/55 119/65   Pulse: (!) 59 (!) 54 (!) 51   Resp: 20 17 17    Temp:      SpO2: 94% 100% 96%   Weight:      Height:      PainSc:    0-No pain    Isolation Precautions No active isolations  Medications Medications  potassium chloride SA (K-DUR) CR tablet 40 mEq (has no  administration in time range)    Mobility walks Low fall risk   Focused Assessments Cardiac Assessment Handoff:  Cardiac Rhythm: Normal sinus rhythm Lab Results  Component Value Date   TROPONINI <0.03 11/03/2018   No results found for: DDIMER Does the Patient currently have chest pain? No     R Recommendations: See Admitting Provider Note  Report given to:   Additional Notes:

## 2018-11-04 NOTE — Progress Notes (Signed)
I responded to a [place] to provide Advance Directive for the patient. I visited the patient's room and provided an overview of the AD.The patient had no questions. I left the AD for her to complete at a later time. I shared words of encouragement. I shared that the Chaplain is available for additional support as needed or requested.    11/04/18 0919  Clinical Encounter Type  Visited With Patient  Visit Type Spiritual support  Referral From Nurse  Consult/Referral To Chaplain  Spiritual Encounters  Spiritual Needs Prayer;Literature    Chaplain Dr Redgie Grayer

## 2018-11-04 NOTE — Consult Note (Addendum)
Cardiology Consultation:   Patient ID: Carrie BockBeverly Walters MRN: [id number]; DOB: [birth year]  Admit date: 11/03/2018 Date of Consult: 11/04/2018  Primary Care Provider: SwazilandJordan, Sarah T, MD Primary Cardiologist: Garwin Brothersajan R Revankar, MD  AHF: Dr. Shirlee LatchMcLean Primary Electrophysiologist:  Will Jorja LoaMartin Camnitz, MD    Patient Profile:   Carrie BockBeverly Walters is a 63 y.o. female with a hx of AFib (historically has been described as chronic), hypothyroidism, HTN, morbid obesity, asthma, NICM, VF arrest who is being seen today for the evaluation of life vest tone, consider ICD implant at the request of Dr. Liane Comberarnicelli.  History of Present Illness:   Carrie. Carrie Walters initial VF arrest was in Oct 2019, atthat time cath showed normal coronaries and TTE with LVEF 20%, she had porrly healing LE wounds/ulcers and ICD was deferred utnil healed to reduce infection risk, she was placed with Life Vest.  March 2020 she had a VF event, identified and treated appropriately by her Life vest, TTE at this time noted recovered LVEF, ICD again deferred 2/2 LE wounds as well as lice at that time.  Subsequently the COVID19 pandemic broke and plans for implant have yet again been delayed.  She had a virtual visit with Dr. Elberta Fortisamnitz 10/21/2018, with reports of healed LE wound by the patient and treated for her lice (back in March), planned for an in-clinic visit once COVID restrictions allowed to evaluate thepatient and plan for implant.  Yesterday the patient reports that her life vest toned and she was worried she was going to get shocked.  She had no symptoms, no CP, palpitations, no dizziness, and she deferred the therapy, though called for EMS concerned given she has been shocked before.  EMS record is not available, though reportedly toned again with them and was transported.  She will occassionally feel some CP, she is known to have normal coronaries, and pending re-evaluation of a known hietal hernia apparently.  The patient reports in March she had  her hair nearly shaved off and full treatment for lice, no other household members had.  And she has not had any itching, symptoms of re-infection with lice.  LABS K+ 3.3 (replaced) Mag 2.1 BUN/Creat 9/1.31 Trop I: <0.03 WBC 8.5 H/H 13/42 Plts 256 TSH 5.126   Past Medical History:  Diagnosis Date  . Anxiety   . Arthritis   . Asthma   . Atrial fibrillation ([facility])   . Cellulitis   . Dysrhythmia   . Hypertension   . Hypothyroidism   . MI (myocardial infarction) [facility](H[facility])     Past Surgical History:  Procedure Laterality Date  . CARDIOVERSION N/A 03/18/2018   Procedure: CARDIOVERSION;  Surgeon: Laurey MoraleMcLean, Dalton S, MD;  [facility];  Service: Cardiovascular;  Laterality: N/A;  . CHOLECYSTECTOMY    . LEFT HEART CATH AND CORONARY ANGIOGRAPHY N/A 02/25/2018   Procedure: LEFT HEART CATH AND CORONARY ANGIOGRAPHY;  Surgeon: Runell GessBerry, Jonathan J, MD;  Location: [facility] INVASIVE CV LAB;  Service: Cardiovascular;  Laterality: N/A;  . TEE WITHOUT CARDIOVERSION N/A 03/18/2018   Procedure: TRANSESOPHAGEAL ECHOCARDIOGRAM (TEE);  Surgeon: Laurey MoraleMcLean, Dalton S, MD;  [facility];  Service: Cardiovascular;  Laterality: N/A;  . UMBILICAL HERNIA REPAIR       Home Medications:  Prior to Admission medications   Medication Sig Start Date End Date Taking? Authorizing Provider  albuterol (PROVENTIL HFA;VENTOLIN HFA) 108 (90 Base) MCG/ACT inhaler Inhale 2 puffs into the lungs every 6 (six) hours as needed for wheezing or shortness of breath.    [provider]  amiodarone (PACERONE) 200 MG tablet Take 1 tablet (200 mg total) by mouth at bedtime. 09/11/18   Clegg, Amy D, NP  apixaban (ELIQUIS) 5 MG TABS tablet Take 1 tablet (5 mg total) by mouth 2 (two) times daily. 04/02/18   [name], MD  bisoprolol (ZEBETA) 5 MG tablet Take 1 tablet (5 mg total) by mouth daily. 07/26/18   Clegg, Amy D, NP  furosemide (LASIX) 40 MG tablet Take 1 tablet (40 mg total) by mouth 2 (two) times daily. TAKE  ADDITIONAL 1/2 TAB FOR EDEMA OR DYSPNEA Patient taking differently: Take 40 mg by mouth See admin instructions. TAKE ONE TABLET (40MG ) TWICE DAILY, MAY TAKE ADDITIONAL 1/2 TAB (20MG ) FOR EDEMA OR DYSPNEA. 10/07/18   Clegg, Amy D, NP  guaiFENesin (MUCINEX) 600 MG 12 hr tablet Take 1 tablet (600 mg total) by mouth 2 (two) times daily as needed. 10/25/18   Sabas Sous, MD  KLOR-CON M20 20 MEQ tablet TAKE 1 TABLET BY MOUTH EVERY DAY Patient taking differently: Take 20 mEq by mouth daily.  10/17/18   [name], MD  levothyroxine (SYNTHROID, LEVOTHROID) 50 MCG tablet Take 1 tablet (50 mcg total) by mouth daily before breakfast. 07/27/18   Clegg, Amy D, NP  LORazepam (ATIVAN) 1 MG tablet Take 1 tablet (1 mg total) by mouth every 8 (eight) hours as needed for anxiety. 10/16/18   Zadie Rhine, MD  montelukast (SINGULAIR) 10 MG tablet Take 10 mg by mouth at bedtime.    [provider]  omeprazole (PRILOSEC) 40 MG capsule Take 40 mg by mouth daily.    [provider]  pantoprazole (PROTONIX) 40 MG tablet Take 1 tablet (40 mg total) by mouth daily. Patient not taking: Reported on 10/21/2018 10/09/18   Pricilla Loveless, MD  spironolactone (ALDACTONE) 25 MG tablet Take 1 tablet (25 mg total) by mouth daily. 09/24/18   Clegg, Amy D, NP  sucralfate (CARAFATE) 1 g tablet Take 1 tablet (1 g total) by mouth 4 (four) times daily as needed. 10/25/18   Sabas Sous, MD    Inpatient Medications: Scheduled Meds: . amiodarone  200 mg Oral QHS  . apixaban  5 mg Oral BID  . bisoprolol  5 mg Oral Daily  . furosemide  40 mg Oral BID  . levothyroxine  50 mcg Oral Q0600  . mouth rinse  15 mL Mouth Rinse BID  . montelukast  10 mg Oral QHS  . pantoprazole  80 mg Oral Daily  . potassium chloride SA  20 mEq Oral Daily  . sodium chloride flush  3 mL Intravenous Q12H  . spironolactone  25 mg Oral Daily   Continuous Infusions: . sodium chloride     PRN Meds: sodium chloride, acetaminophen, albuterol,  guaiFENesin, LORazepam, ondansetron (ZOFRAN) IV, sodium chloride flush, sucralfate  Allergies:    Allergies  Allergen Reactions  . Metoprolol Tartrate Shortness Of Breath  . Biaxin [Clarithromycin] Other (See Comments)    Sores in mouth  . Levaquin [Levofloxacin] Other (See Comments)    Sores in mouth  . Contrast Media [Iodinated Diagnostic Agents] Rash    Social History:   Social History   Socioeconomic History  . Marital status: Single    Spouse name: Not on file  . Number of children: 1  . Years of education: Not on file  . Highest education level: 12th grade  Occupational History  . Not on file  Social Needs  . Financial resource strain: Somewhat hard  .  Food insecurity    Worry: Never true    Inability: Never true  . Transportation needs    Medical: Yes    Non-medical: No  Tobacco Use  . Smoking status: Former Research scientist (life sciences)  . Smokeless tobacco: Never Used  Substance and Sexual Activity  . Alcohol use: Never    Frequency: Never  . Drug use: Never  . Sexual activity: Not on file  Lifestyle  . Physical activity    Days per week: Not on file    Minutes per session: Not on file  . Stress: Not on file  Relationships  . Social Herbalist on phone: Not on file    Gets together: Not on file    Attends religious service: Not on file    Active member of club or organization: Not on file    Attends meetings of clubs or organizations: Not on file    Relationship status: Not on file  . Intimate partner violence    Fear of current or ex partner: Not on file    Emotionally abused: Not on file    Physically abused: Not on file    Forced sexual activity: Not on file  Other Topics Concern  . Not on file  Social History Narrative   Moved from [place]    Family History:   Family History  Problem Relation Age of Onset  . Heart disease Father   . Atrial fibrillation Brother      ROS:  Please see the history of present illness.  All other ROS reviewed and  negative.     Physical Exam/Data:   Vitals:   11/04/18 0130 11/04/18 0243 11/04/18 0556 11/04/18 0953  BP: (!) 106/54 119/82 125/70 103/60  Pulse: (!) 51 (!) 55 (!) 55 (!) 52  Resp: 17 18 18 16   Temp:  98.2 F (36.8 C) 97.7 F (36.5 C) 97.9 F (36.6 C)  TempSrc:  Oral Oral Oral  SpO2: 99% 100% 100% 100%  Weight:  117.6 kg    Height:  5\' 6"  (1.676 m)      Intake/Output Summary (Last 24 hours) at 11/04/2018 1410 Last data filed at 11/04/2018 1230 Gross per 24 hour  Intake 720 ml  Output 1650 ml  Net -930 ml   Last 3 Weights 11/04/2018 11/03/2018 10/25/2018  Weight (lbs) 259 lb 3.2 oz 250 lb 260 lb  Weight (kg) 117.572 kg 113.399 kg 117.935 kg     Body mass index is 41.84 kg/m.  General:  Well nourished, well developed, in no acute distress HEENT: normal, I do not note any lice Lymph: no adenopathy Neck: no JVD Endocrine:  No thryomegaly Vascular: No carotid bruits  Cardiac:  RRR; no murmurs, gallops or rubs Lungs:  CTA b/l, no wheezing, rhonchi or rales  Abd: soft, nontender  Ext: trace-1+ edema, erythema to b/l LE, L>R, no open wounds, very small superficial scratches, scabs Musculoskeletal:  No obvious deformities Skin: warm and dry  Neuro:  no focal abnormalities noted Psych:  Normal affect   EKG:  The EKG was personally reviewed and demonstrates:   SR, no ischemic changes Telemetry:  Telemetry was personally reviewed and demonstrates:   SB/SR generally 50's-60's  Relevant CV Studies:  07/25/2018: TTE IMPRESSIONS 1. Limited study for LVF done with definity.  2. The left ventricle has normal systolic function, with an ejection fraction of 60-65%. No evidence of left ventricular regional wall motion abnormalities.  FINDINGS  Left Ventricle: The left ventricle has  normal systolic function, with an ejection fraction of 60-65%. No evidence of left ventricular regional wall motion abnormalities.  02/25/18: [facility] IMPRESSION:Carrie Walters has normal coronary arteries and an  LVEDP of 23.  Unfortunately, I could not cannulate the brachial vein and therefore did not do a right heart cath.  The radial artery sheath was removed and a TR band was placed on the right wrist to achieve patent hemostasis.  The patient left the lab in stable condition.  She will be treated for nonischemic car myopathy with pharmacologic optimization.  Given her presentation as cardiac arrest and ventricular fibrillation she will likely need an ICD placed prior to discharge.  02/22/18: TTE Study Conclusions - HPI and indications: Atrial fibrillation. - Left ventricle: The cavity size was moderately dilated. Wall   thickness was increased in a pattern of mild LVH. Systolic   function was severely reduced. The estimated ejection fraction   was in the range of 25% to 30%. Diffuse hypokinesis.  Impressions: - Limited echo. A-fib is noted. LVEF 25-30%, mild LVH, moderately   dilated LV, LV mid-ventricle false tendon, severe global   hypokinesis.  Laboratory Data:  Chemistry Recent Labs  Lab 11/03/18 2210  NA 138  K 3.3*  CL 100  CO2 26  GLUCOSE 87  BUN 9  CREATININE 1.31*  CALCIUM 9.4  GFRNONAA 43*  GFRAA 50*  ANIONGAP 12    No results for input(s): PROT, ALBUMIN, AST, ALT, ALKPHOS, BILITOT in the last 168 hours. Hematology Recent Labs  Lab 11/03/18 2210  WBC 8.5  RBC 4.61  HGB 13.4  HCT 42.5  MCV 92.2  MCH 29.1  MCHC 31.5  RDW 13.6  PLT 256   Cardiac Enzymes Recent Labs  Lab 11/03/18 2210  TROPONINI <0.03   No results for input(s): TROPIPOC in the last 168 hours.  BNP Recent Labs  Lab 11/03/18 2210  BNP 24.3    DDimer No results for input(s): DDIMER in the last 168 hours.  Radiology/Studies:   Dg Chest Port 1 View Result Date: 11/03/2018 CLINICAL DATA:  Intermittent chest pressure for several weeks. Pain is worse today. EXAM: PORTABLE CHEST 1 VIEW COMPARISON:  Two-view chest x-ray 10/25/2018 FINDINGS: The heart size is normal. Linear airspace opacities  are present both bases. There is mild pulmonary vascular congestion without frank edema. Small effusions are likely present. The visualized soft tissues and bony thorax are unremarkable. IMPRESSION: 1. Cardiomegaly and mild pulmonary vascular congestion. 2. Bibasilar airspace disease has increased. While this may represent atelectasis, infection is not excluded. Electronically Signed   By: Marin Roberts M.D.   On: 11/03/2018 21:35    Assessment and Plan:   1. Zoll life vest alarm (x2) reported     [employer] rep came and interrogated the device, no arrhythmias or observations.  May have alarmed for poor connection with the patient  2. 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 I do not appreciate evidence of lice, I do not see any open, significant skin wounds on her legs Dr. Ladona Ridgel will see the patient later today Will hold Eliquis tonight if agrees  to plan for ICD tomorrow   3. Hypokalemia on admission     Replaced     She may need higher home replacement dose  4. AFib     She is in SR today     CHA2DS2Vasc is 3, on Eliquis, appropriately dosed     Hold tonight for procedure tomorrow   For questions or updates, please contact [facility] Please consult [email or web address] for contact info under     Signed, Sheilah PigeonRenee Lynn Ursuy, PA-C  11/04/2018 2:10 PM  EP Attending  Patient seen and examined. Agree with the findings as noted above. The patient presented with her Life vest alarming. She did not have an arrhythmia. She has a h/o 2 episodes of VF. She developed a skin infection which has been slow to heal but finally done so. She saw Dr. Derek JackWC and was pending ICD  insertion as soon as our schedule allowed. She has requested to have her procedure done while in the hospital. She denies fever or chills. We will try and get this done tomorrow. Dr. Fawn KirkJA is in the hospital. I will discuss with him.  Leonia ReevesGregg Taylor,M.D.

## 2018-11-04 NOTE — Progress Notes (Signed)
Pt arrived to unit to unit, complains of SHOB- will give neb. Cardiac monitoring initiated and verified, life vest remains on. Will continue to monitor. RN and NT saw what looked like a bedbug, did not get a chance to capture.

## 2018-11-04 NOTE — H&P (Signed)
CARDIOLOGY H&P  HPI: Carrie Walters is a 63 y.o. female w/ history of AF (on apixaban), hypothyroidism, COPD, HTN, morbid obesity, asthma, and VF arrest in October 2019 who presents with LifeVest alarm.  Patient at home earlier today when she heard her lifevest alarm noting that she would be shocked. She disarmed the vest before it could shock her and called EMS. This same thing happened again when EMS was there. She denies any symptoms of chest pain, chest pressure, SOB, palpitations, dizziness, lightheadedness around these episodes. She has chronic symptoms of chest/abdominal pressure that she attributes to a hernia. This has been unchanged lately. She has been taking all of her medications as prescribed without any issues.  She has not noticed any recent weight gain or weight loss.  Of note, the patient has been followed by Dr. Elberta Fortisamnitz since her arrest 8 months ago.  She suffered from a prolonged case of lower extremity cellulitis, which significantly delayed plans for ICD implant.  She was last seen by tele-visit several weeks ago with plans for an in person visit and subsequent ICD placement.  Review of Systems:     Cardiac Review of Systems: {Y] = yes [ ]  = no  Chest Pain [Y]  Resting SOB [   ] Exertional SOB  [Y]  Orthopnea [  ]   Pedal Edema [   ]    Palpitations [  ] Syncope  [  ]   Presyncope [   ]  General Review of Systems: [Y] = yes [  ]=no Constitional: recent weight change [  ]; anorexia [  ]; fatigue [  ]; nausea [  ]; night sweats [  ]; fever [  ]; or chills [  ];                                                                     Dental: poor dentition[  ];   Eye : blurred vision [  ]; diplopia [   ]; vision changes [  ];  Amaurosis fugax[  ]; Resp: cough [  ];  wheezing[  ];  hemoptysis[  ]; shortness of breath[  ]; paroxysmal nocturnal dyspnea[  ]; dyspnea on exertion[  ]; or orthopnea[  ];  GI:  gallstones[  ], vomiting[  ];  dysphagia[  ]; melena[  ];  hematochezia [  ];  heartburn[  ];   GU: kidney stones [  ]; hematuria[  ];   dysuria [  ];  nocturia[  ];               Skin: rash [  ], swelling[  ];, hair loss[  ];  peripheral edema[  ];  or itching[  ]; Musculosketetal: myalgias[  ];  joint swelling[  ];  joint erythema[  ];  joint pain[  ];  back pain[  ];  Heme/Lymph: bruising[  ];  bleeding[  ];  anemia[  ];  Neuro: TIA[  ];  headaches[  ];  stroke[  ];  vertigo[  ];  seizures[  ];   paresthesias[  ];  difficulty walking[  ];  Psych:depression[  ]; anxiety[  ];  Endocrine: diabetes[  ];  thyroid dysfunction[Y];  Other:  Past Medical History:  Diagnosis Date   Anxiety    Arthritis    Asthma    Atrial fibrillation (HCC)    Cellulitis    Dysrhythmia    Hypertension    Hypothyroidism    MI (myocardial infarction) (HCC)     Prior to Admission medications   Medication Sig Start Date End Date Taking? Authorizing Provider  albuterol (PROVENTIL HFA;VENTOLIN HFA) 108 (90 Base) MCG/ACT inhaler Inhale 2 puffs into the lungs every 6 (six) hours as needed for wheezing or shortness of breath.    [provider]  amiodarone (PACERONE) 200 MG tablet Take 1 tablet (200 mg total) by mouth at bedtime. 09/11/18   Clegg, Amy D, NP  apixaban (ELIQUIS) 5 MG TABS tablet Take 1 tablet (5 mg total) by mouth 2 (two) times daily. 04/02/18   [name], MD  bisoprolol (ZEBETA) 5 MG tablet Take 1 tablet (5 mg total) by mouth daily. 07/26/18   Clegg, Amy D, NP  furosemide (LASIX) 40 MG tablet Take 1 tablet (40 mg total) by mouth 2 (two) times daily. TAKE ADDITIONAL 1/2 TAB FOR EDEMA OR DYSPNEA Patient taking differently: Take 40 mg by mouth See admin instructions. TAKE ONE TABLET (40MG ) TWICE DAILY, MAY TAKE ADDITIONAL 1/2 TAB (20MG ) FOR EDEMA OR DYSPNEA. 10/07/18   Clegg, Amy D, NP  guaiFENesin (MUCINEX) 600 MG 12 hr tablet Take 1 tablet (600 mg total) by mouth 2 (two) times daily as needed. 10/25/18   Sabas Sous, MD  KLOR-CON M20 20 MEQ tablet TAKE 1  TABLET BY MOUTH EVERY DAY Patient taking differently: Take 20 mEq by mouth daily.  10/17/18   [name], MD  levothyroxine (SYNTHROID, LEVOTHROID) 50 MCG tablet Take 1 tablet (50 mcg total) by mouth daily before breakfast. 07/27/18   Clegg, Amy D, NP  LORazepam (ATIVAN) 1 MG tablet Take 1 tablet (1 mg total) by mouth every 8 (eight) hours as needed for anxiety. 10/16/18   Zadie Rhine, MD  montelukast (SINGULAIR) 10 MG tablet Take 10 mg by mouth at bedtime.    [provider]  omeprazole (PRILOSEC) 40 MG capsule Take 40 mg by mouth daily.    [provider]  pantoprazole (PROTONIX) 40 MG tablet Take 1 tablet (40 mg total) by mouth daily. Patient not taking: Reported on 10/21/2018 10/09/18   Pricilla Loveless, MD  spironolactone (ALDACTONE) 25 MG tablet Take 1 tablet (25 mg total) by mouth daily. 09/24/18   Clegg, Amy D, NP  sucralfate (CARAFATE) 1 g tablet Take 1 tablet (1 g total) by mouth 4 (four) times daily as needed. 10/25/18   Sabas Sous, MD    Allergies  Allergen Reactions   Metoprolol Tartrate Shortness Of Breath   Biaxin [Clarithromycin] Other (See Comments)    Sores in mouth   Levaquin [Levofloxacin] Other (See Comments)    Sores in mouth   Contrast Media [Iodinated Diagnostic Agents] Rash    Social History   Socioeconomic History   Marital status: Single    Spouse name: Not on file   Number of children: 1   Years of education: Not on file   Highest education level: 12th grade  Occupational History   Not on file  Social Needs   Financial resource strain: Somewhat hard   Food insecurity    Worry: Never true    Inability: Never true   Transportation needs    Medical: Yes    Non-medical: No  Tobacco Use   Smoking status: Former Smoker  Smokeless tobacco: Never Used  Substance and Sexual Activity   Alcohol use: Never    Frequency: Never   Drug use: Never   Sexual activity: Not on file  Lifestyle   Physical activity     Days per week: Not on file    Minutes per session: Not on file   Stress: Not on file  Relationships   Social connections    Talks on phone: Not on file    Gets together: Not on file    Attends religious service: Not on file    Active member of club or organization: Not on file    Attends meetings of clubs or organizations: Not on file    Relationship status: Not on file   Intimate partner violence    Fear of current or ex partner: Not on file    Emotionally abused: Not on file    Physically abused: Not on file    Forced sexual activity: Not on file  Other Topics Concern   Not on file  Social History Narrative   Moved from [place] History  Problem Relation Age of Onset   Heart disease Father    Atrial fibrillation Brother     PHYSICAL EXAM: Vitals:   11/04/18 0038 11/04/18 0100  BP: (!) 117/55 119/65  Pulse: (!) 54 (!) 51  Resp: 17 17  Temp:    SpO2: 100% 96%   General: Obese.  Pleasant. HEENT: normal Neck: supple.  Unable to appreciate JVP. Cor: PMI nondisplaced.  Distant heart sounds.  Irregularly irregular rhythm. Lungs: clear to auscultation bilaterally, decreased air movement throughout Abdomen: soft, nontender, obese, nondistended.  Extremities: Blanching erythematous rash to just below the knee bilaterally with superficial scaling Neuro: alert & oriented x 3, cranial nerves grossly intact. moves all 4 extremities w/o difficulty. Affect pleasant.  ECG: Normal sinus rhythm, heart rate 61 bpm, interventricular conduction delay, nonspecific ST and T wave abnormalities, unchanged from prior ECG  Results for orders placed or performed during the hospital encounter of 11/03/18 (from the past 24 hour(s))  CBC with Differential     Status: None   Collection Time: 11/03/18 10:10 PM  Result Value Ref Range   WBC 8.5 4.0 - 10.5 K/uL   RBC 4.61 3.87 - 5.11 MIL/uL   Hemoglobin 13.4 12.0 - 15.0 g/dL   HCT 42.5 36.0 - 46.0 %   MCV 92.2 80.0 - 100.0 fL    MCH 29.1 26.0 - 34.0 pg   MCHC 31.5 30.0 - 36.0 g/dL   RDW 13.6 11.5 - 15.5 %   Platelets 256 150 - 400 K/uL   nRBC 0.0 0.0 - 0.2 %   Neutrophils Relative % 66 %   Neutro Abs 5.7 1.7 - 7.7 K/uL   Lymphocytes Relative 22 %   Lymphs Abs 1.9 0.7 - 4.0 K/uL   Monocytes Relative 7 %   Monocytes Absolute 0.6 0.1 - 1.0 K/uL   Eosinophils Relative 3 %   Eosinophils Absolute 0.2 0.0 - 0.5 K/uL   Basophils Relative 1 %   Basophils Absolute 0.1 0.0 - 0.1 K/uL   Immature Granulocytes 1 %   Abs Immature Granulocytes 0.05 0.00 - 0.07 K/uL  Brain natriuretic peptide     Status: None   Collection Time: 11/03/18 10:10 PM  Result Value Ref Range   B Natriuretic Peptide 24.3 0.0 - 100.0 pg/mL  Troponin I - Once     Status: None   Collection Time: 11/03/18  10:10 PM  Result Value Ref Range   Troponin I <0.03 <0.03 ng/mL  Basic metabolic panel     Status: Abnormal   Collection Time: 11/03/18 10:10 PM  Result Value Ref Range   Sodium 138 135 - 145 mmol/L   Potassium 3.3 (L) 3.5 - 5.1 mmol/L   Chloride 100 98 - 111 mmol/L   CO2 26 22 - 32 mmol/L   Glucose, Bld 87 70 - 99 mg/dL   BUN 9 8 - 23 mg/dL   Creatinine, Ser 1.611.31 (H) 0.44 - 1.00 mg/dL   Calcium 9.4 8.9 - 09.610.3 mg/dL   GFR calc non Af Amer 43 (L) >60 mL/min   GFR calc Af Amer 50 (L) >60 mL/min   Anion gap 12 5 - 15  Magnesium     Status: None   Collection Time: 11/03/18 10:10 PM  Result Value Ref Range   Magnesium 2.1 1.7 - 2.4 mg/dL   Dg Chest Port 1 View  Result Date: 11/03/2018 CLINICAL DATA:  Intermittent chest pressure for several weeks. Pain is worse today. EXAM: PORTABLE CHEST 1 VIEW COMPARISON:  Two-view chest x-ray 10/25/2018 FINDINGS: The heart size is normal. Linear airspace opacities are present both bases. There is mild pulmonary vascular congestion without frank edema. Small effusions are likely present. The visualized soft tissues and bony thorax are unremarkable. IMPRESSION: 1. Cardiomegaly and mild pulmonary vascular  congestion. 2. Bibasilar airspace disease has increased. While this may represent atelectasis, infection is not excluded. Electronically Signed   By: Marin Robertshristopher  Mattern M.D.   On: 11/03/2018 21:35    ASSESSMENT: Carrie Walters is a 63 y.o. female w/ history of AF (on apixaban), hypothyroidism, COPD, HTN, morbid obesity, asthma, and VF arrest in October 2019 who presents with LifeVest alarm and aborted shocks x2.   LifeVest was attempted to be interrogated in the ED, however this was unsuccessful given some issue with the vest device.  The LifeVest representative will need to be contacted in the morning to discuss how this can be resolved so that the vest can be properly interrogated.  The patient does not appear to have signs or symptoms of acute decompensated heart failure at this time.  She does have hypokalemia with a potassium of 3.3.  It is possible that she had an arrhythmia in the setting of hypokalemia, however the patient states that she had no symptoms prior to or during either episode when her LifeVest was nearing discharge.  It is also very possible that this could have been an inappropriate aborted discharge.  Now that the patient's lower extremity cellulitis has resolved and it appears as though Dr. Elberta Fortisamnitz was planning on ICD implant in the near future, the question remains whether the patient might be able to have an ICD implanted during her current hospitalization.  PLAN/DISCUSSION: - contact LifeVest rep in AM for interrogation - supplement potassium - continue home medications - EP to see while inpatient for consideration of ICD implant during hospitalization  Rosario JacksAnthony Philip Rosanne Wohlfarth, MD Cardiology Fellow, PGY-6

## 2018-11-04 NOTE — ED Provider Notes (Signed)
1:07 AM Patient care assumed from [facility], PA-C at change of shift.  Patient with history of V. fib arrest with a LifeVest.  She was admitted in March 2020 after her LifeVest fired.  There was no preceding chest pain or shortness of breath, though the patient was found to be in V. tach on this occasion.  She has been experiencing intermittent left-sided chest pain with initial reassuring cardiac work-up.  States that her LifeVest alarmed on 2 occasions today, but inability to interrogate LifeVest while in the ED.  Plan for admission to Cardiology service for observation pending LifeVest interrogation and chest pain r/o.   Antonietta Breach, PA-C 11/04/18 0109    Veryl Speak, MD 11/04/18 [phone]

## 2018-11-05 ENCOUNTER — Inpatient Hospital Stay (HOSPITAL_COMMUNITY)
Admission: EM | Disposition: A | Discharge status: Home / Self Care | Payer: Self-pay | Source: Home / Self Care | Attending: Internal Medicine

## 2018-11-05 HISTORY — PX: ICD IMPLANT: EP1208

## 2018-11-05 LAB — BASIC METABOLIC PANEL
Anion gap: 8 (ref 5–15)
BUN: 9 mg/dL (ref 8–23)
CO2: 29 mmol/L (ref 22–32)
Calcium: 9.6 mg/dL (ref 8.9–10.3)
Chloride: 103 mmol/L (ref 98–111)
Creatinine, Ser: 1.32 mg/dL — ABNORMAL HIGH (ref 0.44–1.00)
GFR calc Af Amer: 50 mL/min — ABNORMAL LOW (ref >60)
GFR calc non Af Amer: 43 mL/min — ABNORMAL LOW (ref >60)
Glucose, Bld: 104 mg/dL — ABNORMAL HIGH (ref 70–99)
Potassium: 3.4 mmol/L — ABNORMAL LOW (ref 3.5–5.1)
Sodium: 140 mmol/L (ref 135–145)

## 2018-11-05 SURGERY — ICD IMPLANT

## 2018-11-05 MED ORDER — ACETAMINOPHEN 325 MG PO TABS: 325.0000 mg | ORAL_TABLET | ORAL | Status: DC | PRN

## 2018-11-05 MED ORDER — FENTANYL CITRATE (PF) 100 MCG/2ML IJ SOLN
INTRAMUSCULAR | Status: DC | PRN
  Administered 2018-11-05: 12.5 ug via INTRAVENOUS
  Administered 2018-11-05: 25 ug via INTRAVENOUS
  Administered 2018-11-05: 12.5 ug via INTRAVENOUS

## 2018-11-05 MED ORDER — ONDANSETRON HCL 4 MG/2ML IJ SOLN: 4.0000 mg | Freq: Four times a day (QID) | INTRAMUSCULAR | Status: DC | PRN

## 2018-11-05 MED ORDER — CEFAZOLIN SODIUM-DEXTROSE 1-4 GM/50ML-% IV SOLN
1.0000 g | Freq: Four times a day (QID) | INTRAVENOUS | Status: AC
  Administered 2018-11-05 – 2018-11-06 (×3): 1 g via INTRAVENOUS
  Filled 2018-11-05 (×3): qty 50

## 2018-11-05 MED ORDER — SODIUM CHLORIDE 0.9% FLUSH
3.0000 mL | Freq: Two times a day (BID) | INTRAVENOUS | Status: DC
  Administered 2018-11-05 – 2018-11-06 (×2): 3 mL via INTRAVENOUS

## 2018-11-05 MED ORDER — FENTANYL CITRATE (PF) 100 MCG/2ML IJ SOLN
INTRAMUSCULAR | Status: AC
  Filled 2018-11-05: qty 2

## 2018-11-05 MED ORDER — SODIUM CHLORIDE 0.9 % IV SOLN: 250.0000 mL | INTRAVENOUS | Status: DC | PRN

## 2018-11-05 MED ORDER — LIDOCAINE HCL 1 % IJ SOLN
INTRAMUSCULAR | Status: AC
  Filled 2018-11-05: qty 20

## 2018-11-05 MED ORDER — METHYLPREDNISOLONE SODIUM SUCC 125 MG IJ SOLR
125.0000 mg | Freq: Once | INTRAMUSCULAR | Status: AC
  Administered 2018-11-05: 125 mg via INTRAVENOUS
  Filled 2018-11-05: qty 2

## 2018-11-05 MED ORDER — LIDOCAINE HCL (PF) 1 % IJ SOLN
INTRAMUSCULAR | Status: DC | PRN
  Administered 2018-11-05: 90 mL

## 2018-11-05 MED ORDER — CEFAZOLIN SODIUM-DEXTROSE 2-4 GM/100ML-% IV SOLN
INTRAVENOUS | Status: AC
  Filled 2018-11-05: qty 100

## 2018-11-05 MED ORDER — SODIUM CHLORIDE 0.9% FLUSH: 3.0000 mL | INTRAVENOUS | Status: DC | PRN

## 2018-11-05 MED ORDER — DIPHENHYDRAMINE HCL 50 MG/ML IJ SOLN
25.0000 mg | Freq: Once | INTRAMUSCULAR | Status: AC
  Administered 2018-11-05: 25 mg via INTRAVENOUS
  Filled 2018-11-05: qty 1

## 2018-11-05 MED ORDER — SODIUM CHLORIDE 0.9 % IV SOLN
INTRAVENOUS | Status: AC
  Filled 2018-11-05: qty 2

## 2018-11-05 MED ORDER — HYDROCODONE-ACETAMINOPHEN 5-325 MG PO TABS
1.0000 | ORAL_TABLET | ORAL | Status: DC | PRN
  Administered 2018-11-06: 1 via ORAL
  Filled 2018-11-05: qty 1

## 2018-11-05 MED ORDER — LIDOCAINE HCL 1 % IJ SOLN
INTRAMUSCULAR | Status: AC
  Filled 2018-11-05: qty 60

## 2018-11-05 MED ORDER — HEPARIN (PORCINE) IN NACL 1000-0.9 UT/500ML-% IV SOLN
INTRAVENOUS | Status: DC | PRN
  Administered 2018-11-05: 500 mL

## 2018-11-05 MED ORDER — MIDAZOLAM HCL 5 MG/5ML IJ SOLN
INTRAMUSCULAR | Status: AC
  Filled 2018-11-05: qty 5

## 2018-11-05 MED ORDER — HEPARIN (PORCINE) IN NACL 1000-0.9 UT/500ML-% IV SOLN
INTRAVENOUS | Status: AC
  Filled 2018-11-05: qty 500

## 2018-11-05 MED ORDER — MIDAZOLAM HCL 5 MG/5ML IJ SOLN
INTRAMUSCULAR | Status: DC | PRN
  Administered 2018-11-05: 1 mg via INTRAVENOUS
  Administered 2018-11-05: 2 mg via INTRAVENOUS
  Administered 2018-11-05: 1 mg via INTRAVENOUS

## 2018-11-05 MED ORDER — POTASSIUM CHLORIDE CRYS ER 20 MEQ PO TBCR
40.0000 meq | EXTENDED_RELEASE_TABLET | Freq: Once | ORAL | Status: AC
  Administered 2018-11-05: 40 meq via ORAL

## 2018-11-05 SURGICAL SUPPLY — 9 items
CABLE SURGICAL S-101-97-12 (CABLE) ×2 IMPLANT
HOVERMATT SINGLE USE (MISCELLANEOUS) ×1 IMPLANT
ICD EVERA DR XT MRI DDMB1D4 (ICD Generator) ×1 IMPLANT
LEAD CAPSURE NOVUS 5076-52CM (Lead) ×1 IMPLANT
LEAD SPRINT QUAT SEC 6935M-62 (Lead) ×1 IMPLANT
PAD PRO RADIOLUCENT 2001M-C (PAD) ×2 IMPLANT
SHEATH CLASSIC 7F (SHEATH) ×1 IMPLANT
SHEATH CLASSIC 9F (SHEATH) ×1 IMPLANT
TRAY PACEMAKER INSERTION (PACKS) ×2 IMPLANT

## 2018-11-05 NOTE — Progress Notes (Addendum)
Progress Note  Patient Name: Carrie BockBeverly Perry Date of Encounter: 11/05/2018  Primary Cardiologist: Garwin Brothersajan R Revankar, MD  AHF: Dr. Shirlee LatchMcLean  Subjective   A little nervous about procedure, otherwise, feels well., No CP or SOB, mentions a post nsal gtt  Inpatient Medications    Scheduled Meds: . amiodarone  200 mg Oral QHS  . bisoprolol  5 mg Oral Daily  . Chlorhexidine Gluconate Cloth  6 each Topical Daily  . furosemide  40 mg Oral BID  . gentamicin irrigation  80 mg Irrigation To Cath  . levothyroxine  50 mcg Oral Q0600  . mouth rinse  15 mL Mouth Rinse BID  . montelukast  10 mg Oral QHS  . mupirocin ointment  1 application Nasal BID  . pantoprazole  80 mg Oral Daily  . potassium chloride SA  20 mEq Oral Daily  . sodium chloride flush  3 mL Intravenous Q12H  . sodium chloride flush  3 mL Intravenous Q12H  . spironolactone  25 mg Oral Daily   Continuous Infusions: . sodium chloride    . sodium chloride 50 mL/hr at 11/05/18 0618  . sodium chloride    .  ceFAZolin (ANCEF) IV     PRN Meds: sodium chloride, acetaminophen, albuterol, albuterol, guaiFENesin, LORazepam, ondansetron (ZOFRAN) IV, sodium chloride flush, sodium chloride flush, sucralfate   Vital Signs    Vitals:   11/04/18 2025 11/05/18 0146 11/05/18 0457 11/05/18 0500  BP: (!) 97/52 96/61 109/62   Pulse: (!) 56 (!) 56 (!) 57   Resp: 18 18 18    Temp: (!) 97.4 F (36.3 C) 97.6 F (36.4 C) 98 F (36.7 C)   TempSrc: Oral  Oral   SpO2: 100% 95% 94%   Weight:    116.1 kg  Height:        Intake/Output Summary (Last 24 hours) at 11/05/2018 0913 Last data filed at 11/05/2018 0900 Gross per 24 hour  Intake 1560 ml  Output 3150 ml  Net -1590 ml   Last 3 Weights 11/05/2018 11/04/2018 11/03/2018  Weight (lbs) 255 lb 14.4 oz 259 lb 3.2 oz 250 lb  Weight (kg) 116.075 kg 117.572 kg 113.399 kg      Telemetry    SB 50's - Personally Reviewed  ECG    No new EKGs - Personally Reviewed  Physical Exam   GEN: No  acute distress.   Neck: No JVD Cardiac: RRR, no murmurs, rubs, or gallops.  Respiratory: CTA b/l. GI: Soft, nontender, non-distended  MS: LR with trace-1+ edema L>R chronically no open/draining wounds, very smal superficial scratches. Neuro:  Nonfocal  Psych: Normal affect   Labs    Chemistry Recent Labs  Lab 11/03/18 2210 11/05/18 0604  NA 138 140  K 3.3* 3.4*  CL 100 103  CO2 26 29  GLUCOSE 87 104*  BUN 9 9  CREATININE 1.31* 1.32*  CALCIUM 9.4 9.6  GFRNONAA 43* 43*  GFRAA 50* 50*  ANIONGAP 12 8     Hematology Recent Labs  Lab 11/03/18 2210  WBC 8.5  RBC 4.61  HGB 13.4  HCT 42.5  MCV 92.2  MCH 29.1  MCHC 31.5  RDW 13.6  PLT 256    Cardiac Enzymes Recent Labs  Lab 11/03/18 2210  TROPONINI <0.03   No results for input(s): TROPIPOC in the last 168 hours.   BNP Recent Labs  Lab 11/03/18 2210  BNP 24.3     DDimer No results for input(s): DDIMER in the last 168 hours.  Radiology    Dg Chest Port 1 View Result Date: 11/03/2018 CLINICAL DATA:  Intermittent chest pressure for several weeks. Pain is worse today. EXAM: PORTABLE CHEST 1 VIEW COMPARISON:  Two-view chest x-ray 10/25/2018 FINDINGS: The heart size is normal. Linear airspace opacities are present both bases. There is mild pulmonary vascular congestion without frank edema. Small effusions are likely present. The visualized soft tissues and bony thorax are unremarkable. IMPRESSION: 1. Cardiomegaly and mild pulmonary vascular congestion. 2. Bibasilar airspace disease has increased. While this may represent atelectasis, infection is not excluded. Electronically Signed   By: San Morelle M.D.   On: 11/03/2018 21:35    Cardiac Studies   07/25/2018: TTE IMPRESSIONS 1. Limited study for LVF done with definity. 2. The left ventricle has normal systolic function, with an ejection fraction of 60-65%. No evidence of left ventricular regional wall motion abnormalities.  FINDINGS Left Ventricle:  The left ventricle has normal systolic function, with an ejection fraction of 60-65%. No evidence of left ventricular regional wall motion abnormalities.  02/25/18: [facility] IMPRESSION:Ms Pacifico has normal coronary arteries and an LVEDP of 23. Unfortunately, I could not cannulate the brachial vein and therefore did not do a right heart cath. The radial artery sheath was removed and a TR band was placed on the right wrist to achieve patent hemostasis. The patient left the lab in stable condition. She will be treated for nonischemic car myopathy with pharmacologic optimization. Given her presentation as cardiac arrest and ventricular fibrillation she will likely need an ICD placed prior to discharge.  02/22/18: TTE Study Conclusions - HPI and indications: Atrial fibrillation. - Left ventricle: The cavity size was moderately dilated. Wall thickness was increased in a pattern of mild LVH. Systolic function was severely reduced. The estimated ejection fraction was in the range of 25% to 30%. Diffuse hypokinesis.  Impressions: - Limited echo. A-fib is noted. LVEF 25-30%, mild LVH, moderately dilated LV, LV mid-ventricle false tendon, severe global hypokinesis.  Patient Profile     63 y.o. female with a hx of AFib (historically has been described as chronic), hypothyroidism, HTN, morbid obesity, asthma, NICM, VF arrest who is being seen today for the evaluation of life vest toning  Assessment & Plan    1. [employer] life vest alarm (x2) reported     [employer] rep came and interrogated the device, no arrhythmias or observations.  May have alarmed for poor connection with the patient  2. Hx of VF arrest      A second event in march (VT with appropriate shock from her life vest) despite normalization of her EF, started on amiodarone at that time     Pending ICD implant electively once infection issues and then COVID restrictions allowed     The patient would like to go ahead while here if possible  to have the ICD implanted     The patient was seen and examined by Dr. Lovena Le yesterday, agreed, this was likely a good window of opportunity for implant without any signs of active infection, felt legs looked OK. She has SB 50's, thought dual chamber device would be needed rather then a SubQ, with likely need for pacing support in the future, especially if we run into her AFib  Implant planned for today with Dr. Rayann Heman I discussed the procedure, potential risks and benefits yesterday with her, she has no follow up questions, remains agreeable to proceed.  Looks forward to not having to have the Life vest any longer Rash with iodine,  she is ordered for pre-op solumedrol and benadryl   3. Hypokalemia on admission     further replacement for today and will likely require titration of her home dose   4. AFib     She remains in SR today     CHA2DS2Vasc is 3, on Eliquis, appropriately dosed     held last night for procedure tomorrow   5. Venous insufficiency     Pt reports her swelling is the best it has been in a long time     Continue her home diuretic     Lungs are clear, no SOB      For questions or updates, please contact [facility] Please consult [email or web address] for contact info under        Signed, Sheilah Pigeon, PA-C  11/05/2018, 9:13 AM     I have seen, examined the patient, and reviewed the above assessment and plan.  Changes to above are made where necessary.  On exam, RRR.   The patient has had recurrent sustained ventricular fibrillation.  No reversible causes have been found.  She meets criteria for ICD implantation for secondary prevention of sudden death.  She has sinus bradycardia observed on telemetry and qt prolongation. The patient has had opportunities to ask questions and have them answered. The patient and I have decided together through a shared decision making process to proceed with ICD at this time.   Risks, benefits, alternatives to ICD  implantation were discussed in detail with the patient today. The patient understands that the risks include but are not limited to bleeding, infection, pneumothorax, perforation, tamponade, vascular damage, renal failure, MI, stroke, death, inappropriate shocks, and lead dislodgement and wishes to proceed.       Co Sign: Hillis Range, MD 11/05/2018 2:40 PM

## 2018-11-05 NOTE — Interval H&P Note (Signed)
History and Physical Interval Note:  11/05/2018 2:43 PM  Carrie Walters  has presented today for surgery, with the diagnosis of vf.  The various methods of treatment have been discussed with the patient and family. After consideration of risks, benefits and other options for treatment, the patient has consented to  Procedure(s): ICD IMPLANT (N/A) as a surgical intervention.  The patient's history has been reviewed, patient examined, no change in status, stable for surgery.  I have reviewed the patient's chart and labs.  Questions were answered to the patient's satisfaction.    ICD Criteria  Current LVEF:60%. Within 12 months prior to implant: Yes   Heart failure history: No  Cardiomyopathy history: No.  Atrial Fibrillation/Atrial Flutter: Yes, Paroxysmal.  Ventricular tachycardia history: Yes, Hemodynamic instability present. VT Type: Sustained Ventricular Tachycardia - Polymorphic.  Cardiac arrest history: Yes, Ventricular Fibrillation.  History of syndromes with risk of sudden death: Yes, Idiopathic/Primary VT/VF  Previous ICD: No.  Current ICD indication: secondary  PPM indication: Yes. Pacing type: Atrial. Greater than 40% RV pacing requirement anticipated. Indication: Sick Sinus Syndrome  Class I or II Bradycardia indication present: Yes  Beta Blocker therapy for 3 or more months: Yes, prescribed.   Ace Inhibitor/ARB therapy for 3 or more months: No, medical reason.  Normal EF.  Medical therapy limited by hypotension.  Thompson Grayer MD, [place] 11/05/2018 2:45 PM

## 2018-11-05 NOTE — Plan of Care (Signed)
  Problem: Education: Goal: Knowledge of General Education information will improve Description: Including pain rating scale, medication(s)/side effects and non-pharmacologic comfort measures Outcome: Progressing   Problem: Health Behavior/Discharge Planning: Goal: Ability to manage health-related needs will improve Outcome: Progressing   Problem: Safety: Goal: Ability to remain free from injury will improve Outcome: Progressing   

## 2018-11-05 NOTE — H&P (View-Only) (Signed)
 Progress Note  Patient Name: Carrie Walters Date of Encounter: 11/05/2018  Primary Cardiologist: Rajan R Revankar, MD  AHF: Dr. McLean  Subjective   A little nervous about procedure, otherwise, feels well., No CP or SOB, mentions a post nsal gtt  Inpatient Medications    Scheduled Meds: . amiodarone  200 mg Oral QHS  . bisoprolol  5 mg Oral Daily  . Chlorhexidine Gluconate Cloth  6 each Topical Daily  . furosemide  40 mg Oral BID  . gentamicin irrigation  80 mg Irrigation To Cath  . levothyroxine  50 mcg Oral Q0600  . mouth rinse  15 mL Mouth Rinse BID  . montelukast  10 mg Oral QHS  . mupirocin ointment  1 application Nasal BID  . pantoprazole  80 mg Oral Daily  . potassium chloride SA  20 mEq Oral Daily  . sodium chloride flush  3 mL Intravenous Q12H  . sodium chloride flush  3 mL Intravenous Q12H  . spironolactone  25 mg Oral Daily   Continuous Infusions: . sodium chloride    . sodium chloride 50 mL/hr at 11/05/18 0618  . sodium chloride    .  ceFAZolin (ANCEF) IV     PRN Meds: sodium chloride, acetaminophen, albuterol, albuterol, guaiFENesin, LORazepam, ondansetron (ZOFRAN) IV, sodium chloride flush, sodium chloride flush, sucralfate   Vital Signs    Vitals:   11/04/18 2025 11/05/18 0146 11/05/18 0457 11/05/18 0500  BP: (!) 97/52 96/61 109/62   Pulse: (!) 56 (!) 56 (!) 57   Resp: 18 18 18   Temp: (!) 97.4 F (36.3 C) 97.6 F (36.4 C) 98 F (36.7 C)   TempSrc: Oral  Oral   SpO2: 100% 95% 94%   Weight:    116.1 kg  Height:        Intake/Output Summary (Last 24 hours) at 11/05/2018 0913 Last data filed at 11/05/2018 0900 Gross per 24 hour  Intake 1560 ml  Output 3150 ml  Net -1590 ml   Last 3 Weights 11/05/2018 11/04/2018 11/03/2018  Weight (lbs) 255 lb 14.4 oz 259 lb 3.2 oz 250 lb  Weight (kg) 116.075 kg 117.572 kg 113.399 kg      Telemetry    SB 50's - Personally Reviewed  ECG    No new EKGs - Personally Reviewed  Physical Exam   GEN: No  acute distress.   Neck: No JVD Cardiac: RRR, no murmurs, rubs, or gallops.  Respiratory: CTA b/l. GI: Soft, nontender, non-distended  MS: LR with trace-1+ edema L>R chronically no open/draining wounds, very smal superficial scratches. Neuro:  Nonfocal  Psych: Normal affect   Labs    Chemistry Recent Labs  Lab 11/03/18 2210 11/05/18 0604  NA 138 140  K 3.3* 3.4*  CL 100 103  CO2 26 29  GLUCOSE 87 104*  BUN 9 9  CREATININE 1.31* 1.32*  CALCIUM 9.4 9.6  GFRNONAA 43* 43*  GFRAA 50* 50*  ANIONGAP 12 8     Hematology Recent Labs  Lab 11/03/18 2210  WBC 8.5  RBC 4.61  HGB 13.4  HCT 42.5  MCV 92.2  MCH 29.1  MCHC 31.5  RDW 13.6  PLT 256    Cardiac Enzymes Recent Labs  Lab 11/03/18 2210  TROPONINI <0.03   No results for input(s): TROPIPOC in the last 168 hours.   BNP Recent Labs  Lab 11/03/18 2210  BNP 24.3     DDimer No results for input(s): DDIMER in the last 168 hours.     Radiology    Dg Chest Port 1 View Result Date: 11/03/2018 CLINICAL DATA:  Intermittent chest pressure for several weeks. Pain is worse today. EXAM: PORTABLE CHEST 1 VIEW COMPARISON:  Two-view chest x-ray 10/25/2018 FINDINGS: The heart size is normal. Linear airspace opacities are present both bases. There is mild pulmonary vascular congestion without frank edema. Small effusions are likely present. The visualized soft tissues and bony thorax are unremarkable. IMPRESSION: 1. Cardiomegaly and mild pulmonary vascular congestion. 2. Bibasilar airspace disease has increased. While this may represent atelectasis, infection is not excluded. Electronically Signed   By: San Morelle M.D.   On: 11/03/2018 21:35    Cardiac Studies   07/25/2018: TTE IMPRESSIONS 1. Limited study for LVF done with definity. 2. The left ventricle has normal systolic function, with an ejection fraction of 60-65%. No evidence of left ventricular regional wall motion abnormalities.  FINDINGS Left Ventricle:  The left ventricle has normal systolic function, with an ejection fraction of 60-65%. No evidence of left ventricular regional wall motion abnormalities.  02/25/18: [facility] IMPRESSION:Ms Pacifico has normal coronary arteries and an LVEDP of 23. Unfortunately, I could not cannulate the brachial vein and therefore did not do a right heart cath. The radial artery sheath was removed and a TR band was placed on the right wrist to achieve patent hemostasis. The patient left the lab in stable condition. She will be treated for nonischemic car myopathy with pharmacologic optimization. Given her presentation as cardiac arrest and ventricular fibrillation she will likely need an ICD placed prior to discharge.  02/22/18: TTE Study Conclusions - HPI and indications: Atrial fibrillation. - Left ventricle: The cavity size was moderately dilated. Wall thickness was increased in a pattern of mild LVH. Systolic function was severely reduced. The estimated ejection fraction was in the range of 25% to 30%. Diffuse hypokinesis.  Impressions: - Limited echo. A-fib is noted. LVEF 25-30%, mild LVH, moderately dilated LV, LV mid-ventricle false tendon, severe global hypokinesis.  Patient Profile     63 y.o. female with a hx of AFib (historically has been described as chronic), hypothyroidism, HTN, morbid obesity, asthma, NICM, VF arrest who is being seen today for the evaluation of life vest toning  Assessment & Plan    1. [employer] life vest alarm (x2) reported     [employer] rep came and interrogated the device, no arrhythmias or observations.  May have alarmed for poor connection with the patient  2. Hx of VF arrest      A second event in march (VT with appropriate shock from her life vest) despite normalization of her EF, started on amiodarone at that time     Pending ICD implant electively once infection issues and then COVID restrictions allowed     The patient would like to go ahead while here if possible  to have the ICD implanted     The patient was seen and examined by Dr. Lovena Le yesterday, agreed, this was likely a good window of opportunity for implant without any signs of active infection, felt legs looked OK. She has SB 50's, thought dual chamber device would be needed rather then a SubQ, with likely need for pacing support in the future, especially if we run into her AFib  Implant planned for today with Dr. Rayann Heman I discussed the procedure, potential risks and benefits yesterday with her, she has no follow up questions, remains agreeable to proceed.  Looks forward to not having to have the Life vest any longer Rash with iodine,  she is ordered for pre-op solumedrol and benadryl   3. Hypokalemia on admission     further replacement for today and will likely require titration of her home dose   4. AFib     She remains in SR today     CHA2DS2Vasc is 3, on Eliquis, appropriately dosed     held last night for procedure tomorrow   5. Venous insufficiency     Pt reports her swelling is the best it has been in a long time     Continue her home diuretic     Lungs are clear, no SOB      For questions or updates, please contact [facility] Please consult [email or web address] for contact info under        Signed, Sheilah Pigeon, PA-C  11/05/2018, 9:13 AM     I have seen, examined the patient, and reviewed the above assessment and plan.  Changes to above are made where necessary.  On exam, RRR.   The patient has had recurrent sustained ventricular fibrillation.  No reversible causes have been found.  She meets criteria for ICD implantation for secondary prevention of sudden death.  She has sinus bradycardia observed on telemetry and qt prolongation. The patient has had opportunities to ask questions and have them answered. The patient and I have decided together through a shared decision making process to proceed with ICD at this time.   Risks, benefits, alternatives to ICD  implantation were discussed in detail with the patient today. The patient understands that the risks include but are not limited to bleeding, infection, pneumothorax, perforation, tamponade, vascular damage, renal failure, MI, stroke, death, inappropriate shocks, and lead dislodgement and wishes to proceed.       Co Sign: Hillis Range, MD 11/05/2018 2:40 PM

## 2018-11-06 ENCOUNTER — Encounter (HOSPITAL_COMMUNITY): Payer: Self-pay | Admitting: Internal Medicine

## 2018-11-06 ENCOUNTER — Inpatient Hospital Stay (HOSPITAL_COMMUNITY): Payer: Medicaid Other

## 2018-11-06 DIAGNOSIS — Z8679 Personal history of other diseases of the circulatory system: Secondary | ICD-10-CM

## 2018-11-06 LAB — BASIC METABOLIC PANEL
Anion gap: 13 (ref 5–15)
BUN: 15 mg/dL (ref 8–23)
CO2: 24 mmol/L (ref 22–32)
Calcium: 10.1 mg/dL (ref 8.9–10.3)
Chloride: 102 mmol/L (ref 98–111)
Creatinine, Ser: 1.27 mg/dL — ABNORMAL HIGH (ref 0.44–1.00)
GFR calc Af Amer: 52 mL/min — ABNORMAL LOW (ref >60)
GFR calc non Af Amer: 45 mL/min — ABNORMAL LOW (ref >60)
Glucose, Bld: 115 mg/dL — ABNORMAL HIGH (ref 70–99)
Potassium: 4.1 mmol/L (ref 3.5–5.1)
Sodium: 139 mmol/L (ref 135–145)

## 2018-11-06 MED ORDER — APIXABAN 5 MG PO TABS: 5.0000 mg | ORAL_TABLET | Freq: Two times a day (BID) | ORAL | 11 refills | Status: DC

## 2018-11-06 MED FILL — Lidocaine HCl Local Inj 1%: INTRAMUSCULAR | Qty: 90 | Status: AC

## 2018-11-06 NOTE — Clinical Social Work Note (Signed)
Pt needs PTAR home. Address confirmed with pt. PTAR forms placed on pt's chart. PTAR called.   [place], [place]

## 2018-11-06 NOTE — Discharge Summary (Addendum)
ELECTROPHYSIOLOGY PROCEDURE DISCHARGE SUMMARY    Patient ID: Carrie Walters,  MRN: [id number], DOB/AGE: [birth year] 63 y.o.  Admit date: 11/03/2018 Discharge date: 11/06/2018  Primary Care Physician: [place]azil[place], Sarah T, MD Primary Cardiologist: Carrie Walters Heart Failure: Carrie Walters Electrophysiologist: Carrie Walters  Primary Discharge Diagnosis:  VF arrest s/p ICD implant this admission  Secondary Discharge Diagnosis:  1.  Persistent atrial fibrillation 2.  Sinus bradycardia 3.  Hypothyroidism 4.  HTN 5.  Morbid obesity 6.  NICM   Allergies  Allergen Reactions  . Metoprolol Tartrate Shortness Of Breath  . Biaxin [Clarithromycin] Other (See Comments)    Sores in mouth  . Levaquin [Levofloxacin] Other (See Comments)    Sores in mouth  . Adhesive [Tape] Rash    Reaction to ekg pads  . Contrast Media [Iodinated Diagnostic Agents] Rash     Procedures This Admission:  1.  Implantation of a MDT dual chamber ICD on 11/05/18 by Dr  Carrie Walters.  The patient received a MDT model number Evera DR ICD with model number 5076 right atrial lead, 6935 right ventricular lead.  DFT's were deferred at time of implant. There were no immediate post procedure complications. 2.  CXR on 11/06/18 demonstrated no pneumothorax status post device implantation.   Brief HPI/Hospital Course:  [name] initial VF arrest was in Oct 2019, atthat time cath showed normal coronaries and TTE with LVEF 20%, she had porrly healing LE wounds/ulcers and ICD was deferred utnil healed to reduce infection risk, she was placed with Life Vest.  March 2020 she had a VF event, identified and treated appropriately by her Life vest, TTE at this time noted recovered LVEF, ICD again deferred 2/2 LE wounds as well as lice at that time.  Subsequently the COVID19 pandemic broke and plans for implant have yet again been delayed.  She had a virtual visit with Dr. Elberta Walters 10/21/2018, with reports of healed LE wound by the patient and treated for  her lice (back in March), planned for an in-clinic visit once COVID restrictions allowed to evaluate thepatient and plan for implant.  The day before admission, the patient reports that her life vest toned and she was worried she was going to get shocked.  She had no symptoms, no CP, palpitations, no dizziness, and she deferred the therapy, though called for EMS concerned given she has been shocked before.  EMS record is not available, though reportedly toned again with them and was transported.  She will occassionally feel some CP, she is known to have normal coronaries, and pending re-evaluation of a known hietal hernia apparently. She was admitted with plans for ICD implant. Risks, benefits, and alternatives to ICD implantation were reviewed with the patient who wished to proceed. The patient was underwent implantation of a MDT dual chamber ICD with details as outlined above. She was monitored on telemetry overnight which demonstrated SR.  Left chest was without hematoma or ecchymosis.  The device was interrogated and found to be functioning normally.  CXR was obtained and demonstrated no pneumothorax status post device implantation.  Wound care, arm mobility, and restrictions were reviewed with the patient.  The patient was examined and considered stable for discharge to home.   The patient's discharge medications include a beta blocker (Bisoprolol). No ACE-I with normalized LV function  Physical Exam: Vitals:   11/06/18 0043 11/06/18 0303 11/06/18 0501 11/06/18 0737  BP: 110/64  106/67 112/71  Pulse: 66  61 60  Resp: 18  18 16   Temp: 98  F (36.7 C)  (!) 97.3 F (36.3 C) 98.1 F (36.7 C)  TempSrc: Oral  Oral Oral  SpO2: 94%  94% 94%  Weight:  115.3 kg    Height:        GEN- The patient is morbidly obese appearing, alert and oriented x 3 today.   HEENT: normocephalic, atraumatic; sclera clear, conjunctiva pink; hearing intact; oropharynx clear; neck supple  Lungs- Clear to ausculation  bilaterally, normal work of breathing.  No wheezes, rales, rhonchi Heart- Regular rate and rhythm  GI- soft, non-tender, non-distended, bowel sounds present  Extremities- no clubbing, cyanosis, or edema  MS- no significant deformity or atrophy Skin- warm and dry, no rash or lesion, left chest without hematoma/ecchymosis Psych- euthymic mood, full affect Neuro- strength and sensation are intact   Labs:   Lab Results  Component Value Date   WBC 8.5 11/03/2018   HGB 13.4 11/03/2018   HCT 42.5 11/03/2018   MCV 92.2 11/03/2018   PLT 256 11/03/2018    Recent Labs  Lab 11/05/18 0604  NA 140  K 3.4*  CL 103  CO2 29  BUN 9  CREATININE 1.32*  CALCIUM 9.6  GLUCOSE 104*    Discharge Medications:  Allergies as of 11/06/2018      Reactions   Metoprolol Tartrate Shortness Of Breath   Biaxin [clarithromycin] Other (See Comments)   Sores in mouth   Levaquin [levofloxacin] Other (See Comments)   Sores in mouth   Adhesive [tape] Rash   Reaction to ekg pads   Contrast Media [iodinated Diagnostic Agents] Rash      Medication List    TAKE these medications   albuterol 108 (90 Base) MCG/ACT inhaler Commonly known as: VENTOLIN HFA Inhale 2 puffs into the lungs every 6 (six) hours as needed for wheezing or shortness of breath.   amiodarone 200 MG tablet Commonly known as: PACERONE Take 1 tablet (200 mg total) by mouth at bedtime.   apixaban 5 MG Tabs tablet Commonly known as: ELIQUIS Take 1 tablet (5 mg total) by mouth 2 (two) times daily. Restart 6/19 pm dose What changed: additional instructions   bisoprolol 5 MG tablet Commonly known as: ZEBETA Take 1 tablet (5 mg total) by mouth daily.   fluticasone 50 MCG/ACT nasal spray Commonly known as: FLONASE Place 1 spray into both nostrils daily as needed for allergies or rhinitis.   furosemide 40 MG tablet Commonly known as: LASIX Take 1 tablet (40 mg total) by mouth 2 (two) times daily. TAKE ADDITIONAL 1/2 TAB FOR EDEMA OR  DYSPNEA What changed: additional instructions   guaiFENesin 600 MG 12 hr tablet Commonly known as: Mucinex Take 1 tablet (600 mg total) by mouth 2 (two) times daily as needed.   Klor-Con M20 20 MEQ tablet Generic drug: potassium chloride SA TAKE 1 TABLET BY MOUTH EVERY DAY What changed: how much to take   levothyroxine 50 MCG tablet Commonly known as: SYNTHROID Take 1 tablet (50 mcg total) by mouth daily before breakfast.   LORazepam 1 MG tablet Commonly known as: Ativan Take 1 tablet (1 mg total) by mouth every 8 (eight) hours as needed for anxiety.   montelukast 10 MG tablet Commonly known as: SINGULAIR Take 10 mg by mouth at bedtime.   pantoprazole 40 MG tablet Commonly known as: Protonix Take 1 tablet (40 mg total) by mouth daily.   spironolactone 25 MG tablet Commonly known as: ALDACTONE Take 1 tablet (25 mg total) by mouth daily.   sucralfate 1  g tablet Commonly known as: Carafate Take 1 tablet (1 g total) by mouth 4 (four) times daily as needed.       Disposition:   Follow-up Information    [facility] Follow up.   Specialty: Cardiology Why: 11/19/2018 @ 10:30AM, wound check visit Contact information: [address], [place] [address] [phone]          Duration of Discharge Encounter: Greater than 30 minutes including physician time.  Signed, Chanetta Marshall, NP 11/06/2018 8:33 AM   I have seen, examined the patient, and reviewed the above assessment and plan.  Changes to above are made where necessary.  On exam, RRR.  No hematoma.  CXR reveals stable leads, no ptx.  Device interrogation personally reviewed and normal  DC to home with routine wound care and follow-up.  Co Sign: Thompson Grayer, MD 11/06/2018 10:43 PM

## 2018-11-06 NOTE — Progress Notes (Signed)
Patient given discharge instructions and all questions answered.  

## 2018-11-11 ENCOUNTER — Encounter (HOSPITAL_COMMUNITY): Payer: Self-pay

## 2018-11-11 ENCOUNTER — Other Ambulatory Visit: Payer: Self-pay

## 2018-11-11 ENCOUNTER — Observation Stay (HOSPITAL_COMMUNITY)
Admission: EM | Admit: 2018-11-11 | Discharge: 2018-11-12 | Disposition: A | Discharge status: Home / Self Care | Payer: Medicaid Other | Attending: Cardiovascular Disease | Admitting: Cardiovascular Disease

## 2018-11-11 DIAGNOSIS — Z95810 Presence of automatic (implantable) cardiac defibrillator: Secondary | ICD-10-CM | POA: Diagnosis not present

## 2018-11-11 DIAGNOSIS — J45909 Unspecified asthma, uncomplicated: Secondary | ICD-10-CM | POA: Insufficient documentation

## 2018-11-11 DIAGNOSIS — Z1159 Encounter for screening for other viral diseases: Secondary | ICD-10-CM | POA: Diagnosis not present

## 2018-11-11 DIAGNOSIS — T82837A Hemorrhage of cardiac prosthetic devices, implants and grafts, initial encounter: Secondary | ICD-10-CM | POA: Diagnosis present

## 2018-11-11 DIAGNOSIS — I97638 Postprocedural hematoma of a circulatory system organ or structure following other circulatory system procedure: Principal | ICD-10-CM | POA: Insufficient documentation

## 2018-11-11 DIAGNOSIS — I4891 Unspecified atrial fibrillation: Secondary | ICD-10-CM | POA: Insufficient documentation

## 2018-11-11 DIAGNOSIS — R918 Other nonspecific abnormal finding of lung field: Secondary | ICD-10-CM | POA: Diagnosis not present

## 2018-11-11 DIAGNOSIS — F419 Anxiety disorder, unspecified: Secondary | ICD-10-CM | POA: Diagnosis not present

## 2018-11-11 DIAGNOSIS — Z8249 Family history of ischemic heart disease and other diseases of the circulatory system: Secondary | ICD-10-CM | POA: Diagnosis not present

## 2018-11-11 DIAGNOSIS — E039 Hypothyroidism, unspecified: Secondary | ICD-10-CM | POA: Diagnosis not present

## 2018-11-11 DIAGNOSIS — M4185 Other forms of scoliosis, thoracolumbar region: Secondary | ICD-10-CM | POA: Insufficient documentation

## 2018-11-11 DIAGNOSIS — I10 Essential (primary) hypertension: Secondary | ICD-10-CM | POA: Diagnosis not present

## 2018-11-11 DIAGNOSIS — Z87891 Personal history of nicotine dependence: Secondary | ICD-10-CM | POA: Diagnosis not present

## 2018-11-11 DIAGNOSIS — I252 Old myocardial infarction: Secondary | ICD-10-CM | POA: Insufficient documentation

## 2018-11-11 DIAGNOSIS — M8580 Other specified disorders of bone density and structure, unspecified site: Secondary | ICD-10-CM | POA: Insufficient documentation

## 2018-11-11 DIAGNOSIS — Y838 Other surgical procedures as the cause of abnormal reaction of the patient, or of later complication, without mention of misadventure at the time of the procedure: Secondary | ICD-10-CM | POA: Insufficient documentation

## 2018-11-11 LAB — CBC WITH DIFFERENTIAL/PLATELET
Abs Immature Granulocytes: 0.14 10*3/uL — ABNORMAL HIGH (ref 0.00–0.07)
Basophils Absolute: 0.1 10*3/uL (ref 0.0–0.1)
Basophils Relative: 1 %
Eosinophils Absolute: 0.1 10*3/uL (ref 0.0–0.5)
Eosinophils Relative: 1 %
HCT: 43 % (ref 36.0–46.0)
Hemoglobin: 13.8 g/dL (ref 12.0–15.0)
Immature Granulocytes: 1 %
Lymphocytes Relative: 29 %
Lymphs Abs: 2.9 10*3/uL (ref 0.7–4.0)
MCH: 29.7 pg (ref 26.0–34.0)
MCHC: 32.1 g/dL (ref 30.0–36.0)
MCV: 92.5 fL (ref 80.0–100.0)
Monocytes Absolute: 0.8 10*3/uL (ref 0.1–1.0)
Monocytes Relative: 7 %
Neutro Abs: 6.2 10*3/uL (ref 1.7–7.7)
Neutrophils Relative %: 61 %
Platelets: 237 10*3/uL (ref 150–400)
RBC: 4.65 MIL/uL (ref 3.87–5.11)
RDW: 13.9 % (ref 11.5–15.5)
WBC: 10.1 10*3/uL (ref 4.0–10.5)
nRBC: 0 % (ref 0.0–0.2)

## 2018-11-11 NOTE — ED Notes (Signed)
Bed: WA02 Expected date:  Expected time:  Means of arrival:  Comments: 

## 2018-11-11 NOTE — ED Provider Notes (Signed)
TIME SEEN: 11:22 PM  CHIEF COMPLAINT: Pain and swelling around AICD site  HPI: Patient is a 63 year old female with history of hypertension, atrial fibrillation on Eliquis, previous ventricular fibrillation and ventricular tachycardia who underwent AICD placement with Dr. Rayann Heman on November 05, 2018.  States of the past day she has noticed increasing pain and swelling to this area.  Steri-Strips in place.  No drainage.  No redness or warmth.  No fevers or chills.  She reports she always has chest pressure and this is unchanged for her.  She has not felt her AICD fire or alarm.  Called cardiologist on call who instructed that she come to the emergency department.  ROS: See HPI Constitutional: no fever  Eyes: no drainage  ENT: no runny nose   Cardiovascular: Chronic chest pain  Resp: no SOB  GI: no vomiting GU: no dysuria Integumentary: no rash  Allergy: no hives  Musculoskeletal: no leg swelling  Neurological: no slurred speech ROS otherwise negative  PAST MEDICAL HISTORY/PAST SURGICAL HISTORY:  Past Medical History:  Diagnosis Date  . Anxiety   . Arthritis   . Asthma   . Atrial fibrillation ([place])   . Cellulitis   . Dysrhythmia   . Hypertension   . Hypothyroidism   . MI (myocardial infarction) ([place])     MEDICATIONS:  Prior to Admission medications   Medication Sig Start Date End Date Taking? Authorizing Provider  albuterol (PROVENTIL HFA;VENTOLIN HFA) 108 (90 Base) MCG/ACT inhaler Inhale 2 puffs into the lungs every 6 (six) hours as needed for wheezing or shortness of breath.    [provider]  amiodarone (PACERONE) 200 MG tablet Take 1 tablet (200 mg total) by mouth at bedtime. 09/11/18   Clegg, Amy D, NP  apixaban (ELIQUIS) 5 MG TABS tablet Take 1 tablet (5 mg total) by mouth 2 (two) times daily. Restart 6/19 pm dose 11/06/18   Seiler, [employer] K, NP  bisoprolol (ZEBETA) 5 MG tablet Take 1 tablet (5 mg total) by mouth daily. 07/26/18   Clegg, Amy D, NP  fluticasone (FLONASE)  50 MCG/ACT nasal spray Place 1 spray into both nostrils daily as needed for allergies or rhinitis.    [provider]  furosemide (LASIX) 40 MG tablet Take 1 tablet (40 mg total) by mouth 2 (two) times daily. TAKE ADDITIONAL 1/2 TAB FOR EDEMA OR DYSPNEA Patient taking differently: Take 40 mg by mouth 2 (two) times daily.  10/07/18   Clegg, Amy D, NP  guaiFENesin (MUCINEX) 600 MG 12 hr tablet Take 1 tablet (600 mg total) by mouth 2 (two) times daily as needed. Patient not taking: Reported on 11/04/2018 10/25/18   Maudie Flakes, MD  KLOR-CON M20 20 MEQ tablet TAKE 1 TABLET BY MOUTH EVERY DAY Patient taking differently: Take 20 mEq by mouth daily.  10/17/18   Larey Dresser, MD  levothyroxine (SYNTHROID, LEVOTHROID) 50 MCG tablet Take 1 tablet (50 mcg total) by mouth daily before breakfast. 07/27/18   Clegg, Amy D, NP  LORazepam (ATIVAN) 1 MG tablet Take 1 tablet (1 mg total) by mouth every 8 (eight) hours as needed for anxiety. 10/16/18   Ripley Fraise, MD  montelukast (SINGULAIR) 10 MG tablet Take 10 mg by mouth at bedtime.    [provider]  pantoprazole (PROTONIX) 40 MG tablet Take 1 tablet (40 mg total) by mouth daily. Patient not taking: Reported on 10/21/2018 10/09/18   Sherwood Gambler, MD  spironolactone (ALDACTONE) 25 MG tablet Take 1 tablet (25 mg  total) by mouth daily. 09/24/18   Clegg, Amy D, NP  sucralfate (CARAFATE) 1 g tablet Take 1 tablet (1 g total) by mouth 4 (four) times daily as needed. Patient not taking: Reported on 11/04/2018 10/25/18   Sabas Sous, MD    ALLERGIES:  Allergies  Allergen Reactions  . Metoprolol Tartrate Shortness Of Breath  . Biaxin [Clarithromycin] Other (See Comments)    Sores in mouth  . Levaquin [Levofloxacin] Other (See Comments)    Sores in mouth  . Adhesive [Tape] Rash    Reaction to ekg pads  . Contrast Media [Iodinated Diagnostic Agents] Rash    SOCIAL HISTORY:  Social History   Tobacco Use  . Smoking status: Former Games developer   . Smokeless tobacco: Never Used  Substance Use Topics  . Alcohol use: Never    Frequency: Never    FAMILY HISTORY: Family History  Problem Relation Age of Onset  . Heart disease Father   . Atrial fibrillation Brother     EXAM: BP 106/82 (BP Location: Left Arm)   Pulse (!) 59   Temp 98.4 F (36.9 C) (Oral)   Resp 15   SpO2 97%  CONSTITUTIONAL: Alert and oriented and responds appropriately to questions. Well-appearing; well-nourished HEAD: Normocephalic EYES: Conjunctivae clear, pupils appear equal, EOMI ENT: normal nose; moist mucous membranes NECK: Supple, no meningismus, no nuchal rigidity, no LAD  CARD: RRR; S1 and S2 appreciated; no murmurs, no clicks, no rubs, no gallops CHEST: AICD in the left chest wall.  Incision appears intact.  There is a small amount of what is drainage from the upper portion of the wound.  There is no purulent drainage.  She does have small enough fluctuance around this area but no redness, warmth.  She is tender.  Please see picture below. RESP: Normal chest excursion without splinting or tachypnea; breath sounds clear and equal bilaterally; no wheezes, no rhonchi, no rales, no hypoxia or respiratory distress, speaking full sentences ABD/GI: Normal bowel sounds; non-distended; soft, non-tender, no rebound, no guarding, no peritoneal signs, no hepatosplenomegaly BACK:  The back appears normal and is non-tender to palpation, there is no CVA tenderness EXT: Normal ROM in all joints; non-tender to palpation; no edema; normal capillary refill; no cyanosis, no calf tenderness or swelling    SKIN: Normal color for age and race; warm; no rash NEURO: Moves all extremities equally PSYCH: The patient's mood and manner are appropriate. Grooming and personal hygiene are appropriate.  MEDICAL DECISION MAKING: Patient here with pain and swelling around her AICD.  It does not appear infected.  I suspect that she has an underlying seroma.  Will obtain labs and  interrogate her pacemaker.  Will discuss with cardiology on-call.  She declines pain medication.  States she took Tylenol at 4 PM.  ED PROGRESS: 2:10 AM  D/w cardiologist on-call Dr. Meredeth Ide.  She recommends obtaining a soft tissue ultrasound to evaluate for pocket of fluid.  She has reviewed patient's results and imaging.  If there is a pocket of fluid, this may need to be drained.  Will touch base with ultrasonography to ensure this can be done tonight.   Labs here are unremarkable.  Chest x-ray normal.  Awaiting results from pacemaker interrogation.  ICD interrogated.  No events noted.  No arrhythmia.  Everything functioning properly.   3:58 AM  Pt's soft tissue ultrasound shows a complex collection of fluid measuring up to 4.2 cm likely a hematoma.  Have discussed this finding with the cardiologist on-call  who [facility].  I will place admission orders to a telemetry, observation bed under Dr. Purvis SheffieldKoneswaran.  We will keep patient n.p.o.  She has been updated with this plan.  She is comfortable and resting without significant pain.  No drainage from her incision site.  I reviewed all nursing notes, vitals, pertinent previous records, EKGs, lab and urine results, imaging (as available).         EKG Interpretation  Date/Time:  Monday November 11 2018 23:46:00 EDT Ventricular Rate:  60 PR Interval:    QRS Duration: 105 QT Interval:  688 QTC Calculation: 688 R Axis:   -6 Text Interpretation:  Atrial-paced rhythm Borderline abnrm T, anterolateral leads Prolonged QT interval No significant change since last tracing Confirmed by Ward, Baxter HireKristen [phone] on 11/12/2018 12:44:48 AM         Ward, Layla MawKristen N, DO 11/12/18 [id number]

## 2018-11-11 NOTE — ED Triage Notes (Addendum)
Pt arrived via EMS from home. Pt had ICD placed last week at [facility], and today presents with surgical site swelling and pain that radiates to let shoulder.     EMS v/s 112/76, HR 64, RR 16, CBG 115 20 left ac

## 2018-11-12 ENCOUNTER — Emergency Department (HOSPITAL_COMMUNITY): Payer: Medicaid Other

## 2018-11-12 DIAGNOSIS — T82837A Hemorrhage of cardiac prosthetic devices, implants and grafts, initial encounter: Secondary | ICD-10-CM | POA: Diagnosis present

## 2018-11-12 LAB — BASIC METABOLIC PANEL
Anion gap: 12 (ref 5–15)
BUN: 13 mg/dL (ref 8–23)
CO2: 27 mmol/L (ref 22–32)
Calcium: 9.4 mg/dL (ref 8.9–10.3)
Chloride: 99 mmol/L (ref 98–111)
Creatinine, Ser: 1.24 mg/dL — ABNORMAL HIGH (ref 0.44–1.00)
GFR calc Af Amer: 54 mL/min — ABNORMAL LOW (ref >60)
GFR calc non Af Amer: 46 mL/min — ABNORMAL LOW (ref >60)
Glucose, Bld: 89 mg/dL (ref 70–99)
Potassium: 3.8 mmol/L (ref 3.5–5.1)
Sodium: 138 mmol/L (ref 135–145)

## 2018-11-12 LAB — TROPONIN I: Troponin I: <0.03 ng/mL (ref <0.03)

## 2018-11-12 LAB — SARS CORONAVIRUS 2 BY RT PCR (HOSPITAL ORDER, PERFORMED IN ~~LOC~~ HOSPITAL LAB): SARS Coronavirus 2: NEGATIVE

## 2018-11-12 MED ORDER — ALBUTEROL SULFATE HFA 108 (90 BASE) MCG/ACT IN AERS
1.0000 | INHALATION_SPRAY | RESPIRATORY_TRACT | Status: DC | PRN
  Administered 2018-11-12: 01:00:00 2 via RESPIRATORY_TRACT
  Filled 2018-11-12: qty 6.7

## 2018-11-12 NOTE — ED Notes (Signed)
US at bedside

## 2018-11-12 NOTE — ED Notes (Signed)
Spoke with [facility] Cath [facility], stated that pt will be seen at [facility] in a few hours rather than transferring her to [facility]

## 2018-11-12 NOTE — ED Notes (Signed)
[employer] called and reported everything is within expected range, ICD is functioning appropriately. No arrhthymias noted. Will fax over report.

## 2018-11-12 NOTE — ED Notes (Signed)
[facility] has been contacted regarding patient transport.  

## 2018-11-12 NOTE — ED Notes (Signed)
Bed: WA17 Expected date:  Expected time:  Means of arrival:  Comments: Room 2

## 2018-11-12 NOTE — Consult Note (Signed)
ELECTROPHYSIOLOGY CONSULT NOTE    Patient ID: Carrie Walters MRN: [id number], DOB/AGE: 11/29/1955 63 y.o.  Admit date: 11/11/2018 Date of Consult: 11/12/2018  Primary Physician: [place], Sarah T, MD Primary Cardiologist: Revenkar Heart Failure: Shirlee Latch Electrophysiologist: Elberta Fortis  Patient Profile: Carrie Walters is a 63 y.o. female with a history of VF arrest s/p ICD implant who is being seen today at the request of EDP for ICD pocket hematoma  HPI:  Ms.Oltzinitial VF arrest was in Oct 2019, atthat time cath showed normal coronaries and TTE with LVEF 20%, she had porrly healing LE wounds/ulcers and ICD was deferred utnil healed to reduce infection risk, she was placed with Life Vest.  March 2020she had a VF event, identified and treated appropriatelyby her Life vest,TTE at this time noted recovered LVEF,ICD again deferred 2/2 LE wounds as well as lice at that time. Subsequently the COVID19 pandemic broke and plans for implant were delayed. She presented to the hospital with her LifeVest alarming last week and underwent ICD implant that admission. She was discharged to home and did well initially. Last night, she noticed some swelling around her ICD site and presented to the ER for evaluation.    She denies chest pain, palpitations, dyspnea, PND, orthopnea, nausea, vomiting, dizziness, syncope, edema, weight gain, or early satiety.  Past Medical History:  Diagnosis Date  . Anxiety   . Arthritis   . Asthma   . Atrial fibrillation (HCC)   . Cellulitis   . Dysrhythmia   . Hypertension   . Hypothyroidism   . MI (myocardial infarction) [facility])      Surgical History:  Past Surgical History:  Procedure Laterality Date  . CARDIOVERSION N/A 03/18/2018   Procedure: CARDIOVERSION;  Surgeon: [name], MD;  Location: [facility] ENDOSCOPY;  Service: Cardiovascular;  Laterality: N/A;  . CHOLECYSTECTOMY    . ICD IMPLANT N/A 11/05/2018   Procedure: ICD IMPLANT;  Surgeon: Hillis Range, MD;   Location: [facility] INVASIVE CV LAB;  Service: Cardiovascular;  Laterality: N/A;  . LEFT HEART CATH AND CORONARY ANGIOGRAPHY N/A 02/25/2018   Procedure: LEFT HEART CATH AND CORONARY ANGIOGRAPHY;  Surgeon: [name], MD;  Location: [facility] INVASIVE CV LAB;  Service: Cardiovascular;  Laterality: N/A;  . TEE WITHOUT CARDIOVERSION N/A 03/18/2018   Procedure: TRANSESOPHAGEAL ECHOCARDIOGRAM (TEE);  Surgeon: [name], MD;  Location: [facility] ENDOSCOPY;  Service: Cardiovascular;  Laterality: N/A;  . UMBILICAL HERNIA REPAIR       Inpatient Medications:   Allergies:  Allergies  Allergen Reactions  . Metoprolol Tartrate Shortness Of Breath  . Biaxin [Clarithromycin] Other (See Comments)    Sores in mouth  . Levaquin [Levofloxacin] Other (See Comments)    Sores in mouth  . Adhesive [Tape] Rash    Reaction to ekg pads  . Contrast Media [Iodinated Diagnostic Agents] Rash    Social History   Socioeconomic History  . Marital status: Single    Spouse name: Not on file  . Number of children: 1  . Years of education: Not on file  . Highest education level: 12th grade  Occupational History  . Not on file  Social Needs  . Financial resource strain: Somewhat hard  . Food insecurity    Worry: Never true    Inability: Never true  . Transportation needs    Medical: Yes    Non-medical: No  Tobacco Use  . Smoking status: Former Games developer  . Smokeless tobacco: Never Used  Substance and Sexual Activity  . Alcohol use: Never  Frequency: Never  . Drug use: Never  . Sexual activity: Not on file  Lifestyle  . Physical activity    Days per week: Not on file    Minutes per session: Not on file  . Stress: Not on file  Relationships  . Social Musicianconnections    Talks on phone: Not on file    Gets together: Not on file    Attends religious service: Not on file    Active member of club or organization: Not on file    Attends meetings of clubs or organizations: Not on file    Relationship status: Not on  file  . Intimate partner violence    Fear of current or ex partner: Not on file    Emotionally abused: Not on file    Physically abused: Not on file    Forced sexual activity: Not on file  Other Topics Concern  . Not on file  Social History Narrative   [place]     Family History  Problem Relation Age of Onset  . Heart disease Father   . Atrial fibrillation Brother      Review of Systems: All other systems reviewed and are otherwise negative except as noted above.  Physical Exam: Vitals:   11/12/18 0230 11/12/18 0432 11/12/18 0600 11/12/18 0718  BP: 102/63 (!) 102/55 123/65 (!) 104/56  Pulse: (!) 58 (!) 59 60 60  Resp: 18 (!) 22 20 14   Temp:      TempSrc:      SpO2: 94% 100% 99% (!) 89%    GEN- The patient is morbidly obese appearing, alert and oriented x 3 today.   HEENT: normocephalic, atraumatic; sclera clear, conjunctiva pink; hearing intact; oropharynx clear; neck supple Lungs- Clear to ausculation bilaterally, normal work of breathing.  No wheezes, rales, rhonchi Heart- Regular rate and rhythm  GI- soft, non-tender, non-distended, bowel sounds present Extremities- no clubbing, cyanosis, +BLE edema MS- no significant deformity or atrophy Skin- warm and dry, no rash or lesion, ICD incision well healed with edges approximated. Small firm hematoma. Psych- euthymic mood, full affect Neuro- strength and sensation are intact  Labs:   Lab Results  Component Value Date   WBC 10.1 11/11/2018   HGB 13.8 11/11/2018   HCT 43.0 11/11/2018   MCV 92.5 11/11/2018   PLT 237 11/11/2018    Recent Labs  Lab 11/11/18 2347  NA 138  K 3.8  CL 99  CO2 27  BUN 13  CREATININE 1.24*  CALCIUM 9.4  GLUCOSE 89      Radiology/Studies: Dg Chest 2 View  Result Date: 11/12/2018 CLINICAL DATA:  Chest pain. Pain around ICD. EXAM: CHEST - 2 VIEW COMPARISON:  Radiograph 11/06/2018 FINDINGS: Dual lead left-sided pacemaker in place with intact leads. Pacemaker is  unchanged in position. Unchanged heart size and mediastinal contours. No pneumothorax. Increasing streaky bibasilar atelectasis. No pulmonary edema or pleural fluid. No acute osseous abnormalities. IMPRESSION: 1. Dual lead left-sided pacemaker unchanged in position with intact leads. 2. Increased bibasilar atelectasis. Electronically Signed   By: Narda RutherfordMelanie  Sanford M.D.   On: 11/12/2018 01:36   Dg Chest 2 View  Result Date: 11/06/2018 CLINICAL DATA:  Cardiac device in situ. EXAM: CHEST - 2 VIEW COMPARISON:  07/05/2018 FINDINGS: A 2 lead AICD device is been placed in the interval. Leads terminate over the right atrial appendage and right ventricle, in good position. No pneumothorax. Bibasilar opacities are platelike and stable. No other interval changes. IMPRESSION: 1. An  AICD device is been placed as above. Leads appear to be in good position. No pneumothorax. 2. Bibasilar opacities are platelike and stable. Atelectasis is favored but infection is not completely excluded. Electronically Signed   By: Dorise Bullion III M.D   On: 11/06/2018 08:42   Dg Chest 2 View  Result Date: 10/25/2018 CLINICAL DATA:  Chest pain for the past 16 days. The pain is beneath the left breast. EXAM: CHEST - 2 VIEW COMPARISON:  10/20/2018. FINDINGS: Normal sized heart. Stable linear scarring at both lung bases. Otherwise, clear lungs with normal vascularity. Stable mild dextroconvex thoracolumbar scoliosis and diffuse osteopenia. Thoracic spine degenerative changes. IMPRESSION: No acute abnormality. Electronically Signed   By: Claudie Revering M.D.   On: 10/25/2018 11:34   Dg Chest 2 View  Result Date: 10/20/2018 CLINICAL DATA:  Chest pain short of breath EXAM: CHEST - 2 VIEW COMPARISON:  10/16/2018, 10/09/2018, 07/23/2018 FINDINGS: Stable atelectasis or scarring at the bases. No acute consolidation or effusion. Stable cardiomediastinal silhouette. No pneumothorax. IMPRESSION: Stable scarring or atelectasis at the bases.  Electronically Signed   By: Donavan Foil M.D.   On: 10/20/2018 00:38   Dg Chest Port 1 View  Result Date: 11/03/2018 CLINICAL DATA:  Intermittent chest pressure for several weeks. Pain is worse today. EXAM: PORTABLE CHEST 1 VIEW COMPARISON:  Two-view chest x-ray 10/25/2018 FINDINGS: The heart size is normal. Linear airspace opacities are present both bases. There is mild pulmonary vascular congestion without frank edema. Small effusions are likely present. The visualized soft tissues and bony thorax are unremarkable. IMPRESSION: 1. Cardiomegaly and mild pulmonary vascular congestion. 2. Bibasilar airspace disease has increased. While this may represent atelectasis, infection is not excluded. Electronically Signed   By: San Morelle M.D.   On: 11/03/2018 21:35   Dg Chest Port 1 View  Result Date: 10/16/2018 CLINICAL DATA:  Chest pain and shortness of breath for 2 hours EXAM: PORTABLE CHEST 1 VIEW COMPARISON:  10/09/2018 FINDINGS: Cardiac shadow is stable. Aortic calcifications are seen. The lungs are well aerated bilaterally. Minimal right basilar atelectasis is again seen and stable. No new focal abnormality is noted. IMPRESSION: Stable right basilar atelectasis. Electronically Signed   By: Inez Catalina M.D.   On: 10/16/2018 02:41   [place] Chest Soft Tissue  Result Date: 11/12/2018 CLINICAL DATA:  Fluid pocket around AICD generator, which was placed 11/07/2018 EXAM: ULTRASOUND OF HEAD/NECK SOFT TISSUES TECHNIQUE: Ultrasound examination of the head and neck soft tissues was performed in the area of clinical concern. COMPARISON:  None. FINDINGS: There is a complex fluid collection measuring 3.5 x 0.8 x 4.2 cm at the AICD generator pocket. No hypervascularity demonstrated with color Doppler. No fluid extending into the deep chest. IMPRESSION: Complex collection measuring up to 4.2 cm at the site of AICD generator, probably a hematoma. No hypervascularity. Electronically Signed   By: Ulyses Jarred M.D.    On: 11/12/2018 03:13     DEVICE HISTORY: MDT dual chamber ICD implanted 11/05/2018  Assessment/Plan: 1.  ICD pocket hematoma Patient with small firm hematoma Wound edges well approximated Advised patient no bathing until wound recheck on Thursday in office Reviewed with patient instructions on when to call the office (increased swelling, bleeding) Advised tylenol for pain control   For questions or updates, please contact [facility] Please consult [email or web address] for contact info under Cardiology/STEMI.  Signed, Chanetta Marshall 11/12/2018 8:57 AM

## 2018-11-12 NOTE — ED Notes (Signed)
Interrogated pacemaker, will wait for report

## 2018-11-12 NOTE — Discharge Instructions (Addendum)
You were evaluated in the Emergency Department and after careful evaluation, we did not find any emergent condition requiring admission or further testing in the hospital.  Your symptoms today seem to be due to a hematoma near the new ICD.  Please follow-up with cardiology as discussed.  Please return to the Emergency Department if you experience any worsening of your condition.  We encourage you to follow up with a primary care provider.  Thank you for allowing [place] to be a part of your care.

## 2018-11-13 ENCOUNTER — Telehealth: Payer: Self-pay

## 2018-11-13 NOTE — Telephone Encounter (Signed)
Appointment 11-14-2018 at 9:30 am      COVID-19 Pre-Screening Questions:  . In the past 7 to 10 days have you had a cough,  shortness of breath, headache, congestion, fever (100 or greater) body aches, chills, sore throat, or sudden loss of taste or sense of smell? Some shortness of breathe but it is preexisting before her surgery.  . Have you been around anyone with known Covid 19. No . Have you been around anyone who is awaiting Covid 19 test results in the past 7 to 10 days? No . Have you been around anyone who has been exposed to Covid 19, or has mentioned symptoms of Covid 19 within the past 7 to 10 days? No  If you have any concerns/questions about symptoms patients report during screening (either on the phone or at threshold). Contact the provider seeing the patient or DOD for further guidance.  If neither are available contact a member of the leadership team.   Pt answered No to all covid-19 questions. The shortness of breath she has had for a long time before she had her surgery. I told the pt when she come to the appointment to please wear a mask. If she can come alone please do so because we are reducing the number of people that is coming into the office. The pt verbalized understanding.

## 2018-11-14 ENCOUNTER — Telehealth (INDEPENDENT_AMBULATORY_CARE_PROVIDER_SITE_OTHER): Payer: Medicaid Other | Admitting: *Deleted

## 2018-11-14 ENCOUNTER — Other Ambulatory Visit: Payer: Self-pay

## 2018-11-14 ENCOUNTER — Telehealth: Payer: Self-pay | Admitting: Cardiology

## 2018-11-14 DIAGNOSIS — I5023 Acute on chronic systolic (congestive) heart failure: Secondary | ICD-10-CM | POA: Diagnosis not present

## 2018-11-14 LAB — CUP PACEART REMOTE DEVICE CHECK
Battery Remaining Longevity: 129 mo
Battery Voltage: 3.09 V
Brady Statistic AP VP Percent: 0.01 %
Brady Statistic AP VS Percent: 17.56 %
Brady Statistic AS VP Percent: 0.03 %
Brady Statistic AS VS Percent: 82.41 %
Brady Statistic RA Percent Paced: 17.56 %
Brady Statistic RV Percent Paced: 0.03 %
Date Time Interrogation Session: 20200625124647
HighPow Impedance: 63 Ohm
Implantable Lead Implant Date: 20200616
Implantable Lead Implant Date: 20200616
Implantable Lead Location: 753859
Implantable Lead Location: 753860
Implantable Lead Model: 5076
Implantable Pulse Generator Implant Date: 20200616
Lead Channel Impedance Value: 323 Ohm
Lead Channel Impedance Value: 456 Ohm
Lead Channel Impedance Value: 494 Ohm
Lead Channel Pacing Threshold Amplitude: 0.75 V
Lead Channel Pacing Threshold Amplitude: 0.875 V
Lead Channel Pacing Threshold Pulse Width: 0.4 ms
Lead Channel Pacing Threshold Pulse Width: 0.4 ms
Lead Channel Sensing Intrinsic Amplitude: 4 mV
Lead Channel Sensing Intrinsic Amplitude: 4 mV
Lead Channel Sensing Intrinsic Amplitude: 8 mV
Lead Channel Sensing Intrinsic Amplitude: 8 mV
Lead Channel Setting Pacing Amplitude: 3.5 V
Lead Channel Setting Pacing Amplitude: 3.5 V
Lead Channel Setting Pacing Pulse Width: 0.4 ms
Lead Channel Setting Sensing Sensitivity: 0.3 mV

## 2018-11-14 NOTE — Telephone Encounter (Signed)
Picture received, see virtual OV note 11/14/18.

## 2018-11-14 NOTE — Progress Notes (Signed)
Virtual wound check appointment. Steri-strips removed prior to visit. Wound without redness or edema. Incision edges appear approximated, wound healing well. Transmission received. Automatic threshold, sensing, and impedance testing stable. No mode switches and no ventricular high rate episodes. Patient educated about wound care, arm mobility, lifting restrictions, shock plan. ROV in 3 months with implanting physician.

## 2018-11-14 NOTE — Telephone Encounter (Signed)
Manual transmission requested. Daughter will not be able to send until around 4pm. Will call daughter then.

## 2018-11-14 NOTE — Telephone Encounter (Signed)
New Message:   Pt says she will not be able to come for her appointment. She does not have a ride here from [place]. She wants to know if she should get an ambulance and go to the hospital to haveher wound site checked. She said it was turning red around the area where her incision.

## 2018-11-14 NOTE — Telephone Encounter (Signed)
Pt states her daughter took a picture of her wound. I gave the pt [place] work e-mail address to send the picture.

## 2018-11-14 NOTE — Telephone Encounter (Signed)
Pt states she cannot make it to her appt today and the wound is red and swollen; she is unsure how to send picture. Pt states she is going to call a family member to see if they can assist her in sending a picture of the wound. Pt verbalized she will call DC back when she finds someone to help. Given direct DC number. Pt has no further questions at this time.

## 2018-11-18 LAB — CUP PACEART INCLINIC DEVICE CHECK
Date Time Interrogation Session: 20200629072359
Implantable Lead Implant Date: 20200616
Implantable Lead Implant Date: 20200616
Implantable Lead Location: 753859
Implantable Lead Location: 753860
Implantable Lead Model: 5076
Implantable Pulse Generator Implant Date: 20200616

## 2018-11-19 ENCOUNTER — Other Ambulatory Visit: Payer: Self-pay

## 2018-11-19 ENCOUNTER — Telehealth: Payer: Self-pay | Admitting: Licensed Clinical Social Worker

## 2018-11-19 ENCOUNTER — Ambulatory Visit: Payer: Medicaid Other

## 2018-11-19 ENCOUNTER — Ambulatory Visit (HOSPITAL_COMMUNITY)
Admission: RE | Admit: 2018-11-19 | Discharge: 2018-11-19 | Disposition: A | Discharge status: Home / Self Care | Payer: Medicaid Other | Source: Ambulatory Visit | Attending: Cardiology | Admitting: Cardiology

## 2018-11-19 NOTE — Telephone Encounter (Signed)
CSW contacted patient to discuss and enroll in [employer]. Patient agreeable to program and verbalizes understanding of application. CSW informed patient that application will be submitted and will receive delivery date from [employer].  Patient grateful for the opportunity with the [employer] program for heart healthy meals. Patient informed this program is temporary although hopeful to support patient through the summer. CSW will continue to monitor and be available as needed.  Jackie Yeila Morro, LCSW, CCSW-MCS [phone] 

## 2018-11-21 ENCOUNTER — Encounter (HOSPITAL_COMMUNITY): Payer: Medicaid Other | Admitting: Cardiology

## 2018-11-21 ENCOUNTER — Ambulatory Visit: Payer: Medicaid Other

## 2018-11-26 ENCOUNTER — Encounter (HOSPITAL_COMMUNITY): Payer: Medicaid Other

## 2018-11-27 ENCOUNTER — Encounter (HOSPITAL_COMMUNITY): Payer: Self-pay

## 2018-11-27 ENCOUNTER — Other Ambulatory Visit: Payer: Self-pay

## 2018-11-27 ENCOUNTER — Emergency Department (HOSPITAL_COMMUNITY)
Admission: EM | Admit: 2018-11-27 | Discharge: 2018-11-27 | Disposition: A | Discharge status: Home / Self Care | Payer: Medicaid Other | Attending: Emergency Medicine | Admitting: Emergency Medicine

## 2018-11-27 ENCOUNTER — Emergency Department (HOSPITAL_COMMUNITY): Payer: Medicaid Other

## 2018-11-27 DIAGNOSIS — I509 Heart failure, unspecified: Secondary | ICD-10-CM | POA: Insufficient documentation

## 2018-11-27 DIAGNOSIS — Z79899 Other long term (current) drug therapy: Secondary | ICD-10-CM | POA: Insufficient documentation

## 2018-11-27 DIAGNOSIS — I110 Hypertensive heart disease with heart failure: Secondary | ICD-10-CM | POA: Insufficient documentation

## 2018-11-27 DIAGNOSIS — R0789 Other chest pain: Secondary | ICD-10-CM | POA: Diagnosis not present

## 2018-11-27 DIAGNOSIS — I252 Old myocardial infarction: Secondary | ICD-10-CM | POA: Insufficient documentation

## 2018-11-27 DIAGNOSIS — Z95810 Presence of automatic (implantable) cardiac defibrillator: Secondary | ICD-10-CM | POA: Diagnosis not present

## 2018-11-27 DIAGNOSIS — R0602 Shortness of breath: Secondary | ICD-10-CM | POA: Diagnosis not present

## 2018-11-27 DIAGNOSIS — J181 Lobar pneumonia, unspecified organism: Secondary | ICD-10-CM | POA: Insufficient documentation

## 2018-11-27 DIAGNOSIS — Z87891 Personal history of nicotine dependence: Secondary | ICD-10-CM | POA: Insufficient documentation

## 2018-11-27 DIAGNOSIS — Z20828 Contact with and (suspected) exposure to other viral communicable diseases: Secondary | ICD-10-CM | POA: Diagnosis not present

## 2018-11-27 DIAGNOSIS — E039 Hypothyroidism, unspecified: Secondary | ICD-10-CM | POA: Insufficient documentation

## 2018-11-27 DIAGNOSIS — J189 Pneumonia, unspecified organism: Secondary | ICD-10-CM

## 2018-11-27 DIAGNOSIS — Z7901 Long term (current) use of anticoagulants: Secondary | ICD-10-CM | POA: Insufficient documentation

## 2018-11-27 DIAGNOSIS — R079 Chest pain, unspecified: Secondary | ICD-10-CM | POA: Diagnosis present

## 2018-11-27 LAB — CBC WITH DIFFERENTIAL/PLATELET
Abs Immature Granulocytes: 0.06 10*3/uL (ref 0.00–0.07)
Basophils Absolute: 0.1 10*3/uL (ref 0.0–0.1)
Basophils Relative: 1 %
Eosinophils Absolute: 0 10*3/uL (ref 0.0–0.5)
Eosinophils Relative: 0 %
HCT: 42.5 % (ref 36.0–46.0)
Hemoglobin: 13.6 g/dL (ref 12.0–15.0)
Immature Granulocytes: 1 %
Lymphocytes Relative: 26 %
Lymphs Abs: 2.3 10*3/uL (ref 0.7–4.0)
MCH: 29.6 pg (ref 26.0–34.0)
MCHC: 32 g/dL (ref 30.0–36.0)
MCV: 92.4 fL (ref 80.0–100.0)
Monocytes Absolute: 0.6 10*3/uL (ref 0.1–1.0)
Monocytes Relative: 6 %
Neutro Abs: 5.9 10*3/uL (ref 1.7–7.7)
Neutrophils Relative %: 66 %
Platelets: 254 10*3/uL (ref 150–400)
RBC: 4.6 MIL/uL (ref 3.87–5.11)
RDW: 14.5 % (ref 11.5–15.5)
WBC: 8.9 10*3/uL (ref 4.0–10.5)
nRBC: 0 % (ref 0.0–0.2)

## 2018-11-27 LAB — BASIC METABOLIC PANEL
Anion gap: 11 (ref 5–15)
BUN: 11 mg/dL (ref 8–23)
CO2: 29 mmol/L (ref 22–32)
Calcium: 9.9 mg/dL (ref 8.9–10.3)
Chloride: 99 mmol/L (ref 98–111)
Creatinine, Ser: 1.04 mg/dL — ABNORMAL HIGH (ref 0.44–1.00)
GFR calc Af Amer: >60 mL/min (ref >60)
GFR calc non Af Amer: 57 mL/min — ABNORMAL LOW (ref >60)
Glucose, Bld: 103 mg/dL — ABNORMAL HIGH (ref 70–99)
Potassium: 4.6 mmol/L (ref 3.5–5.1)
Sodium: 139 mmol/L (ref 135–145)

## 2018-11-27 LAB — BRAIN NATRIURETIC PEPTIDE: B Natriuretic Peptide: 53 pg/mL (ref 0.0–100.0)

## 2018-11-27 LAB — SARS CORONAVIRUS 2 BY RT PCR (HOSPITAL ORDER, PERFORMED IN ~~LOC~~ HOSPITAL LAB): SARS Coronavirus 2: NEGATIVE

## 2018-11-27 LAB — TROPONIN I (HIGH SENSITIVITY): Troponin I (High Sensitivity): 4 ng/L

## 2018-11-27 MED ORDER — CEPHALEXIN 500 MG PO CAPS: 500.0000 mg | ORAL_CAPSULE | Freq: Two times a day (BID) | ORAL | 0 refills | Status: DC

## 2018-11-27 MED ORDER — DOXYCYCLINE HYCLATE 100 MG PO CAPS: 100.0000 mg | ORAL_CAPSULE | Freq: Two times a day (BID) | ORAL | 0 refills | Status: AC

## 2018-11-27 NOTE — ED Notes (Signed)
Pt reports tightenng in her belly and a swooshing sound in her head for 3 mths.

## 2018-11-27 NOTE — ED Notes (Signed)
Called lab to add on BNP. ?

## 2018-11-27 NOTE — ED Notes (Signed)
Called lab again to ask to add on BNP

## 2018-11-27 NOTE — ED Provider Notes (Signed)
[facility] EMERGENCY DEPARTMENT Provider Note   CSN: [id number] Arrival date & time: 11/27/18  1546    History   Chief Complaint Chief Complaint  Patient presents with  . Chest Pain    HPI Carrie Walters is a 63 y.o. female.     63 y.o female with a PMH of Afib, Asthma, Anxiety presents to the ED with a chief complaint of pain around her defibrillator which was place about 3 weeks ago. Patient reports she went into cardiac arrest on 06/01 had an ICD placed on 06/16 by Dr. Johney Frame, she reports having an appointment with him yesterday but missed it due to transportation.  Patient reports for the past 11 weeks she is began to feel a buzzing along her chest that she reports thinks it may be coming from her ICD, this pain radiates around to her back.  She also reports pain with swallowing states that this might be due to her ICD as well.  She also endorses some shortness of breath, patient has been sleeping on a recliner these past few days.  Patient is currently on 2 L of oxygen at home.  States her chest pain radiates onto her back.  Also reports there is noted to be fluid around her ICD.  Denies any fevers, weakness, aches.  The history is provided by the patient and medical records.    Past Medical History:  Diagnosis Date  . Anxiety   . Arthritis   . Asthma   . Atrial fibrillation (HCC)   . Cellulitis   . Dysrhythmia   . Hypertension   . Hypothyroidism   . MI (myocardial infarction) [facility])     Patient Active Problem List   Diagnosis Date Noted  . ICD (implantable cardioverter-defibrillator) pocket hematoma 11/12/2018  . Hx of ventricular fibrillation 11/04/2018  . Acute on chronic systolic (congestive) heart failure (HCC) 07/23/2018  . CHF (congestive heart failure) (HCC) 04/02/2018  . Acute on chronic combined systolic and diastolic CHF (congestive heart failure) (HCC) 03/14/2018  . Diarrhea 03/14/2018  . Left upper quadrant pain 03/14/2018  . Cardiac  arrest (HCC)   . Acute systolic CHF (congestive heart failure) (HCC)   . Pressure injury of skin 02/22/2018  . A-fib (HCC) 02/21/2018  . Atrial flutter (HCC) 12/27/2017  . Morbid obesity (HCC) 12/27/2017  . Hypothyroidism 12/21/2017    Past Surgical History:  Procedure Laterality Date  . CARDIOVERSION N/A 03/18/2018   Procedure: CARDIOVERSION;  Surgeon: [name], MD;  Location: [facility] ENDOSCOPY;  Service: Cardiovascular;  Laterality: N/A;  . CHOLECYSTECTOMY    . ICD IMPLANT N/A 11/05/2018   Procedure: ICD IMPLANT;  Surgeon: Hillis Range, MD;  Location: [facility] INVASIVE CV LAB;  Service: Cardiovascular;  Laterality: N/A;  . LEFT HEART CATH AND CORONARY ANGIOGRAPHY N/A 02/25/2018   Procedure: LEFT HEART CATH AND CORONARY ANGIOGRAPHY;  Surgeon: [name], MD;  Location: [facility] INVASIVE CV LAB;  Service: Cardiovascular;  Laterality: N/A;  . TEE WITHOUT CARDIOVERSION N/A 03/18/2018   Procedure: TRANSESOPHAGEAL ECHOCARDIOGRAM (TEE);  Surgeon: [name], MD;  Location: [facility] ENDOSCOPY;  Service: Cardiovascular;  Laterality: N/A;  . UMBILICAL HERNIA REPAIR       OB History   No obstetric history on file.      Home Medications    Prior to Admission medications   Medication Sig Start Date End Date Taking? Authorizing Provider  acetaminophen (TYLENOL) 500 MG tablet Take 500 mg by mouth every 6 (six) hours as needed for  moderate pain.    [provider]  albuterol (PROVENTIL HFA;VENTOLIN HFA) 108 (90 Base) MCG/ACT inhaler Inhale 2 puffs into the lungs every 6 (six) hours as needed for wheezing or shortness of breath.    [provider]  amiodarone (PACERONE) 200 MG tablet Take 1 tablet (200 mg total) by mouth at bedtime. 09/11/18   Clegg, Amy D, NP  apixaban (ELIQUIS) 5 MG TABS tablet Take 1 tablet (5 mg total) by mouth 2 (two) times daily. Restart 6/19 pm dose 11/06/18   Seiler, [facility] Hospit[facility] K, NP  bisoprolol (ZEBETA) 5 MG tablet Take 1 tablet (5 mg total) by mouth daily.  07/26/18   Clegg, Amy D, NP  doxycycline (VIBRAMYCIN) 100 MG capsule Take 1 capsule (100 mg total) by mouth 2 (two) times daily for 7 days. 11/27/18 12/04/18  Claude MangesSoto, Karsten Vaughn, PA-C  fluticasone (FLONASE) 50 MCG/ACT nasal spray Place 1 spray into both nostrils daily as needed for allergies or rhinitis.    [provider]  furosemide (LASIX) 40 MG tablet Take 1 tablet (40 mg total) by mouth 2 (two) times daily. TAKE ADDITIONAL 1/2 TAB FOR EDEMA OR DYSPNEA Patient taking differently: Take 40 mg by mouth 2 (two) times daily.  10/07/18   Clegg, Amy D, NP  KLOR-CON M20 20 MEQ tablet TAKE 1 TABLET BY MOUTH EVERY DAY Patient taking differently: Take 20 mEq by mouth daily.  10/17/18   Laurey MoraleMcLean, Dalton S, MD  levothyroxine (SYNTHROID, LEVOTHROID) 50 MCG tablet Take 1 tablet (50 mcg total) by mouth daily before breakfast. 07/27/18   Clegg, Amy D, NP  pantoprazole (PROTONIX) 40 MG tablet Take 1 tablet (40 mg total) by mouth daily. 10/09/18   Pricilla LovelessGoldston, Scott, MD  spironolactone (ALDACTONE) 25 MG tablet Take 1 tablet (25 mg total) by mouth daily. 09/24/18   Tonye Becketlegg, Amy D, NP    Family History Family History  Problem Relation Age of Onset  . Heart disease Father   . Atrial fibrillation Brother     Social History Social History   Tobacco Use  . Smoking status: Former Games developermoker  . Smokeless tobacco: Never Used  Substance Use Topics  . Alcohol use: Never    Frequency: Never  . Drug use: Never     Allergies   Metoprolol tartrate, Biaxin [clarithromycin], Levaquin [levofloxacin], Adhesive [tape], and Contrast media [iodinated diagnostic agents]   Review of Systems Review of Systems  Constitutional: Negative for chills and fever.  HENT: Negative for ear pain and sore throat.   Eyes: Negative for pain and visual disturbance.  Respiratory: Positive for shortness of breath. Negative for cough.   Cardiovascular: Positive for chest pain. Negative for palpitations.  Gastrointestinal: Negative for abdominal pain  and vomiting.  Genitourinary: Negative for dysuria and hematuria.  Musculoskeletal: Negative for arthralgias and back pain.  Skin: Positive for color change. Negative for rash.  Neurological: Negative for seizures and syncope.  All other systems reviewed and are negative.    Physical Exam Updated Vital Signs BP 114/65   Pulse (!) 59   Temp 98.3 F (36.8 C) (Oral)   Resp 19   SpO2 100%   Physical Exam Vitals signs and nursing note reviewed.  Constitutional:      General: She is not in acute distress.    Appearance: She is well-developed. She is obese. She is not ill-appearing.  HENT:     Head: Normocephalic and atraumatic.     Mouth/Throat:     Pharynx: No oropharyngeal exudate.  Eyes:  Pupils: Pupils are equal, round, and reactive to light.  Neck:     Musculoskeletal: Normal range of motion.  Cardiovascular:     Rate and Rhythm: Regular rhythm.     Heart sounds: Normal heart sounds.  Pulmonary:     Effort: Pulmonary effort is normal. No respiratory distress.     Breath sounds: Normal breath sounds.  Chest:     Chest wall: Tenderness present.    Abdominal:     General: Bowel sounds are normal. There is no distension.     Palpations: Abdomen is soft.     Tenderness: There is no abdominal tenderness. There is no right CVA tenderness or left CVA tenderness.     Comments: Abdomen appears distended, bowel sounds present.  Musculoskeletal:        General: No tenderness or deformity.     Right lower leg: Edema present.     Left lower leg: Edema present.     Comments: Bilateral leg edema, appears erythematous, patient reports sitting on her recliner the past couple days.  Skin:    General: Skin is warm and dry.  Neurological:     Mental Status: She is alert and oriented to person, place, and time.      ED Treatments / Results  Labs (all labs ordered are listed, but only abnormal results are displayed) Labs Reviewed  BASIC METABOLIC PANEL - Abnormal; Notable  for the following components:      Result Value   Glucose, Bld 103 (*)    Creatinine, Ser 1.04 (*)    GFR calc non Af Amer 57 (*)    All other components within normal limits  SARS CORONAVIRUS 2 (HOSPITAL ORDER, [place] [facility])  TROPONIN I (HIGH SENSITIVITY)  CBC WITH DIFFERENTIAL/PLATELET  BRAIN NATRIURETIC PEPTIDE  TROPONIN I (HIGH SENSITIVITY)    EKG EKG Interpretation  Date/Time:  Wednesday November 27 2018 15:47:23 EDT Ventricular Rate:  60 PR Interval:    QRS Duration: 112 QT Interval:  464 QTC Calculation: 464 R Axis:     Text Interpretation:  Atrial-paced complexes Borderline intraventricular conduction delay Low voltage, precordial leads Borderline T abnormalities, anterior leads No significant change since last tracing Confirmed by Deno Etienne [phone]) on 11/27/2018 6:37:57 PM   Radiology Dg Chest Portable 1 View  Result Date: 11/27/2018 CLINICAL DATA:  Chest pain EXAM: PORTABLE CHEST 1 VIEW COMPARISON:  November 23, 2018 FINDINGS: There is atelectasis with patchy consolidation in the right base. There is also atelectasis in the left base. Heart is upper normal in size with pulmonary vascularity normal. Pacemaker leads are attached to the right atrium and right ventricle. No pneumothorax. No adenopathy. No bone lesions. No pneumothorax. IMPRESSION: Bibasilar atelectasis with apparent patchy infiltrate in the right base, presumably due to pneumonia. Lungs elsewhere clear. Stable cardiac silhouette. Followup PA and lateral chest radiographs recommended in 3-4 weeks following trial of antibiotic therapy to ensure resolution and exclude underlying malignancy. Electronically Signed   By: Lowella Grip III M.D.   On: 11/27/2018 16:40    Procedures Procedures (including critical care time)  Medications Ordered in ED Medications - No data to display   Initial Impression / Assessment and Plan / ED Course  I have reviewed the triage vital signs and the nursing  notes.  Pertinent labs & imaging results that were available during my care of the patient were reviewed by me and considered in my medical decision making (see chart for details).  Patient with extensive medical history presents to the ED with complaints of chest pain, reports is mostly around where her ICD implant was placed on 11/05/2018.  Had an appointment to follow-up with Dr. Johney FrameAllred in office yesterday, missed this appointment due to transportation issues.  Also endorses some shortness of breath, currently on no home oxygen, not requiring oxygen while in the ED.  CBC showed no leukocytosis, hemoglobin is within normal limits.  BMP showed no electrolyte derangement, glucose is slightly elevated, creatinine function is also slightly elevated but consistent with her previous visits.  BNP was normal.  Did voice some shortness of breath, has been sitting on her recliner these past couple nights along with some swelling to her legs.  First troponin was 4. Will obtain delta trop. Will also interrogate patient's pacemaker.  No hypoxia, no tachycardia low suspicion for any pulmonary embolism, patient is currently anticoagulated for A. Fib. On eliquis.   DG Xray showed: Bibasilar atelectasis with apparent patchy infiltrate in the right  base, presumably due to pneumonia. Lungs elsewhere clear. Stable  cardiac silhouette.    Followup PA and lateral chest radiographs recommended in 3-4 weeks  following trial of antibiotic therapy to ensure resolution and  exclude underlying malignancy.      10:18 PM Charge RN was asked to interrogate pacemaker, summary pending.   10:15 PM Spoke to Hospitalist Dr. Julian ReilGardner, who recommends cardiology consult. EF of 20%  10:42 PM   to Dr. Daphine DeutscherMartin of cardiology who advised patient would be appropriate for outpatient follow-up.  We will treat patient's pneumonia, no increase in oxygen requirement, otherwise stable vital signs.Patient will be treat for pneumonia  with doxy, she is allergic to levo along with azithromycin. Return precautions discussed at length.    Portions of this note were generated with Scientist, clinical (histocompatibility and immunogenetics)Dragon dictation software. Dictation errors may occur despite best attempts at proofreading.    Final Clinical Impressions(s) / ED Diagnoses   Final diagnoses:  Atypical chest pain  Community acquired pneumonia of right lower lobe of lung [facility](HCC)    ED Discharge Orders         Ordered    cephALEXin (KEFLEX) 500 MG capsule  2 times daily,   Status:  Discontinued     11/27/18 2257    doxycycline (VIBRAMYCIN) 100 MG capsule  2 times daily     11/27/18 2301           Claude MangesSoto, Walt Geathers, PA-C 11/27/18 2302    Melene PlanFloyd, Dan, DO 11/27/18 2308

## 2018-11-27 NOTE — Discharge Instructions (Addendum)
Please follow up with cardiology as needed. I have prescribed medication to help treat your pneumonia, please take this medication as prescribed. If you experience any shortness of breath, chest pain or worsening please return to the ED.

## 2018-11-27 NOTE — ED Triage Notes (Signed)
[facility] reports pt with CP x 2 days, defib placed 2 weeks ago. Pain with inspiration 5/10. Hx MI, chf, copd, afib. Uses prn O2, at 91% RA, 100% 2 lpm. Given 324mg  HSA. Adhesive allergy.

## 2018-11-27 NOTE — ED Notes (Signed)
medtronic interrogation completed  

## 2018-11-27 NOTE — ED Notes (Signed)
Pt requested PTAR.

## 2018-11-27 NOTE — ED Notes (Signed)
Called ptar for transport home 

## 2018-12-06 ENCOUNTER — Telehealth (HOSPITAL_COMMUNITY): Payer: Self-pay

## 2018-12-06 NOTE — Telephone Encounter (Signed)
Cardiac clearance faxed to [facility]. Confirmation received

## 2018-12-17 ENCOUNTER — Ambulatory Visit (HOSPITAL_COMMUNITY)
Admission: RE | Admit: 2018-12-17 | Discharge: 2018-12-17 | Disposition: A | Discharge status: Home / Self Care | Payer: Medicaid Other | Source: Ambulatory Visit | Attending: Cardiology | Admitting: Cardiology

## 2018-12-17 ENCOUNTER — Telehealth (HOSPITAL_COMMUNITY): Payer: Self-pay | Admitting: Licensed Clinical Social Worker

## 2018-12-17 ENCOUNTER — Other Ambulatory Visit: Payer: Self-pay

## 2018-12-17 ENCOUNTER — Encounter (HOSPITAL_COMMUNITY): Payer: Self-pay

## 2018-12-17 VITALS — BP 124/70 | HR 65 | Wt 252.6 lb

## 2018-12-17 DIAGNOSIS — Z87891 Personal history of nicotine dependence: Secondary | ICD-10-CM | POA: Insufficient documentation

## 2018-12-17 DIAGNOSIS — I110 Hypertensive heart disease with heart failure: Secondary | ICD-10-CM | POA: Diagnosis not present

## 2018-12-17 DIAGNOSIS — I480 Paroxysmal atrial fibrillation: Secondary | ICD-10-CM | POA: Diagnosis not present

## 2018-12-17 DIAGNOSIS — Z79899 Other long term (current) drug therapy: Secondary | ICD-10-CM | POA: Diagnosis not present

## 2018-12-17 DIAGNOSIS — I5022 Chronic systolic (congestive) heart failure: Secondary | ICD-10-CM | POA: Insufficient documentation

## 2018-12-17 DIAGNOSIS — R0683 Snoring: Secondary | ICD-10-CM | POA: Insufficient documentation

## 2018-12-17 DIAGNOSIS — Z7901 Long term (current) use of anticoagulants: Secondary | ICD-10-CM | POA: Insufficient documentation

## 2018-12-17 DIAGNOSIS — J449 Chronic obstructive pulmonary disease, unspecified: Secondary | ICD-10-CM | POA: Diagnosis not present

## 2018-12-17 DIAGNOSIS — Z8674 Personal history of sudden cardiac arrest: Secondary | ICD-10-CM | POA: Diagnosis not present

## 2018-12-17 DIAGNOSIS — I428 Other cardiomyopathies: Secondary | ICD-10-CM | POA: Diagnosis not present

## 2018-12-17 DIAGNOSIS — Z6841 Body Mass Index (BMI) 40.0 and over, adult: Secondary | ICD-10-CM | POA: Diagnosis not present

## 2018-12-17 DIAGNOSIS — E039 Hypothyroidism, unspecified: Secondary | ICD-10-CM | POA: Insufficient documentation

## 2018-12-17 DIAGNOSIS — M1990 Unspecified osteoarthritis, unspecified site: Secondary | ICD-10-CM | POA: Insufficient documentation

## 2018-12-17 DIAGNOSIS — I482 Chronic atrial fibrillation, unspecified: Secondary | ICD-10-CM | POA: Diagnosis present

## 2018-12-17 DIAGNOSIS — F419 Anxiety disorder, unspecified: Secondary | ICD-10-CM | POA: Insufficient documentation

## 2018-12-17 DIAGNOSIS — I5042 Chronic combined systolic (congestive) and diastolic (congestive) heart failure: Secondary | ICD-10-CM

## 2018-12-17 DIAGNOSIS — Z8249 Family history of ischemic heart disease and other diseases of the circulatory system: Secondary | ICD-10-CM | POA: Diagnosis not present

## 2018-12-17 DIAGNOSIS — Z79890 Hormone replacement therapy (postmenopausal): Secondary | ICD-10-CM | POA: Insufficient documentation

## 2018-12-17 DIAGNOSIS — Z95810 Presence of automatic (implantable) cardiac defibrillator: Secondary | ICD-10-CM | POA: Diagnosis not present

## 2018-12-17 DIAGNOSIS — I252 Old myocardial infarction: Secondary | ICD-10-CM | POA: Insufficient documentation

## 2018-12-17 NOTE — Telephone Encounter (Signed)
CSW contacted patient to inform the last delivery of the [employer] Summer Covid relief program will be July 31st. Patient reports she never received the [employer]oms meals although grateful for the previous covid relief program. Patient denies any other concerns at this time. CSW continues to be available if needed.  Raquel Sarna, [place], [place]

## 2018-12-17 NOTE — Progress Notes (Signed)
Patient Name:         DOB:       Height:     Weight:  Office Name:         Referring Provider:  Today's Date:  Date:   STOP BANG RISK ASSESSMENT S (snore) Have you been told that you snore?     YES   T (tired) Are you often tired, fatigued, or sleepy during the day?   YES  O (obstruction) Do you stop breathing, choke, or gasp during sleep? NO   P (pressure) Do you have or are you being treated for high blood pressure? YES   B (BMI) Is your body index greater than 35 kg/m? YES   A (age) Are you 63 years old or older? YES   N (neck) Do you have a neck circumference greater than 16 inches?   YES   G (gender) Are you a female? NO   TOTAL STOP/BANG "YES" ANSWERS                                                                        For Office Use Only              Procedure Order Form    YES to 3+ Stop Bang questions OR two clinical symptoms - patient qualifies for WatchPAT (CPT 95800)     Submit: This Form + Patient Face Sheet + Clinical Note via CloudPAT or Fax: [phone]         Clinical Notes: Will consult Sleep Specialist and refer for management of therapy due to patient increased risk of Sleep Apnea. Ordering a sleep study due to the following two clinical symptoms: Excessive daytime sleepiness G47.10 / Loud snoring R06.83.   I understand that I am proceeding with a home sleep apnea test as ordered by my treating physician. I understand that untreated sleep apnea is a serious cardiovascular risk factor and it is my responsibility to perform the test and seek management for sleep apnea. I will be contacted with the results and be managed for sleep apnea by a local sleep physician. I will be receiving equipment and further instructions from [facility]. I shall promptly ship back the equipment via the included mailing label. I understand my insurance will be billed for the test and as the patient I am responsible for any insurance related out-of-pocket costs incurred. I  have been provided with written instructions and can call for additional video or telephonic instruction, with 24-hour availability of qualified personnel to answer any questions: Patient Help Desk [phone].

## 2018-12-17 NOTE — Progress Notes (Signed)
PCP: Dr Philipp Deputy Primary Cardiologist: Dr Tomie China HF MD: Dr Shirlee Latch   HPI: Carrie Walters is a 63 y.o. female with a history of chronic atrial fibrillation, hypothyroidism, COPD, HTN, morbid obesity, VF arrest 02/21/18, and systolic heart failure due to NICM (diagnosed 02/2018)  Admitted 10/3-10/11/19. She had a VF arrest at [facility] on 02/21/18. She received CPR, 1 amp epi, and shock x1 with ROSC. She was then in Afib RVR and started on IV amio prior to transfer to [facility]. Echo showed newly reduced EF 25-30%. She had a LHC, which showed normal coronaries. EP was consulted and thought hypokalemia may have triggered VF arrest. She was transitioned to PO amio and given a LifeVest at DC. Unable to place ICD due to active leg wound. She required diuresis with IV lasix and then transitioned to lasix 40 mg daily. HF meds were optimized. DC weight 273 lbs.   Admitted to [facility] on 10/24 from [facility] increased dyspnea and respiratory failure. Hypotensive so entresto, coreg, and lasix held. Underwent TEE and DC-CV with restoration of NSR. She continued on amiodarone 200 mg twice a day. Discharged with Life Vest back on. Needs ICD but due to lower extremity wounds this has been delayed until the wounds heal. Discharge on 10/29.   She returned to [facility] 03/31/18 with increased shortness of breath. She was given prednisone and discharged to home.  She was to start prednisone bur has not picked up because she was unable to pick up from the pharmacy.  S/P Medtronic Dual Chamber Implant 10/2018    Today she returns for HF follow up. Overall feeling much better. Remains SOB with moderate exertion.  Denies PND/Orthopnea. Appetite ok. No fever or chills. She has not been weighing at home. Taking all medications.   Cardiac Studies   03/18/18 S/P DC-CV 200J --> NSR   03/18/18 TEE: EF 30-35% with mild LV dilation, RV mild to moderately dilated with mild to moderately decreased systolic function.    07/2018 EF 60-65%    02/2018 LHC /RHC Normal coronaries EF 20%   Review of systems complete and found to be negative unless listed in HPI.    SH:  Social History   Socioeconomic History  . Marital status: Single    Spouse name: Not on file  . Number of children: 1  . Years of education: Not on file  . Highest education level: 12th grade  Occupational History  . Not on file  Social Needs  . Financial resource strain: Somewhat hard  . Food insecurity    Worry: Never true    Inability: Never true  . Transportation needs    Medical: Yes    Non-medical: No  Tobacco Use  . Smoking status: Former Games developer  . Smokeless tobacco: Never Used  Substance and Sexual Activity  . Alcohol use: Never    Frequency: Never  . Drug use: Never  . Sexual activity: Not on file  Lifestyle  . Physical activity    Days per week: Not on file    Minutes per session: Not on file  . Stress: Not on file  Relationships  . Social Musician on phone: Not on file    Gets together: Not on file    Attends religious service: Not on file    Active member of club or organization: Not on file    Attends meetings of clubs or organizations: Not on file    Relationship status: Not on file  .  Intimate partner violence    Fear of current or ex partner: Not on file    Emotionally abused: Not on file    Physically abused: Not on file    Forced sexual activity: Not on file  Other Topics Concern  . Not on file  Social History Narrative   [place]    FH:  Family History  Problem Relation Age of Onset  . Heart disease Father   . Atrial fibrillation Brother     Past Medical History:  Diagnosis Date  . Anxiety   . Arthritis   . Asthma   . Atrial fibrillation (HCC)   . Cellulitis   . Dysrhythmia   . Hypertension   . Hypothyroidism   . MI (myocardial infarction) [facility](HCC)     Current Outpatient Medications  Medication Sig Dispense Refill  . albuterol (PROVENTIL HFA;VENTOLIN  HFA) 108 (90 Base) MCG/ACT inhaler Inhale 2 puffs into the lungs every 6 (six) hours as needed for wheezing or shortness of breath.    Marland Kitchen. amiodarone (PACERONE) 200 MG tablet Take 1 tablet (200 mg total) by mouth at bedtime. 30 tablet 6  . apixaban (ELIQUIS) 5 MG TABS tablet Take 1 tablet (5 mg total) by mouth 2 (two) times daily. Restart 6/19 pm dose 60 tablet 11  . bisoprolol (ZEBETA) 5 MG tablet Take 1 tablet (5 mg total) by mouth daily. 30 tablet 5  . fluticasone (FLONASE) 50 MCG/ACT nasal spray Place 1 spray into both nostrils daily as needed for allergies or rhinitis.    . furosemide (LASIX) 40 MG tablet Take 1 tablet (40 mg total) by mouth 2 (two) times daily. TAKE ADDITIONAL 1/2 TAB FOR EDEMA OR DYSPNEA (Patient taking differently: Take 40 mg by mouth 2 (two) times daily. ) 180 tablet 3  . KLOR-CON M20 20 MEQ tablet TAKE 1 TABLET BY MOUTH EVERY DAY (Patient taking differently: Take 20 mEq by mouth daily. ) 30 tablet 5  . levothyroxine (SYNTHROID, LEVOTHROID) 50 MCG tablet Take 1 tablet (50 mcg total) by mouth daily before breakfast. 30 tablet 6  . spironolactone (ALDACTONE) 25 MG tablet Take 1 tablet (25 mg total) by mouth daily. 30 tablet 6   No current facility-administered medications for this encounter.    Vitals:   12/17/18 1149  BP: 124/70  Pulse: 65  SpO2: 98%  Weight: 114.6 kg (252 lb 9.6 oz)    Wt Readings from Last 3 Encounters:  12/17/18 114.6 kg (252 lb 9.6 oz)  11/06/18 115.3 kg (254 lb 3.2 oz)  10/25/18 117.9 kg (260 lb)   PHYSICAL EXAM: General:  . No resp difficulty HEENT: normal Neck: supple. JVP 5-6 . Carotids 2+ bilat; no bruits. No lymphadenopathy or thryomegaly appreciated. Cor: PMI nondisplaced. Regular rate & rhythm. No rubs, gallops or murmurs. Left upper chest scar Lungs: clear Abdomen: soft, nontender, nondistended. No hepatosplenomegaly. No bruits or masses. Good bowel sounds. Extremities: no cyanosis, clubbing, rash, edema Neuro: alert & orientedx3,  cranial nerves grossly intact. moves all 4 extremities w/o difficulty. Affect pleasant   ASSESSMENT & PLAN: 1. Chronic Systolic Heart Failure, NICM. LHC 02/2018 normal cors. ECHO, limited  02/22/2018 EF 25-30%-->07/2018 60-65%   NYHA II-III symptoms. Functional improvement.  Volume status stable.  - Continue lasix 40 mg/40 mg daily.  - Continue bisoprolol 5 mg daily  - Continue 25 mg spironolactone daily.  - Continue losartan 25 mg daily. She failed Entresto with hypotension. -  2. PAF S/P DC-CV 02/2018 - Regular on  exam. - Continue amiodarone 200 mg daily.  - Continue eliquis 5 mg BID.  3. H/O VF Arrest 02/2018 -S/P Medtronic Dual Chamber ICD.    4. Hypothyroidism  - On Synthroid.  -Per PCP  5. COPD   -Sats stable on room air.   6. Obesity  - Body mass index is 40.77 kg/m.   7. Snore Set up home sleep study   Follow up 6 months with Dr Aundra Dubin.    Darrick Grinder, NP  12:18 PM

## 2018-12-17 NOTE — Patient Instructions (Signed)
Your provider has recommended that you have a home sleep study.  [employer] is the company that provides these and will send the equipment right to your home with instructions on how to set it up.  Once you have completed the test you just dispose of the equipment, the information is automatically uploaded to [place].  IF you have any questions or issues with the equipment please call the company.  If your test was positive and you need a home CPAP machine you will be contacted by Dr Theodosia Blender office [facility]) to set this up.   Please follow up with the [facility] in 6 months.  At the [facility], you and your health needs are our priority. As part of our continuing mission to provide you with exceptional heart care, we have created designated Provider Care Teams. These Care Teams include your primary Cardiologist (physician) and Advanced Practice Providers (APPs- Physician Assistants and Nurse Practitioners) who all work together to provide you with the care you need, when you need it.   You may see any of the following providers on your designated Care Team at your next follow up: Marland Kitchen Dr Glori Bickers . Dr Loralie Champagne . Darrick Grinder, NP

## 2018-12-27 ENCOUNTER — Encounter (HOSPITAL_COMMUNITY): Payer: Medicaid Other | Admitting: Internal Medicine

## 2019-02-05 ENCOUNTER — Encounter: Payer: Medicaid Other | Admitting: *Deleted

## 2019-02-07 ENCOUNTER — Encounter: Payer: Medicaid Other | Admitting: Internal Medicine

## 2019-02-11 ENCOUNTER — Encounter: Payer: Self-pay | Admitting: Cardiology

## 2019-04-04 ENCOUNTER — Ambulatory Visit (INDEPENDENT_AMBULATORY_CARE_PROVIDER_SITE_OTHER): Payer: Medicaid Other | Admitting: *Deleted

## 2019-04-04 DIAGNOSIS — I480 Paroxysmal atrial fibrillation: Secondary | ICD-10-CM

## 2019-04-04 DIAGNOSIS — I5032 Chronic diastolic (congestive) heart failure: Secondary | ICD-10-CM

## 2019-04-05 LAB — CUP PACEART REMOTE DEVICE CHECK
Battery Remaining Longevity: 126 mo
Battery Voltage: 3.04 V
Brady Statistic AP VP Percent: 0.01 %
Brady Statistic AP VS Percent: 33.54 %
Brady Statistic AS VP Percent: 0.03 %
Brady Statistic AS VS Percent: 66.42 %
Brady Statistic RA Percent Paced: 33.55 %
Brady Statistic RV Percent Paced: 0.04 %
Date Time Interrogation Session: 20201114004131
HighPow Impedance: 69 Ohm
Implantable Lead Implant Date: 20200616
Implantable Lead Implant Date: 20200616
Implantable Lead Location: 753859
Implantable Lead Location: 753860
Implantable Lead Model: 5076
Implantable Pulse Generator Implant Date: 20200616
Lead Channel Impedance Value: 380 Ohm
Lead Channel Impedance Value: 494 Ohm
Lead Channel Impedance Value: 513 Ohm
Lead Channel Pacing Threshold Amplitude: 0.75 V
Lead Channel Pacing Threshold Amplitude: 0.75 V
Lead Channel Pacing Threshold Pulse Width: 0.4 ms
Lead Channel Pacing Threshold Pulse Width: 0.4 ms
Lead Channel Sensing Intrinsic Amplitude: 3.5 mV
Lead Channel Sensing Intrinsic Amplitude: 3.5 mV
Lead Channel Sensing Intrinsic Amplitude: 9.125 mV
Lead Channel Sensing Intrinsic Amplitude: 9.125 mV
Lead Channel Setting Pacing Amplitude: 1.5 V
Lead Channel Setting Pacing Amplitude: 2 V
Lead Channel Setting Pacing Pulse Width: 0.4 ms
Lead Channel Setting Sensing Sensitivity: 0.3 mV

## 2019-04-13 ENCOUNTER — Encounter (HOSPITAL_COMMUNITY): Payer: Self-pay | Admitting: Emergency Medicine

## 2019-04-13 ENCOUNTER — Emergency Department (HOSPITAL_COMMUNITY): Payer: Medicaid Other

## 2019-04-13 ENCOUNTER — Inpatient Hospital Stay (HOSPITAL_COMMUNITY)
Admission: EM | Admit: 2019-04-13 | Discharge: 2019-04-16 | DRG: 190 | Disposition: A | Discharge status: Home / Self Care | Payer: Medicaid Other | Attending: Student | Admitting: Student

## 2019-04-13 ENCOUNTER — Other Ambulatory Visit: Payer: Self-pay

## 2019-04-13 DIAGNOSIS — Z792 Long term (current) use of antibiotics: Secondary | ICD-10-CM

## 2019-04-13 DIAGNOSIS — Z95810 Presence of automatic (implantable) cardiac defibrillator: Secondary | ICD-10-CM

## 2019-04-13 DIAGNOSIS — I130 Hypertensive heart and chronic kidney disease with heart failure and stage 1 through stage 4 chronic kidney disease, or unspecified chronic kidney disease: Secondary | ICD-10-CM | POA: Diagnosis present

## 2019-04-13 DIAGNOSIS — Z8674 Personal history of sudden cardiac arrest: Secondary | ICD-10-CM

## 2019-04-13 DIAGNOSIS — I480 Paroxysmal atrial fibrillation: Secondary | ICD-10-CM | POA: Diagnosis present

## 2019-04-13 DIAGNOSIS — I2510 Atherosclerotic heart disease of native coronary artery without angina pectoris: Secondary | ICD-10-CM | POA: Diagnosis present

## 2019-04-13 DIAGNOSIS — Z79899 Other long term (current) drug therapy: Secondary | ICD-10-CM

## 2019-04-13 DIAGNOSIS — J9620 Acute and chronic respiratory failure, unspecified whether with hypoxia or hypercapnia: Secondary | ICD-10-CM | POA: Diagnosis present

## 2019-04-13 DIAGNOSIS — J9621 Acute and chronic respiratory failure with hypoxia: Secondary | ICD-10-CM | POA: Diagnosis present

## 2019-04-13 DIAGNOSIS — Z6841 Body Mass Index (BMI) 40.0 and over, adult: Secondary | ICD-10-CM

## 2019-04-13 DIAGNOSIS — I454 Nonspecific intraventricular block: Secondary | ICD-10-CM | POA: Diagnosis present

## 2019-04-13 DIAGNOSIS — Z8249 Family history of ischemic heart disease and other diseases of the circulatory system: Secondary | ICD-10-CM

## 2019-04-13 DIAGNOSIS — N1831 Chronic kidney disease, stage 3a: Secondary | ICD-10-CM | POA: Diagnosis present

## 2019-04-13 DIAGNOSIS — Z888 Allergy status to other drugs, medicaments and biological substances status: Secondary | ICD-10-CM

## 2019-04-13 DIAGNOSIS — Z79890 Hormone replacement therapy (postmenopausal): Secondary | ICD-10-CM

## 2019-04-13 DIAGNOSIS — Z9981 Dependence on supplemental oxygen: Secondary | ICD-10-CM

## 2019-04-13 DIAGNOSIS — Z9049 Acquired absence of other specified parts of digestive tract: Secondary | ICD-10-CM

## 2019-04-13 DIAGNOSIS — Z881 Allergy status to other antibiotic agents status: Secondary | ICD-10-CM

## 2019-04-13 DIAGNOSIS — I252 Old myocardial infarction: Secondary | ICD-10-CM

## 2019-04-13 DIAGNOSIS — Z87891 Personal history of nicotine dependence: Secondary | ICD-10-CM

## 2019-04-13 DIAGNOSIS — Z7901 Long term (current) use of anticoagulants: Secondary | ICD-10-CM

## 2019-04-13 DIAGNOSIS — J441 Chronic obstructive pulmonary disease with (acute) exacerbation: Secondary | ICD-10-CM | POA: Diagnosis present

## 2019-04-13 DIAGNOSIS — I255 Ischemic cardiomyopathy: Secondary | ICD-10-CM | POA: Diagnosis present

## 2019-04-13 DIAGNOSIS — Z91048 Other nonmedicinal substance allergy status: Secondary | ICD-10-CM

## 2019-04-13 DIAGNOSIS — Z91041 Radiographic dye allergy status: Secondary | ICD-10-CM

## 2019-04-13 DIAGNOSIS — Z20828 Contact with and (suspected) exposure to other viral communicable diseases: Secondary | ICD-10-CM | POA: Diagnosis present

## 2019-04-13 DIAGNOSIS — Z91040 Latex allergy status: Secondary | ICD-10-CM

## 2019-04-13 DIAGNOSIS — E039 Hypothyroidism, unspecified: Secondary | ICD-10-CM | POA: Diagnosis present

## 2019-04-13 DIAGNOSIS — N183 Chronic kidney disease, stage 3 unspecified: Secondary | ICD-10-CM | POA: Diagnosis present

## 2019-04-13 DIAGNOSIS — I5032 Chronic diastolic (congestive) heart failure: Secondary | ICD-10-CM | POA: Diagnosis present

## 2019-04-13 DIAGNOSIS — J9611 Chronic respiratory failure with hypoxia: Secondary | ICD-10-CM

## 2019-04-13 LAB — CBC WITH DIFFERENTIAL/PLATELET
Abs Immature Granulocytes: 0.05 10*3/uL (ref 0.00–0.07)
Basophils Absolute: 0.1 10*3/uL (ref 0.0–0.1)
Basophils Relative: 1 %
Eosinophils Absolute: 0 10*3/uL (ref 0.0–0.5)
Eosinophils Relative: 0 %
HCT: 44 % (ref 36.0–46.0)
Hemoglobin: 14.3 g/dL (ref 12.0–15.0)
Immature Granulocytes: 0 %
Lymphocytes Relative: 22 %
Lymphs Abs: 2.6 10*3/uL (ref 0.7–4.0)
MCH: 29.7 pg (ref 26.0–34.0)
MCHC: 32.5 g/dL (ref 30.0–36.0)
MCV: 91.3 fL (ref 80.0–100.0)
Monocytes Absolute: 0.6 10*3/uL (ref 0.1–1.0)
Monocytes Relative: 5 %
Neutro Abs: 8.4 10*3/uL — ABNORMAL HIGH (ref 1.7–7.7)
Neutrophils Relative %: 72 %
Platelets: 283 10*3/uL (ref 150–400)
RBC: 4.82 MIL/uL (ref 3.87–5.11)
RDW: 14.3 % (ref 11.5–15.5)
WBC: 11.8 10*3/uL — ABNORMAL HIGH (ref 4.0–10.5)
nRBC: 0 % (ref 0.0–0.2)

## 2019-04-13 LAB — POC SARS CORONAVIRUS 2 AG -  ED: SARS Coronavirus 2 Ag: NEGATIVE

## 2019-04-13 MED ORDER — DEXAMETHASONE SODIUM PHOSPHATE 10 MG/ML IJ SOLN
10.0000 mg | Freq: Once | INTRAMUSCULAR | Status: AC
  Administered 2019-04-13: 10 mg via INTRAVENOUS
  Filled 2019-04-13: qty 1

## 2019-04-13 MED ORDER — AEROCHAMBER PLUS FLO-VU LARGE MISC
Status: AC
  Filled 2019-04-13: qty 1

## 2019-04-13 MED ORDER — ALBUTEROL SULFATE HFA 108 (90 BASE) MCG/ACT IN AERS: 6.0000 | INHALATION_SPRAY | RESPIRATORY_TRACT | Status: DC | PRN

## 2019-04-13 MED ORDER — IPRATROPIUM BROMIDE HFA 17 MCG/ACT IN AERS
2.0000 | INHALATION_SPRAY | Freq: Once | RESPIRATORY_TRACT | Status: AC
  Administered 2019-04-13: 2 via RESPIRATORY_TRACT
  Filled 2019-04-13: qty 12.9

## 2019-04-13 NOTE — ED Provider Notes (Signed)
11:50 PM  Assumed care from Dr. Kathrynn Humble.  Patient is a 63 y.o. F who presents to ED with SOB.  H/o MI, CHF, COPD.  Tripoding on EMS arrival.  Wheezing here and with EMS.  Getting albuterol and atrovent and improving.  On 3L [place] O2 chronically.  Labs pending.  CXR clear.  Rapid antigen COVID test negative.  Will need reassess to determine dispo.  Has bilateral LE redness and swelling worse L > R that she reports is chronic.  No warmth on exam.  Recent negative LE doppler per her report.  States Doppler was done at [facility].  On Eliquis for a fib.  Plan was to likely dc on prednisone and doxycycline per previous EDP.  1:35 AM  Pt now wheezing more after nebulizer treatment.  Will give second treatment and reassess.  Patient's BNP is normal.  She denies any chest pain.  3:15 AM  Pt's aeration continues to improve but with that her wheezing is worse.  She is not tachypneic or hypoxic on her normal 3 L nasal cannula.  Will give third nebulizer treatment and if no significant improvement, anticipate admission for COPD exacerbation.  4:25 AM  Pt still wheezing significantly after multiple albuterol and Atrovent treatments.  Has received IV steroids.  Appears short of breath with minimal movement in the bed and with getting up to the bedside commode.  Doing well however from an oxygen saturation standpoint on her normal 3 L nasal cannula.  Recommended admission for COPD exacerbation.  She agrees with plan.  PCP - Dr. Sarah Martinique in [place]   4:39 AM  Discussed patient's case with hospitalist, Dr. Myna Hidalgo.  I have recommended admission and patient (and family if present) agree with this plan. Admitting physician will place admission orders.   I have also ordered venous Dopplers of bilateral lower extremities to be done in the morning given her bilateral lower extremity swelling worse on the left than the right.  Hospitalist aware.  I reviewed all nursing notes, vitals, pertinent previous records  and interpreted all EKGs, lab and urine results, imaging (as available).   CRITICAL CARE Performed by: Pryor Curia   Total critical care time: 45 minutes  Critical care time was exclusive of separately billable procedures and treating other patients.  Critical care was necessary to treat or prevent imminent or life-threatening deterioration.  Critical care was time spent personally by me on the following activities: development of treatment plan with patient and/or surrogate as well as nursing, discussions with consultants, evaluation of patient's response to treatment, examination of patient, obtaining history from patient or surrogate, ordering and performing treatments and interventions, ordering and review of laboratory studies, ordering and review of radiographic studies, pulse oximetry and re-evaluation of patient's condition.    Najeeb Uptain, Delice Bison, DO 04/14/19 [phone]

## 2019-04-13 NOTE — ED Triage Notes (Signed)
Pt transported from home by EMS, pt transported for [facility], pt unable to tolerate NRB. Pt 78 % on 3L, pt is on home 02. Cough noted x 2 weeks, productive. Pt reports she has been out of her inhaler, 18 IV est by EMS.

## 2019-04-13 NOTE — ED Notes (Signed)
Pt. Feels that she is at baseline as it relates to breathing. She denies pain but states her mid chest/lung area is irraated in the front and back

## 2019-04-14 ENCOUNTER — Observation Stay (HOSPITAL_COMMUNITY): Payer: Medicaid Other

## 2019-04-14 ENCOUNTER — Encounter (HOSPITAL_COMMUNITY): Payer: Self-pay | Admitting: Family Medicine

## 2019-04-14 DIAGNOSIS — I5032 Chronic diastolic (congestive) heart failure: Secondary | ICD-10-CM | POA: Diagnosis present

## 2019-04-14 DIAGNOSIS — Z792 Long term (current) use of antibiotics: Secondary | ICD-10-CM | POA: Diagnosis not present

## 2019-04-14 DIAGNOSIS — I130 Hypertensive heart and chronic kidney disease with heart failure and stage 1 through stage 4 chronic kidney disease, or unspecified chronic kidney disease: Secondary | ICD-10-CM | POA: Diagnosis present

## 2019-04-14 DIAGNOSIS — N1831 Chronic kidney disease, stage 3a: Secondary | ICD-10-CM | POA: Diagnosis present

## 2019-04-14 DIAGNOSIS — I252 Old myocardial infarction: Secondary | ICD-10-CM | POA: Diagnosis not present

## 2019-04-14 DIAGNOSIS — Z79890 Hormone replacement therapy (postmenopausal): Secondary | ICD-10-CM | POA: Diagnosis not present

## 2019-04-14 DIAGNOSIS — M7989 Other specified soft tissue disorders: Secondary | ICD-10-CM

## 2019-04-14 DIAGNOSIS — J9621 Acute and chronic respiratory failure with hypoxia: Secondary | ICD-10-CM | POA: Diagnosis present

## 2019-04-14 DIAGNOSIS — I255 Ischemic cardiomyopathy: Secondary | ICD-10-CM | POA: Diagnosis present

## 2019-04-14 DIAGNOSIS — I480 Paroxysmal atrial fibrillation: Secondary | ICD-10-CM | POA: Diagnosis present

## 2019-04-14 DIAGNOSIS — E039 Hypothyroidism, unspecified: Secondary | ICD-10-CM | POA: Diagnosis present

## 2019-04-14 DIAGNOSIS — N183 Chronic kidney disease, stage 3 unspecified: Secondary | ICD-10-CM | POA: Diagnosis present

## 2019-04-14 DIAGNOSIS — J441 Chronic obstructive pulmonary disease with (acute) exacerbation: Secondary | ICD-10-CM | POA: Diagnosis not present

## 2019-04-14 DIAGNOSIS — Z9049 Acquired absence of other specified parts of digestive tract: Secondary | ICD-10-CM | POA: Diagnosis not present

## 2019-04-14 DIAGNOSIS — Z6841 Body Mass Index (BMI) 40.0 and over, adult: Secondary | ICD-10-CM | POA: Diagnosis not present

## 2019-04-14 DIAGNOSIS — Z8674 Personal history of sudden cardiac arrest: Secondary | ICD-10-CM | POA: Diagnosis not present

## 2019-04-14 DIAGNOSIS — Z9981 Dependence on supplemental oxygen: Secondary | ICD-10-CM | POA: Diagnosis not present

## 2019-04-14 DIAGNOSIS — Z87891 Personal history of nicotine dependence: Secondary | ICD-10-CM | POA: Diagnosis not present

## 2019-04-14 DIAGNOSIS — J9611 Chronic respiratory failure with hypoxia: Secondary | ICD-10-CM | POA: Diagnosis not present

## 2019-04-14 DIAGNOSIS — J9620 Acute and chronic respiratory failure, unspecified whether with hypoxia or hypercapnia: Secondary | ICD-10-CM | POA: Diagnosis present

## 2019-04-14 DIAGNOSIS — Z7901 Long term (current) use of anticoagulants: Secondary | ICD-10-CM | POA: Diagnosis not present

## 2019-04-14 DIAGNOSIS — R0602 Shortness of breath: Secondary | ICD-10-CM | POA: Diagnosis not present

## 2019-04-14 DIAGNOSIS — Z79899 Other long term (current) drug therapy: Secondary | ICD-10-CM | POA: Diagnosis not present

## 2019-04-14 DIAGNOSIS — Z8249 Family history of ischemic heart disease and other diseases of the circulatory system: Secondary | ICD-10-CM | POA: Diagnosis not present

## 2019-04-14 DIAGNOSIS — Z20828 Contact with and (suspected) exposure to other viral communicable diseases: Secondary | ICD-10-CM | POA: Diagnosis present

## 2019-04-14 DIAGNOSIS — Z95810 Presence of automatic (implantable) cardiac defibrillator: Secondary | ICD-10-CM | POA: Diagnosis not present

## 2019-04-14 DIAGNOSIS — I454 Nonspecific intraventricular block: Secondary | ICD-10-CM | POA: Diagnosis present

## 2019-04-14 DIAGNOSIS — I2510 Atherosclerotic heart disease of native coronary artery without angina pectoris: Secondary | ICD-10-CM | POA: Diagnosis present

## 2019-04-14 LAB — CBC WITH DIFFERENTIAL/PLATELET
Abs Immature Granulocytes: 0.05 10*3/uL (ref 0.00–0.07)
Basophils Absolute: 0 10*3/uL (ref 0.0–0.1)
Basophils Relative: 0 %
Eosinophils Absolute: 0 10*3/uL (ref 0.0–0.5)
Eosinophils Relative: 0 %
HCT: 42.9 % (ref 36.0–46.0)
Hemoglobin: 13.8 g/dL (ref 12.0–15.0)
Immature Granulocytes: 1 %
Lymphocytes Relative: 7 %
Lymphs Abs: 0.5 10*3/uL — ABNORMAL LOW (ref 0.7–4.0)
MCH: 29.2 pg (ref 26.0–34.0)
MCHC: 32.2 g/dL (ref 30.0–36.0)
MCV: 90.9 fL (ref 80.0–100.0)
Monocytes Absolute: 0.1 10*3/uL (ref 0.1–1.0)
Monocytes Relative: 1 %
Neutro Abs: 6.3 10*3/uL (ref 1.7–7.7)
Neutrophils Relative %: 91 %
Platelets: 300 10*3/uL (ref 150–400)
RBC: 4.72 MIL/uL (ref 3.87–5.11)
RDW: 14.1 % (ref 11.5–15.5)
WBC: 6.9 10*3/uL (ref 4.0–10.5)
nRBC: 0 % (ref 0.0–0.2)

## 2019-04-14 LAB — BASIC METABOLIC PANEL
Anion gap: 10 (ref 5–15)
Anion gap: 15 (ref 5–15)
BUN: 10 mg/dL (ref 8–23)
BUN: 11 mg/dL (ref 8–23)
CO2: 24 mmol/L (ref 22–32)
CO2: 28 mmol/L (ref 22–32)
Calcium: 10 mg/dL (ref 8.9–10.3)
Calcium: 9.6 mg/dL (ref 8.9–10.3)
Chloride: 98 mmol/L (ref 98–111)
Chloride: 98 mmol/L (ref 98–111)
Creatinine, Ser: 1.29 mg/dL — ABNORMAL HIGH (ref 0.44–1.00)
Creatinine, Ser: 1.44 mg/dL — ABNORMAL HIGH (ref 0.44–1.00)
GFR calc Af Amer: 45 mL/min — ABNORMAL LOW (ref >60)
GFR calc Af Amer: 51 mL/min — ABNORMAL LOW (ref >60)
GFR calc non Af Amer: 39 mL/min — ABNORMAL LOW (ref >60)
GFR calc non Af Amer: 44 mL/min — ABNORMAL LOW (ref >60)
Glucose, Bld: 166 mg/dL — ABNORMAL HIGH (ref 70–99)
Glucose, Bld: 177 mg/dL — ABNORMAL HIGH (ref 70–99)
Potassium: 3.8 mmol/L (ref 3.5–5.1)
Potassium: 4.6 mmol/L (ref 3.5–5.1)
Sodium: 136 mmol/L (ref 135–145)
Sodium: 137 mmol/L (ref 135–145)

## 2019-04-14 LAB — HIV ANTIBODY (ROUTINE TESTING W REFLEX): HIV Screen 4th Generation wRfx: NONREACTIVE

## 2019-04-14 LAB — SARS CORONAVIRUS 2 (TAT 6-24 HRS): SARS Coronavirus 2: NEGATIVE

## 2019-04-14 LAB — BRAIN NATRIURETIC PEPTIDE: B Natriuretic Peptide: 51.4 pg/mL (ref 0.0–100.0)

## 2019-04-14 LAB — GLUCOSE, CAPILLARY: Glucose-Capillary: 177 mg/dL — ABNORMAL HIGH (ref 70–99)

## 2019-04-14 MED ORDER — BISOPROLOL FUMARATE 5 MG PO TABS
5.0000 mg | ORAL_TABLET | Freq: Every day | ORAL | Status: DC
  Administered 2019-04-14 – 2019-04-16 (×3): 5 mg via ORAL
  Filled 2019-04-14 (×3): qty 1

## 2019-04-14 MED ORDER — AMIODARONE HCL 200 MG PO TABS
200.0000 mg | ORAL_TABLET | Freq: Every day | ORAL | Status: DC
  Administered 2019-04-14 – 2019-04-15 (×2): 200 mg via ORAL
  Filled 2019-04-14 (×2): qty 1

## 2019-04-14 MED ORDER — ALBUTEROL SULFATE (2.5 MG/3ML) 0.083% IN NEBU: 2.5000 mg | INHALATION_SOLUTION | RESPIRATORY_TRACT | Status: DC | PRN

## 2019-04-14 MED ORDER — SODIUM CHLORIDE 0.9% FLUSH
3.0000 mL | Freq: Two times a day (BID) | INTRAVENOUS | Status: DC
  Administered 2019-04-14 – 2019-04-16 (×4): 3 mL via INTRAVENOUS

## 2019-04-14 MED ORDER — SPIRONOLACTONE 25 MG PO TABS
25.0000 mg | ORAL_TABLET | Freq: Every day | ORAL | Status: DC
  Administered 2019-04-14 – 2019-04-16 (×3): 25 mg via ORAL
  Filled 2019-04-14 (×3): qty 1

## 2019-04-14 MED ORDER — ACETAMINOPHEN 325 MG PO TABS
650.0000 mg | ORAL_TABLET | Freq: Four times a day (QID) | ORAL | Status: DC | PRN
  Administered 2019-04-15 (×2): 650 mg via ORAL
  Filled 2019-04-14 (×2): qty 2

## 2019-04-14 MED ORDER — IPRATROPIUM BROMIDE 0.02 % IN SOLN
0.5000 mg | Freq: Once | RESPIRATORY_TRACT | Status: AC
  Administered 2019-04-14: 0.5 mg via RESPIRATORY_TRACT
  Filled 2019-04-14: qty 2.5

## 2019-04-14 MED ORDER — METHYLPREDNISOLONE SODIUM SUCC 125 MG IJ SOLR
60.0000 mg | Freq: Four times a day (QID) | INTRAMUSCULAR | Status: DC
  Administered 2019-04-14 – 2019-04-15 (×5): 60 mg via INTRAVENOUS
  Filled 2019-04-14 (×5): qty 2

## 2019-04-14 MED ORDER — SENNOSIDES-DOCUSATE SODIUM 8.6-50 MG PO TABS: 1.0000 | ORAL_TABLET | Freq: Every evening | ORAL | Status: DC | PRN

## 2019-04-14 MED ORDER — IPRATROPIUM-ALBUTEROL 0.5-2.5 (3) MG/3ML IN SOLN
3.0000 mL | Freq: Three times a day (TID) | RESPIRATORY_TRACT | Status: DC
  Administered 2019-04-14 – 2019-04-16 (×7): 3 mL via RESPIRATORY_TRACT
  Filled 2019-04-14 (×7): qty 3

## 2019-04-14 MED ORDER — APIXABAN 5 MG PO TABS
5.0000 mg | ORAL_TABLET | Freq: Two times a day (BID) | ORAL | Status: DC
  Administered 2019-04-14 – 2019-04-16 (×5): 5 mg via ORAL
  Filled 2019-04-14 (×5): qty 1

## 2019-04-14 MED ORDER — ONDANSETRON 4 MG PO TBDP
4.0000 mg | ORAL_TABLET | Freq: Once | ORAL | Status: DC
  Filled 2019-04-14: qty 1

## 2019-04-14 MED ORDER — ALBUTEROL SULFATE (2.5 MG/3ML) 0.083% IN NEBU
5.0000 mg | INHALATION_SOLUTION | Freq: Once | RESPIRATORY_TRACT | Status: AC
  Administered 2019-04-14: 03:00:00 5 mg via RESPIRATORY_TRACT
  Filled 2019-04-14: qty 6

## 2019-04-14 MED ORDER — PANTOPRAZOLE SODIUM 40 MG PO TBEC
40.0000 mg | DELAYED_RELEASE_TABLET | Freq: Every day | ORAL | Status: DC
  Administered 2019-04-14: 40 mg via ORAL
  Filled 2019-04-14: qty 1

## 2019-04-14 MED ORDER — FUROSEMIDE 40 MG PO TABS
40.0000 mg | ORAL_TABLET | Freq: Two times a day (BID) | ORAL | Status: DC
  Administered 2019-04-14 – 2019-04-16 (×6): 40 mg via ORAL
  Filled 2019-04-14 (×6): qty 1

## 2019-04-14 MED ORDER — DOXYCYCLINE HYCLATE 100 MG PO TABS
100.0000 mg | ORAL_TABLET | Freq: Two times a day (BID) | ORAL | Status: DC
  Administered 2019-04-14 – 2019-04-16 (×5): 100 mg via ORAL
  Filled 2019-04-14 (×5): qty 1

## 2019-04-14 MED ORDER — DOXYCYCLINE HYCLATE 100 MG PO TABS
100.0000 mg | ORAL_TABLET | Freq: Once | ORAL | Status: AC
  Administered 2019-04-14: 100 mg via ORAL
  Filled 2019-04-14: qty 1

## 2019-04-14 MED ORDER — LEVOTHYROXINE SODIUM 50 MCG PO TABS
50.0000 ug | ORAL_TABLET | Freq: Every day | ORAL | Status: DC
  Administered 2019-04-14 – 2019-04-16 (×3): 50 ug via ORAL
  Filled 2019-04-14 (×3): qty 1

## 2019-04-14 MED ORDER — ALBUTEROL SULFATE (2.5 MG/3ML) 0.083% IN NEBU
INHALATION_SOLUTION | RESPIRATORY_TRACT | Status: AC
  Administered 2019-04-14: 5 mg via RESPIRATORY_TRACT
  Filled 2019-04-14: qty 3

## 2019-04-14 MED ORDER — SODIUM CHLORIDE 0.9% FLUSH
3.0000 mL | INTRAVENOUS | Status: DC | PRN
  Administered 2019-04-14 (×2): 3 mL via INTRAVENOUS
  Filled 2019-04-14 (×2): qty 3

## 2019-04-14 MED ORDER — ALBUTEROL SULFATE (2.5 MG/3ML) 0.083% IN NEBU
5.0000 mg | INHALATION_SOLUTION | Freq: Once | RESPIRATORY_TRACT | Status: AC
  Administered 2019-04-14: 01:00:00 5 mg via RESPIRATORY_TRACT
  Filled 2019-04-14: qty 6

## 2019-04-14 MED ORDER — ACETAMINOPHEN 650 MG RE SUPP: 650.0000 mg | Freq: Four times a day (QID) | RECTAL | Status: DC | PRN

## 2019-04-14 MED ORDER — SODIUM CHLORIDE 0.9 % IV SOLN: 250.0000 mL | INTRAVENOUS | Status: DC | PRN

## 2019-04-14 MED ORDER — ALBUTEROL SULFATE (2.5 MG/3ML) 0.083% IN NEBU
5.0000 mg | INHALATION_SOLUTION | Freq: Once | RESPIRATORY_TRACT | Status: AC
  Administered 2019-04-14: 5 mg via RESPIRATORY_TRACT
  Filled 2019-04-14: qty 6

## 2019-04-14 MED ORDER — HYDROCODONE-ACETAMINOPHEN 5-325 MG PO TABS
1.0000 | ORAL_TABLET | ORAL | Status: DC | PRN
  Administered 2019-04-14: 1 via ORAL
  Filled 2019-04-14: qty 1

## 2019-04-14 NOTE — Discharge Instructions (Signed)
We saw you in the ER for your breathing related complains. We gave you some breathing treatments in the ER, and seems like your symptoms have improved. Please take albuterol as needed every 4 hours. Please take the medications prescribed. Please refrain from smoking or smoke exposure. Please see a primary care doctor in 1 week. Return to the ER if your symptoms worsen.  Information on my medicine - ELIQUIS (apixaban)  This medication education was reviewed with me or my healthcare representative as part of my discharge preparation.  Why was Eliquis prescribed for you? Eliquis was prescribed for you to reduce the risk of forming blood clots that can cause a stroke if you have a medical condition called atrial fibrillation (a type of irregular heartbeat) OR to reduce the risk of a blood clots forming after orthopedic surgery.  What do You need to know about Eliquis ? Take your Eliquis TWICE DAILY - one tablet in the morning and one tablet in the evening with or without food.  It would be best to take the doses about the same time each day.  If you have difficulty swallowing the tablet whole please discuss with your pharmacist how to take the medication safely.  Take Eliquis exactly as prescribed by your doctor and DO NOT stop taking Eliquis without talking to the doctor who prescribed the medication.  Stopping may increase your risk of developing a new clot or stroke.  Refill your prescription before you run out.  After discharge, you should have regular check-up appointments with your healthcare provider that is prescribing your Eliquis.  In the future your dose may need to be changed if your kidney function or weight changes by a significant amount or as you get older.  What do you do if you miss a dose? If you miss a dose, take it as soon as you remember on the same day and resume taking twice daily.  Do not take more than one dose of ELIQUIS at the same time.  Important Safety  Information A possible side effect of Eliquis is bleeding. You should call your healthcare provider right away if you experience any of the following: ? Bleeding from an injury or your nose that does not stop. ? Unusual colored urine (red or dark brown) or unusual colored stools (red or black). ? Unusual bruising for unknown reasons. ? A serious fall or if you hit your head (even if there is no bleeding).  Some medicines may interact with Eliquis and might increase your risk of bleeding or clotting while on Eliquis. To help avoid this, consult your healthcare provider or pharmacist prior to using any new prescription or non-prescription medications, including herbals, vitamins, non-steroidal anti-inflammatory drugs (NSAIDs) and supplements.  This website has more information on Eliquis (apixaban): [email or web address].no.

## 2019-04-14 NOTE — H&P (Signed)
History and Physical    Carrie Walters WNU:[id number] DOB: [birth year] DOA: 04/13/2019  PCP: Martinique, Sarah T, MD   Patient coming from: Home   Chief Complaint: Cough, SOB, wheezing   HPI: Carrie Walters is a 64 y.o. female with medical history significant for COPD with chronic hypoxic respiratory failure, BMI 40, paroxysmal atrial fibrillation on Eliquis, hypothyroidism, history of ischemic cardiomyopathy with EF recovered to 60 to 65% in March 2020, and history of V. fib arrest now with ICD, presenting to the emergency department for evaluation of shortness of breath, cough, and wheezing.  Patient reports that she developed increased dyspnea and increased cough roughly 2 weeks ago.  She has been using her inhalers at home until she ran out.  She worsened significantly over the past 24 hours, developing acute respiratory distress yesterday evening, prompting her boyfriend to call EMS.  Patient was found tripoding and saturating 78% on her usual 3 L/min of supplemental oxygen.  She was treated with nebs and brought into the ED on nonrebreather.  Patient reports chronic unchanged leg swelling and has not noted any weight gain recently.  She denies any recent angina.  She has not noted any fevers and denies chills.  ED Course: Upon arrival to the ED, patient is found to be afebrile, saturating upper 90s on 3 L/min of supplemental oxygen, mildly tachypneic, and with blood pressure 95/55.  EKG features a sinus rhythm with nonspecific IVCD and nonspecific ST abnormality.  Chest x-ray is negative for acute cardiopulmonary disease.  Chemistry panel is notable for a glucose of 1.29, similar to priors.  CBC features a leukocytosis to 11,800.  BNP is normal.  Rapid Covid antigen test is negative and PCR test is also negative.  Patient was treated with multiple courses of nebs, systemic steroids, doxycycline, and continues to be short of breath at rest.  Review of Systems:  All other systems reviewed and apart from  HPI, are negative.  Past Medical History:  Diagnosis Date  . Anxiety   . Arthritis   . Asthma   . Atrial fibrillation ([place])   . Cellulitis   . Dysrhythmia   . Hypertension   . Hypothyroidism   . MI (myocardial infarction) [facility])     Past Surgical History:  Procedure Laterality Date  . CARDIOVERSION N/A 03/18/2018   Procedure: CARDIOVERSION;  Surgeon: Larey Dresser, MD;  Location: [facility] ENDOSCOPY;  Service: Cardiovascular;  Laterality: N/A;  . CHOLECYSTECTOMY    . ICD IMPLANT N/A 11/05/2018   Procedure: ICD IMPLANT;  Surgeon: Thompson Grayer, MD;  Location: [place] CV LAB;  Service: Cardiovascular;  Laterality: N/A;  . LEFT HEART CATH AND CORONARY ANGIOGRAPHY N/A 02/25/2018   Procedure: LEFT HEART CATH AND CORONARY ANGIOGRAPHY;  Surgeon: Lorretta Harp, MD;  Location: [place] CV LAB;  Service: Cardiovascular;  Laterality: N/A;  . TEE WITHOUT CARDIOVERSION N/A 03/18/2018   Procedure: TRANSESOPHAGEAL ECHOCARDIOGRAM (TEE);  Surgeon: Larey Dresser, MD;  Location: [facility] ENDOSCOPY;  Service: Cardiovascular;  Laterality: N/A;  . [place]       reports that she has quit smoking. She has never used smokeless tobacco. She reports that she does not drink alcohol or use drugs.  Allergies  Allergen Reactions  . Metoprolol Tartrate Shortness Of Breath  . Biaxin [Clarithromycin] Other (See Comments)    Sores in mouth  . Levaquin [Levofloxacin] Other (See Comments)    Sores in mouth  . Adhesive [Tape] Rash    Reaction to ekg pads  .  Contrast Media [Iodinated Diagnostic Agents] Rash  . Latex Itching and Dermatitis    Family History  Problem Relation Age of Onset  . Heart disease Father   . Atrial fibrillation Brother      Prior to Admission medications   Medication Sig Start Date End Date Taking? Authorizing Provider  acetaminophen (TYLENOL) 500 MG tablet Take 500-1,000 mg by mouth every 8 (eight) hours as needed for mild pain or headache.    Yes [provider]  albuterol (PROVENTIL HFA;VENTOLIN HFA) 108 (90 Base) MCG/ACT inhaler Inhale 2 puffs into the lungs every 6 (six) hours as needed for wheezing or shortness of breath.   Yes [provider]  amiodarone (PACERONE) 200 MG tablet Take 1 tablet (200 mg total) by mouth at bedtime. Patient taking differently: Take 200 mg by mouth daily at 12 noon.  09/11/18  Yes Clegg, Amy D, NP  apixaban (ELIQUIS) 5 MG TABS tablet Take 1 tablet (5 mg total) by mouth 2 (two) times daily. Restart 6/19 pm dose Patient taking differently: Take 5 mg by mouth 2 (two) times daily.  11/06/18  Yes Seiler, Amber K, NP  bisoprolol (ZEBETA) 5 MG tablet Take 1 tablet (5 mg total) by mouth daily. 07/26/18  Yes Clegg, Amy D, NP  cephALEXin (KEFLEX) 500 MG capsule Take 500 mg by mouth daily.   Yes [provider]  doxycycline (VIBRAMYCIN) 100 MG capsule Take 100 mg by mouth daily.   Yes [provider]  fluticasone (FLONASE) 50 MCG/ACT nasal spray Place 1 spray into both nostrils daily as needed for allergies or rhinitis.   Yes [provider]  furosemide (LASIX) 40 MG tablet Take 1 tablet (40 mg total) by mouth 2 (two) times daily. TAKE ADDITIONAL 1/2 TAB FOR EDEMA OR DYSPNEA Patient taking differently: Take 40 mg by mouth 2 (two) times daily.  10/07/18  Yes Clegg, Amy D, NP  KLOR-CON M20 20 MEQ tablet TAKE 1 TABLET BY MOUTH EVERY DAY Patient taking differently: Take 20 mEq by mouth daily.  10/17/18  Yes [name], MD  levothyroxine (SYNTHROID, LEVOTHROID) 50 MCG tablet Take 1 tablet (50 mcg total) by mouth daily before breakfast. 07/27/18  Yes Clegg, Amy D, NP  pantoprazole (PROTONIX) 40 MG tablet Take 40 mg by mouth daily at 12 noon.   Yes [provider]  spironolactone (ALDACTONE) 25 MG tablet Take 1 tablet (25 mg total) by mouth daily. 09/24/18  Yes Clegg, Amy D, NP    Physical Exam: Vitals:   04/14/19 0130 04/14/19 0300 04/14/19 0400 04/14/19 0437  BP:    96/62  Pulse:  76 76 75 83  Resp: (!) 22 17 20  (!) 24  Temp:      TempSrc:      SpO2: 97% 99% 96% 97%  Weight:      Height:        Constitutional: NAD, calm  Eyes: PERTLA, lids and conjunctivae normal ENMT: Mucous membranes are moist. Posterior pharynx clear of any exudate or lesions.   Neck: normal, supple, no masses, no thyromegaly Respiratory: Diffuse wheezes, frequent cough, mild tachypnea. Speaking full sentences. No accessory muscle use.  Cardiovascular: S1 & S2 heard, regular rate and rhythm. Leg edema bilaterally, Lt > Rt.   Abdomen: No distension, no tenderness, soft. Bowel sounds normal.  Musculoskeletal: no clubbing / cyanosis. No joint deformity upper and lower extremities.  Skin: Faint erythema involving lower legs bilaterally in gaiter distribution. Skin otherwise warm, dry, well-perfused. Neurologic: No facial  asymmetry. Sensation intact. Moving all extremities.  Psychiatric: Alert and oriented to person, place, and situation. Very pleasant, cooperative.     Labs on Admission: I have personally reviewed following labs and imaging studies  CBC: Recent Labs  Lab 04/13/19 2103  WBC 11.8*  NEUTROABS 8.4*  HGB 14.3  HCT 44.0  MCV 91.3  PLT 283   Basic Metabolic Panel: Recent Labs  Lab 04/14/19 0023  NA 136  K 4.6  CL 98  CO2 28  GLUCOSE 166*  BUN 11  CREATININE 1.29*  CALCIUM 9.6   GFR: Estimated Creatinine Clearance: 57.1 mL/min (A) (by C-G formula based on SCr of 1.29 mg/dL (H)). Liver Function Tests: No results for input(s): AST, ALT, ALKPHOS, BILITOT, PROT, ALBUMIN in the last 168 hours. No results for input(s): LIPASE, AMYLASE in the last 168 hours. No results for input(s): AMMONIA in the last 168 hours. Coagulation Profile: No results for input(s): INR, PROTIME in the last 168 hours. Cardiac Enzymes: No results for input(s): CKTOTAL, CKMB, CKMBINDEX, TROPONINI in the last 168 hours. BNP (last 3 results) No results for input(s): PROBNP in the last 8760  hours. HbA1C: No results for input(s): HGBA1C in the last 72 hours. CBG: No results for input(s): GLUCAP in the last 168 hours. Lipid Profile: No results for input(s): CHOL, HDL, LDLCALC, TRIG, CHOLHDL, LDLDIRECT in the last 72 hours. Thyroid Function Tests: No results for input(s): TSH, T4TOTAL, FREET4, T3FREE, THYROIDAB in the last 72 hours. Anemia Panel: No results for input(s): VITAMINB12, FOLATE, FERRITIN, TIBC, IRON, RETICCTPCT in the last 72 hours. Urine analysis: No results found for: COLORURINE, APPEARANCEUR, LABSPEC, PHURINE, GLUCOSEU, HGBUR, BILIRUBINUR, KETONESUR, PROTEINUR, UROBILINOGEN, NITRITE, LEUKOCYTESUR Sepsis Labs: @LABRCNTIP (procalcitonin:4,lacticidven:4) ) Recent Results (from the past 240 hour(s))  SARS CORONAVIRUS 2 (TAT 6-24 HRS) Nasopharyngeal Nasopharyngeal Swab     Status: None   Collection Time: 04/13/19 11:25 PM   Specimen: Nasopharyngeal Swab  Result Value Ref Range Status   SARS Coronavirus 2 NEGATIVE NEGATIVE Final    Comment: (NOTE) SARS-CoV-2 target nucleic acids are NOT DETECTED. The SARS-CoV-2 RNA is generally detectable in upper and lower respiratory specimens during the acute phase of infection. Negative results do not preclude SARS-CoV-2 infection, do not rule out co-infections with other pathogens, and should not be used as the sole basis for treatment or other patient management decisions. Negative results must be combined with clinical observations, patient history, and epidemiological information. The expected result is Negative. Fact Sheet for Patients: 04/15/19 Fact Sheet for Healthcare Providers: HairSlick.no This test is not yet approved or cleared by the quierodirigir.com FDA and  has been authorized for detection and/or diagnosis of SARS-CoV-2 by FDA under an Emergency Use Authorization (EUA). This EUA will remain  in effect (meaning this test can be used) for the  duration of the COVID-19 declaration under Section 56 4(b)(1) of the Act, 21 U.S.C. section 360bbb-3(b)(1), unless the authorization is terminated or revoked sooner. Performed at [facility], [address], [place], [place]      Radiological Exams on Admission: Dg Chest Port 1 View  Result Date: 04/13/2019 CLINICAL DATA:  Pt transported from home by EMS, pt transported for [facility], pt unable to tolerate NRB. Pt 78 % on 3L, pt is on home 02. Cough noted x 2 weeks, productive. Pt reports she has been out of her inhaler, 18 IV est by EMS. EXAM: PORTABLE CHEST 1 VIEW COMPARISON:  11/27/2018 FINDINGS: Cardiac silhouette is normal in size. No mediastinal or  hilar masses. There are streaky opacities at the lung bases consistent with scarring or atelectasis, similar to the prior study. Remainder of the lungs is clear. No pleural effusion or pneumothorax. Left anterior chest wall pacemaker is stable. Skeletal structures are grossly intact. IMPRESSION: No acute cardiopulmonary disease. Electronically Signed   By: Amie Portlandavid  Ormond M.D.   On: 04/13/2019 21:44    EKG: Independently reviewed. Sinus rhythm, non-specific IVCD, non-specific repolarization abnormality.   Assessment/Plan   1. COPD exacerbation; chronic hypoxic respiratory failure  - Presents with 2 weeks of increased SOB, cough, and wheezing, worsened acutely 11/22 after running out of inhaler, found tripoding with sat 78% on her usual 3 Lpm by EMS  - CXR is clear, she is afebrile, and COVID-19 is negative  - She was given multiple neb treatments, doxycycline, Decadron, and reports feeling much better in ED but remains dyspneic at rest  - Check sputum culture and continue nebs, systemic steroid, doxycycline, and supplemental O2    2. Chronic diastolic CHF  - Appears compensated  - EF was 60-65% in March 2020  - Continue diuretics, beta-blocker    3. Paroxysmal atrial fibrillation  - In sinus rhythm on admission  -  CHADS-VASc is at least 2 (gender, CHF) - Continue amiodarone and Eliquis    4. Hypothyroidism  - Continue Synthroid     5. CKD IIIa  - SCr is 1.29 on admission, similar to priors   - Renally-dose medications    6. Asymmetric leg swelling and redness  - Left leg is more swollen than right, patient believes this is chronic  - There is faint erythema bilaterally without warmth, fluctuance, or drainage; bilateral gaiter distribution suggests stasis dermatitis over cellulitis   - Venous US ordered by ED physician to rule-out DVT    PPE: Mask, face shield  DVT prophylaxis: Eliquis  Code Status: Full  Family Communication: Discussed with patient  Consults called: None  Admission status: Observation     Briscoe Deutscherimothy S Terryl Niziolek, MD [facility] Pager [phone]  If 7PM-7AM, please contact night-coverage [email or web address] Password [email or web address]  04/14/2019, 4:55 AM

## 2019-04-14 NOTE — Progress Notes (Signed)
Lower extremity venous has been completed.   Preliminary results in CV Proc.   Abram Sander 04/14/2019 9:24 AM

## 2019-04-14 NOTE — Progress Notes (Signed)
Admitted to OBS after midnight. Care began before midnight. Hx chronic resp failure on home oxygen 2L, copd, afib, NICM, s/p ICD, called ems due to persistent worsening sob, cough. Ems found patient tripoding and sats 78% on 3L.   Treated in ED with nebs, solumedrol, doxy. She improved and plan was to DC but then wheezes increased inspite of improved aeration. Provided with more nebs. No improvement after 3rd neb. Demonstrated DOE getting up to [facility] and repositioning self in bed.   Admitted for acute on chronic respiratory failure due to copd exacerbation.   A/P 1. COPD exacerbation;  Acute on chronic hypoxic respiratory failure BS distant with fair air movement. +wheeze. Mild increased work of breathing with conversation.  -continue nebs and solumedrol -doxy  - Check sputum culture -monitor oxygen saturation level    2. Chronic diastolic CHF  Appears compensated. EF was 60-65% in March 2020  - Continue diuretics, beta-blocker   -daily weight -intake and output  3. Paroxysmal atrial fibrillation  In sinus rhythm on admission. CHADS-VASc is at least 2 (gender, CHF) - Continue amiodarone and Eliquis    4. Hypothyroidism  - Continue Synthroid     5. CKD IIIa  SCr is 1.29 on admission, similar to priors   - Renally-dose medications    6. Asymmetric leg swelling and redness  Left leg is more swollen than right, patient believes this is chronic  There is faint erythema bilaterally without warmth, fluctuance, or drainage; bilateral gaiter distribution suggests stasis dermatitis over cellulitis.  Venous US reveal no evidence DVT either side.    Santiago Glad m. Krystol Rocco, NP

## 2019-04-14 NOTE — Progress Notes (Signed)
Peak flow performed as ordered, three attempts where performed best effort of 190.

## 2019-04-14 NOTE — Evaluation (Signed)
Physical Therapy Evaluation Patient Details Name: Earlee Herald MRN: [id number] DOB: March 07, 1956 Today's Date: 04/14/2019   History of Present Illness  63yo female c/o SOB, cough, wheezing and found to be on 78% SpO2 even on her usual 3LPM O2 by EMS. Admitted for COPD exacerbation. PMH MI s/p ICD placement, HTN, A-fib, anxiety  Clinical Impression   Patient received in bed, pleasant and willing to participate in session today. Able to complete functional bed mobility and transfers with Mod(I)/no device, also ambulated 13f in room with no device and S on 3LPM with VSS and SpO2 96-97% on supplemental oxygen. She was left up in the chair with all needs met this afternoon, RT present and attending. Do not feel she is in need of follow-up skilled PT services once medically cleared for DC from this facility.     Follow Up Recommendations No PT follow up    Equipment Recommendations  None recommended by PT    Recommendations for Other Services       Precautions / Restrictions Precautions Precautions: Other (comment) Precaution Comments: watch HR/O2 Restrictions Weight Bearing Restrictions: No      Mobility  Bed Mobility Overal bed mobility: Modified Independent                Transfers Overall transfer level: Modified independent Equipment used: None                Ambulation/Gait Ambulation/Gait assistance: Supervision Gait Distance (Feet): 25 Feet Assistive device: None Gait Pattern/deviations: Step-through pattern;Narrow base of support Gait velocity: WNL   General Gait Details: narrow BOS, otherwise gait WNL with no significnat unsteadiness or LOB; SPO2 96-97% on 3LPM with activity, HR up to 91BPM  Stairs            Wheelchair Mobility    Modified Rankin (Stroke Patients Only)       Balance Overall balance assessment: No apparent balance deficits (not formally assessed)                                           Pertinent  Vitals/Pain Pain Assessment: 0-10 Pain Score: 4  Pain Location: eyes and nose area Pain Descriptors / Indicators: Aching;Pressure Pain Intervention(s): Limited activity within patient's tolerance;Monitored during session    Home Living Family/patient expects to be discharged to:: Private residence Living Arrangements: Children;Non-relatives/Friends(daughter and boyfriend) Available Help at Discharge: Family;Available 24 hours/day Type of Home: Mobile home Home Access: Stairs to enter Entrance Stairs-Rails: Right Entrance Stairs-Number of Steps: 3-4 Home Layout: One level Home Equipment: Bedside commode;Cane - single point;Walker - 4 wheels Additional Comments: uses cane sometimes, uses walker once in awhile    Prior Function Level of Independence: Independent with assistive device(s)         Comments: cane at times     Hand Dominance        Extremity/Trunk Assessment   Upper Extremity Assessment Upper Extremity Assessment: Overall WFL for tasks assessed    Lower Extremity Assessment Lower Extremity Assessment: Overall WFL for tasks assessed    Cervical / Trunk Assessment Cervical / Trunk Assessment: Normal  Communication   Communication: No difficulties  Cognition Arousal/Alertness: Awake/alert Behavior During Therapy: WFL for tasks assessed/performed Overall Cognitive Status: Within Functional Limits for tasks assessed  General Comments      Exercises     Assessment/Plan    PT Assessment Patient needs continued PT services  PT Problem List Obesity;Decreased activity tolerance;Cardiopulmonary status limiting activity       PT Treatment Interventions DME instruction;Balance training;Gait training;Neuromuscular re-education;Stair training;Functional mobility training;Patient/family education;Therapeutic activities;Therapeutic exercise    PT Goals (Current goals can be found in the Care Plan section)   Acute Rehab PT Goals Patient Stated Goal: maybe go home tomorrow PT Goal Formulation: With patient Time For Goal Achievement: 04/28/19 Potential to Achieve Goals: Good    Frequency Min 3X/week   Barriers to discharge        Co-evaluation               AM-PAC PT "6 Clicks" Mobility  Outcome Measure Help needed turning from your back to your side while in a flat bed without using bedrails?: None Help needed moving from lying on your back to sitting on the side of a flat bed without using bedrails?: None Help needed moving to and from a bed to a chair (including a wheelchair)?: None Help needed standing up from a chair using your arms (e.g., wheelchair or bedside chair)?: None Help needed to walk in hospital room?: A Little Help needed climbing 3-5 steps with a railing? : A Little 6 Click Score: 22    End of Session Equipment Utilized During Treatment: Oxygen Activity Tolerance: Patient tolerated treatment well Patient left: in chair;with call bell/phone within reach;Other (comment)(RT present and attending)   PT Visit Diagnosis: Muscle weakness (generalized) (M62.81)    Time: 1445-1510 PT Time Calculation (min) (ACUTE ONLY): 25 min   Charges:   PT Evaluation $PT Eval Low Complexity: 1 Low PT Treatments $Gait Training: 8-22 mins       Windell Norfolk, DPT, PN1   Supplemental Physical Therapist [place]    Pager [phone] Acute Rehab Office [phone]

## 2019-04-14 NOTE — ED Provider Notes (Signed)
[facility] EMERGENCY DEPARTMENT Provider Note   CSN: [id number] Arrival date & time: 04/13/19  2045     History   Chief Complaint Chief Complaint  Patient presents with  . Shortness of Breath    HPI Carrie Walters is a 63 y.o. female.     HPI 63 year old female comes in a chief complaint of shortness of breath.  She has history of A. Fib, severe asthma-COPD on 3 L of oxygen, CAD. She reports having worsening shortness of breath and cough over the past 2 weeks.  Her cough is producing thick phlegm.  She started having worsening of her shortness of breath earlier today prompting her to call EMS.  EMS reports that patient was tripoding and her O2 sats were at 78% on 3 L.  They gave her nonrebreather and nebulizer treatment which helped her.   Patient denies any sick exposures. She has no history of DVT, PE.  She has history of CHF but reports that there has been no new leg swelling or weight gain.  Patient is compliant with her medications but reports running out of her inhaler.  Past Medical History:  Diagnosis Date  . Anxiety   . Arthritis   . Asthma   . Atrial fibrillation (HCC)   . Cellulitis   . Dysrhythmia   . Hypertension   . Hypothyroidism   . MI (myocardial infarction) [facility])     Patient Active Problem List   Diagnosis Date Noted  . ICD (implantable cardioverter-defibrillator) pocket hematoma 11/12/2018  . Hx of ventricular fibrillation 11/04/2018  . Acute on chronic systolic (congestive) heart failure (HCC) 07/23/2018  . CHF (congestive heart failure) (HCC) 04/02/2018  . Acute on chronic combined systolic and diastolic CHF (congestive heart failure) (HCC) 03/14/2018  . Diarrhea 03/14/2018  . Left upper quadrant pain 03/14/2018  . Cardiac arrest (HCC)   . Acute systolic CHF (congestive heart failure) (HCC)   . Pressure injury of skin 02/22/2018  . A-fib (HCC) 02/21/2018  . Atrial flutter (HCC) 12/27/2017  . Morbid obesity (HCC) 12/27/2017   . Hypothyroidism 12/21/2017    Past Surgical History:  Procedure Laterality Date  . CARDIOVERSION N/A 03/18/2018   Procedure: CARDIOVERSION;  Surgeon: [name], MD;  Location: [facility] ENDOSCOPY;  Service: Cardiovascular;  Laterality: N/A;  . CHOLECYSTECTOMY    . ICD IMPLANT N/A 11/05/2018   Procedure: ICD IMPLANT;  Surgeon: Hillis Range, MD;  Location: [facility] INVASIVE CV LAB;  Service: Cardiovascular;  Laterality: N/A;  . LEFT HEART CATH AND CORONARY ANGIOGRAPHY N/A 02/25/2018   Procedure: LEFT HEART CATH AND CORONARY ANGIOGRAPHY;  Surgeon: [name], MD;  Location: [facility] INVASIVE CV LAB;  Service: Cardiovascular;  Laterality: N/A;  . TEE WITHOUT CARDIOVERSION N/A 03/18/2018   Procedure: TRANSESOPHAGEAL ECHOCARDIOGRAM (TEE);  Surgeon: [name], MD;  Location: [facility] ENDOSCOPY;  Service: Cardiovascular;  Laterality: N/A;  . UMBILICAL HERNIA REPAIR       OB History   No obstetric history on file.      Home Medications    Prior to Admission medications   Medication Sig Start Date End Date Taking? Authorizing Provider  acetaminophen (TYLENOL) 500 MG tablet Take 500-1,000 mg by mouth every 8 (eight) hours as needed for mild pain or headache.    Yes [provider]  albuterol (PROVENTIL HFA;VENTOLIN HFA) 108 (90 Base) MCG/ACT inhaler Inhale 2 puffs into the lungs every 6 (six) hours as needed for wheezing or shortness of breath.   Yes [provider]  amiodarone (PACERONE) 200 MG tablet Take 1 tablet (200 mg total) by mouth at bedtime. Patient taking differently: Take 200 mg by mouth daily at 12 noon.  09/11/18  Yes Clegg, Amy D, NP  apixaban (ELIQUIS) 5 MG TABS tablet Take 1 tablet (5 mg total) by mouth 2 (two) times daily. Restart 6/19 pm dose Patient taking differently: Take 5 mg by mouth 2 (two) times daily.  11/06/18  Yes Seiler, Amber K, NP  bisoprolol (ZEBETA) 5 MG tablet Take 1 tablet (5 mg total) by mouth daily. 07/26/18  Yes Clegg, Amy D, NP  cephALEXin  (KEFLEX) 500 MG capsule Take 500 mg by mouth daily.   Yes [provider]  doxycycline (VIBRAMYCIN) 100 MG capsule Take 100 mg by mouth daily.   Yes [provider]  fluticasone (FLONASE) 50 MCG/ACT nasal spray Place 1 spray into both nostrils daily as needed for allergies or rhinitis.   Yes [provider]  furosemide (LASIX) 40 MG tablet Take 1 tablet (40 mg total) by mouth 2 (two) times daily. TAKE ADDITIONAL 1/2 TAB FOR EDEMA OR DYSPNEA Patient taking differently: Take 40 mg by mouth 2 (two) times daily.  10/07/18  Yes Clegg, Amy D, NP  KLOR-CON M20 20 MEQ tablet TAKE 1 TABLET BY MOUTH EVERY DAY Patient taking differently: Take 20 mEq by mouth daily.  10/17/18  Yes [name], MD  levothyroxine (SYNTHROID, LEVOTHROID) 50 MCG tablet Take 1 tablet (50 mcg total) by mouth daily before breakfast. 07/27/18  Yes Clegg, Amy D, NP  pantoprazole (PROTONIX) 40 MG tablet Take 40 mg by mouth daily at 12 noon.   Yes [provider]  spironolactone (ALDACTONE) 25 MG tablet Take 1 tablet (25 mg total) by mouth daily. 09/24/18  Yes Clegg, Amy D, NP    Family History Family History  Problem Relation Age of Onset  . Heart disease Father   . Atrial fibrillation Brother     Social History Social History   Tobacco Use  . Smoking status: Former Games developer  . Smokeless tobacco: Never Used  Substance Use Topics  . Alcohol use: Never    Frequency: Never  . Drug use: Never     Allergies   Metoprolol tartrate, Biaxin [clarithromycin], Levaquin [levofloxacin], Adhesive [tape], Contrast media [iodinated diagnostic agents], and Latex   Review of Systems Review of Systems  Unable to perform ROS: Acuity of condition  Constitutional: Positive for activity change.  Respiratory: Positive for shortness of breath.      Physical Exam Updated Vital Signs BP 115/64   Pulse 76   Temp 98.7 F (37.1 C) (Oral)   Resp (!) 21   Ht 5\' 6"  (1.676 m)   Wt 113.6 kg   SpO2 100%    BMI 40.42 kg/m   Physical Exam Vitals signs and nursing note reviewed.  Constitutional:      Appearance: She is well-developed.  HENT:     Head: Normocephalic and atraumatic.  Eyes:     Pupils: Pupils are equal, round, and reactive to light.  Neck:     Musculoskeletal: Neck supple.  Cardiovascular:     Rate and Rhythm: Normal rate and regular rhythm.     Heart sounds: Normal heart sounds. No murmur.  Pulmonary:     Effort: Pulmonary effort is normal. No respiratory distress.     Breath sounds: Wheezing and rhonchi present. No rales.  Abdominal:     General: There is no distension.     Palpations:  Abdomen is soft.     Tenderness: There is no abdominal tenderness. There is no guarding or rebound.  Musculoskeletal:     Right lower leg: She exhibits no tenderness. No edema.     Left lower leg: She exhibits no tenderness. Edema present.  Skin:    General: Skin is warm and dry.  Neurological:     Mental Status: She is alert and oriented to person, place, and time.      ED Treatments / Results  Labs (all labs ordered are listed, but only abnormal results are displayed) Labs Reviewed  CBC WITH DIFFERENTIAL/PLATELET - Abnormal; Notable for the following components:      Result Value   WBC 11.8 (*)    Neutro Abs 8.4 (*)    All other components within normal limits  SARS CORONAVIRUS 2 (TAT 6-24 HRS)  BASIC METABOLIC PANEL  BRAIN NATRIURETIC PEPTIDE  POC SARS CORONAVIRUS 2 AG -  ED  POC SARS CORONAVIRUS 2 AG -  ED    EKG EKG Interpretation  Date/Time:  Sunday April 13 2019 20:52:35 EST Ventricular Rate:  89 PR Interval:    QRS Duration: 118 QT Interval:  417 QTC Calculation: 508 R Axis:   52 Text Interpretation: Sinus rhythm Borderline prolonged PR interval Consider left atrial enlargement Nonspecific intraventricular conduction delay Probable anterior infarct, old Borderline repolarization abnormality st depression in inferior leads and lateal leads is new mild  St elevation in anterior leads Confirmed by Varney Biles [phone]) on 04/13/2019 9:12:22 PM   Radiology Dg Chest Port 1 View  Result Date: 04/13/2019 CLINICAL DATA:  Pt transported from home by EMS, pt transported for [facility], pt unable to tolerate NRB. Pt 78 % on 3L, pt is on home 02. Cough noted x 2 weeks, productive. Pt reports she has been out of her inhaler, 18 IV est by EMS. EXAM: PORTABLE CHEST 1 VIEW COMPARISON:  11/27/2018 FINDINGS: Cardiac silhouette is normal in size. No mediastinal or hilar masses. There are streaky opacities at the lung bases consistent with scarring or atelectasis, similar to the prior study. Remainder of the lungs is clear. No pleural effusion or pneumothorax. Left anterior chest wall pacemaker is stable. Skeletal structures are grossly intact. IMPRESSION: No acute cardiopulmonary disease. Electronically Signed   By: Lajean Manes M.D.   On: 04/13/2019 21:44    Procedures .Critical Care Performed by: Varney Biles, MD Authorized by: Varney Biles, MD   Critical care provider statement:    Critical care time (minutes):  48   Critical care was necessary to treat or prevent imminent or life-threatening deterioration of the following conditions:  Respiratory failure   Critical care was time spent personally by me on the following activities:  Discussions with consultants, evaluation of patient's response to treatment, examination of patient, ordering and performing treatments and interventions, ordering and review of laboratory studies, ordering and review of radiographic studies, pulse oximetry, re-evaluation of patient's condition, obtaining history from patient or surrogate and review of old charts   (including critical care time)  Medications Ordered in ED Medications  albuterol (VENTOLIN HFA) 108 (90 Base) MCG/ACT inhaler 6 puff (has no administration in time range)  AeroChamber Plus Flo-Vu Large MISC (has no administration in time range)  albuterol  (PROVENTIL) (2.5 MG/3ML) 0.083% nebulizer solution 5 mg (has no administration in time range)  ipratropium (ATROVENT) nebulizer solution 0.5 mg (has no administration in time range)  ipratropium (ATROVENT HFA) inhaler 2 puff (2 puffs Inhalation Given 04/13/19 2137)  dexamethasone (  DECADRON) injection 10 mg (10 mg Intravenous Given 04/13/19 2134)     Initial Impression / Assessment and Plan / ED Course  I have reviewed the triage vital signs and the nursing notes.  Pertinent labs & imaging results that were available during my care of the patient were reviewed by me and considered in my medical decision making (see chart for details).  Clinical Course as of Apr 14 27  Mon Apr 14, 2019  [id number] Patient reassessed.  She is breathing a lot comfortably now.  She still having some wheezing and is awaiting another round of inhaler treatment.  Dr. Elesa MassedWard will assume the care at this time.  Anticipate discharge if patient continues to improve on the respiratory side.  Given her advanced COPD if she has any respiratory distress and she will benefit with admission to the hospital.  Labs pending but rapid Covid test is negative.   [AN]    Clinical Course User Index [AN] Derwood KaplanNanavati, Rigby Leonhardt, MD       [age] year old female with advanced COPD on 3 L of oxygen comes into the ER with chief complaint of shortness of breath.  She is having cough for the last 2 weeks that is productive.  She is also having diffuse wheezing on our exam and EMS reports that patient was hypoxic when they first assessed her despite her being on 3 L of oxygen.  She is tachypneic, mild accessory muscle use noted at arrival. She is having diffuse wheezing.  She denies any COVID-19 exposures.  Nebulizer treatment ordered along with basic labs.  Leeroy BockBeverly Stanislaw was evaluated in Emergency Department on 04/14/2019 for the symptoms described in the history of present illness. She was evaluated in the context of the global COVID-19 pandemic, which  necessitated consideration that the patient might be at risk for infection with the SARS-CoV-2 virus that causes COVID-19. Institutional protocols and algorithms that pertain to the evaluation of patients at risk for COVID-19 are in a state of rapid change based on information released by regulatory bodies including the CDC and federal and state organizations. These policies and algorithms were followed during the patient's care in the ED.   Final Clinical Impressions(s) / ED Diagnoses   Final diagnoses:  COPD exacerbation (HCC)  Chronic respiratory failure with hypoxia [facility](HCC)    ED Discharge Orders    None       Derwood KaplanNanavati, Aldwin Micalizzi, MD 04/14/19 [phone]

## 2019-04-14 NOTE — ED Notes (Signed)
RT called for peak flow check and nebs

## 2019-04-14 NOTE — Progress Notes (Signed)
Pt ambulated with PT today, o2 sat stayed around 96-97% on her 3L o2. Pt ambulates in room fine with RN, no extraneous effort with respiration. Will continue to monitor. [place], RN 04/14/2019 3:55 PM

## 2019-04-14 NOTE — Plan of Care (Signed)
  Problem: Education: Goal: Knowledge of General Education information will improve Description: Including pain rating scale, medication(s)/side effects and non-pharmacologic comfort measures Outcome: Progressing   Problem: Health Behavior/Discharge Planning: Goal: Ability to manage health-related needs will improve Outcome: Progressing   Problem: Activity: Goal: Risk for activity intolerance will decrease Outcome: Progressing   

## 2019-04-15 DIAGNOSIS — J9621 Acute and chronic respiratory failure with hypoxia: Secondary | ICD-10-CM

## 2019-04-15 LAB — GLUCOSE, CAPILLARY: Glucose-Capillary: 178 mg/dL — ABNORMAL HIGH (ref 70–99)

## 2019-04-15 LAB — PROCALCITONIN: Procalcitonin: <0.1 ng/mL

## 2019-04-15 MED ORDER — PANTOPRAZOLE SODIUM 40 MG PO TBEC
40.0000 mg | DELAYED_RELEASE_TABLET | Freq: Every day | ORAL | Status: DC
  Administered 2019-04-15 – 2019-04-16 (×2): 40 mg via ORAL
  Filled 2019-04-15 (×2): qty 1

## 2019-04-15 MED ORDER — METHYLPREDNISOLONE SODIUM SUCC 125 MG IJ SOLR
40.0000 mg | Freq: Every day | INTRAMUSCULAR | Status: DC
  Administered 2019-04-16: 40 mg via INTRAVENOUS
  Filled 2019-04-15: qty 2

## 2019-04-15 MED ORDER — BUDESONIDE 0.5 MG/2ML IN SUSP
0.5000 mg | Freq: Two times a day (BID) | RESPIRATORY_TRACT | Status: DC
  Administered 2019-04-15 – 2019-04-16 (×3): 0.5 mg via RESPIRATORY_TRACT
  Filled 2019-04-15 (×4): qty 2

## 2019-04-15 NOTE — Plan of Care (Signed)
  Problem: Education: Goal: Knowledge of General Education information will improve Description: Including pain rating scale, medication(s)/side effects and non-pharmacologic comfort measures Outcome: Progressing   Problem: Clinical Measurements: Goal: Respiratory complications will improve Outcome: Progressing   Problem: Activity: Goal: Risk for activity intolerance will decrease Outcome: Progressing   Problem: Pain Managment: Goal: General experience of comfort will improve Outcome: Progressing   Problem: Safety: Goal: Ability to remain free from injury will improve Outcome: Progressing   

## 2019-04-15 NOTE — Progress Notes (Signed)
PROGRESS NOTE    Carrie Walters  DGL:[id number] DOB: 1955/12/06 DOA: 04/13/2019 PCP: [place], Sarah T, MD   Brief Narrative:  63 year old with history of COPD, chronic hypoxia on 3 L nasal cannula, paroxysmal A. fib on Eliquis, hypothyroidism, ischemic cardiomyopathy, V. fib arrest status post ICD presented to the hospital with shortness of breath and hypoxia.  COVID-19 was negative.  She was diagnosed with COPD exacerbation.   Assessment & Plan:   Principal Problem:   Acute-on-chronic respiratory failure (HCC) Active Problems:   Hypothyroidism   AF (paroxysmal atrial fibrillation) (HCC)   Chronic diastolic CHF (congestive heart failure) (HCC)   COPD with acute exacerbation (HCC)   CKD (chronic kidney disease), stage III   Acute on chronic respiratory failure (HCC)  Acute on chronic hypoxic respiratory failure, 3 L nasal cannula, acute moderate COPD exacerbation -Wheezing improved, transition to daily Solu-Medrol -Bronchodilators, Pulmicort -5 days of oral doxycycline -Supplemental oxygen.  Incentive spirometer.  Chronic diastolic congestive heart failure, ejection fraction 60 to 65% Paroxysmal atrial fibrillation on Eliquis -Continue home medications.  Continue Eliquis -Currently appears to be euvolemic.  Supportive care.  Bilateral lower extremity asymmetric swelling left greater than right -Possible chronic skin changes versus cellulitis.  Already on doxycycline -Ultrasound Dopplers-negative for DVT -Recommend to elevate her legs.  She is already on Eliquis.  Hypothyroidism -Synthroid  CKD stage IIIa -Creatinine around baseline of 1.2.    DVT prophylaxis: Eliquis Code Status: Full code Family Communication: None Disposition Plan: Maintain hospital stay until her breathing is close to her baseline.  She still dyspneic with minimal exertion.  She has failed outpatient treatment.  Consultants:   None  Procedures:   None  Antimicrobials:   Doxycycline day  2   Subjective: Sitting up in the chair, tells me she does not feel up to par yet but better than yesterday.  Some dyspnea on exertion.  Review of Systems Otherwise negative except as per HPI, including: General: Denies fever, chills, night sweats or unintended weight loss. Resp: Denies wheezing Cardiac: Denies chest pain, palpitations, orthopnea, paroxysmal nocturnal dyspnea. GI: Denies abdominal pain, nausea, vomiting, diarrhea or constipation GU: Denies dysuria, frequency, hesitancy or incontinence MS: Denies muscle aches, joint pain or swelling Neuro: Denies headache, neurologic deficits (focal weakness, numbness, tingling), abnormal gait Psych: Denies anxiety, depression, SI/HI/AVH Skin: Denies new rashes or lesions ID: Denies sick contacts, exotic exposures, travel  Objective: Vitals:   04/14/19 1956 04/15/19 0300 04/15/19 0815 04/15/19 0855  BP:  (!) 102/50 (!) 98/59 103/60  Pulse:  76 67 63  Resp:  18 18   Temp:  (!) 97.5 F (36.4 C) 98.1 F (36.7 C) 98 F (36.7 C)  TempSrc:  Oral Oral Oral  SpO2: 95% 95% 95% 97%  Weight:      Height:        Intake/Output Summary (Last 24 hours) at 04/15/2019 1013 Last data filed at 04/15/2019 0945 Gross per 24 hour  Intake 1360 ml  Output --  Net 1360 ml   Filed Weights   04/13/19 2117  Weight: 113.6 kg    Examination:  General exam: Appears calm and comfortable, 3 L nasal cannula Respiratory system: Diminished bilateral breath sounds but no wheezing Cardiovascular system: S1 & S2 heard, RRR. No JVD, murmurs, rubs, gallops or clicks.  1+ bilateral lower extremity swelling Gastrointestinal system: Abdomen is nondistended, soft and nontender. No organomegaly or masses felt. Normal bowel sounds heard. Central nervous system: Alert and oriented. No focal neurological deficits. Extremities: Symmetric 5  x 5 power. Skin: No rashes, lesions or ulcers Psychiatry: Judgement and insight appear normal. Mood & affect appropriate.    Data Reviewed:   CBC: Recent Labs  Lab 04/13/19 2103 04/14/19 0707  WBC 11.8* 6.9  NEUTROABS 8.4* 6.3  HGB 14.3 13.8  HCT 44.0 42.9  MCV 91.3 90.9  PLT 283 300   Basic Metabolic Panel: Recent Labs  Lab 04/14/19 0023 04/14/19 0707  NA 136 137  K 4.6 3.8  CL 98 98  CO2 28 24  GLUCOSE 166* 177*  BUN 11 10  CREATININE 1.29* 1.44*  CALCIUM 9.6 10.0   GFR: Estimated Creatinine Clearance: 51.1 mL/min (A) (by C-G formula based on SCr of 1.44 mg/dL (H)). Liver Function Tests: No results for input(s): AST, ALT, ALKPHOS, BILITOT, PROT, ALBUMIN in the last 168 hours. No results for input(s): LIPASE, AMYLASE in the last 168 hours. No results for input(s): AMMONIA in the last 168 hours. Coagulation Profile: No results for input(s): INR, PROTIME in the last 168 hours. Cardiac Enzymes: No results for input(s): CKTOTAL, CKMB, CKMBINDEX, TROPONINI in the last 168 hours. BNP (last 3 results) No results for input(s): PROBNP in the last 8760 hours. HbA1C: No results for input(s): HGBA1C in the last 72 hours. CBG: Recent Labs  Lab 04/14/19 0643 04/15/19 0809  GLUCAP 177* 178*   Lipid Profile: No results for input(s): CHOL, HDL, LDLCALC, TRIG, CHOLHDL, LDLDIRECT in the last 72 hours. Thyroid Function Tests: No results for input(s): TSH, T4TOTAL, FREET4, T3FREE, THYROIDAB in the last 72 hours. Anemia Panel: No results for input(s): VITAMINB12, FOLATE, FERRITIN, TIBC, IRON, RETICCTPCT in the last 72 hours. Sepsis Labs: No results for input(s): PROCALCITON, LATICACIDVEN in the last 168 hours.  Recent Results (from the past 240 hour(s))  SARS CORONAVIRUS 2 (TAT 6-24 HRS) Nasopharyngeal Nasopharyngeal Swab     Status: None   Collection Time: 04/13/19 11:25 PM   Specimen: Nasopharyngeal Swab  Result Value Ref Range Status   SARS Coronavirus 2 NEGATIVE NEGATIVE Final    Comment: (NOTE) SARS-CoV-2 target nucleic acids are NOT DETECTED. The SARS-CoV-2 RNA is generally detectable  in upper and lower respiratory specimens during the acute phase of infection. Negative results do not preclude SARS-CoV-2 infection, do not rule out co-infections with other pathogens, and should not be used as the sole basis for treatment or other patient management decisions. Negative results must be combined with clinical observations, patient history, and epidemiological information. The expected result is Negative. Fact Sheet for Patients: HairSlick.no Fact Sheet for Healthcare Providers: quierodirigir.com This test is not yet approved or cleared by the [place] FDA and  has been authorized for detection and/or diagnosis of SARS-CoV-2 by FDA under an Emergency Use Authorization (EUA). This EUA will remain  in effect (meaning this test can be used) for the duration of the COVID-19 declaration under Section 56 4(b)(1) of the Act, 21 U.S.C. section 360bbb-3(b)(1), unless the authorization is terminated or revoked sooner. Performed at [facility], [address], [place], [place] [address]          Radiology Studies: Dg Chest [facility] 1 View  Result Date: 04/13/2019 CLINICAL DATA:  Pt transported from home by EMS, pt transported for [facility], pt unable to tolerate NRB. Pt 78 % on 3L, pt is on home 02. Cough noted x 2 weeks, productive. Pt reports she has been out of her inhaler, 18 IV est by EMS. EXAM: PORTABLE CHEST 1 VIEW COMPARISON:  11/27/2018 FINDINGS: Cardiac silhouette is normal  in size. No mediastinal or hilar masses. There are streaky opacities at the lung bases consistent with scarring or atelectasis, similar to the prior study. Remainder of the lungs is clear. No pleural effusion or pneumothorax. Left anterior chest wall pacemaker is stable. Skeletal structures are grossly intact. IMPRESSION: No acute cardiopulmonary disease. Electronically Signed   By: Amie Portlandavid  Ormond M.D.   On: 04/13/2019 21:44   Vas [place] Lower  Extremity Venous (dvt) (only [facility])  Result Date: 04/14/2019  Lower Venous Study Indications: Swelling.  Limitations: Body habitus and poor ultrasound/tissue interface. Comparison Study: no prior Performing Technologist: Blanch MediaMegan Riddle RVS  Examination Guidelines: A complete evaluation includes B-mode imaging, spectral Doppler, color Doppler, and power Doppler as needed of all accessible portions of each vessel. Bilateral testing is considered an integral part of a complete examination. Limited examinations for reoccurring indications may be performed as noted.  +---------+---------------+---------+-----------+----------+--------------+  RIGHT     Compressibility Phasicity Spontaneity Properties Thrombus Aging  +---------+---------------+---------+-----------+----------+--------------+  CFV       Full            Yes       Yes                                    +---------+---------------+---------+-----------+----------+--------------+  SFJ       Full                                                             +---------+---------------+---------+-----------+----------+--------------+  FV Prox   Full                                                             +---------+---------------+---------+-----------+----------+--------------+  FV Mid    Full                                                             +---------+---------------+---------+-----------+----------+--------------+  FV Distal Full                                                             +---------+---------------+---------+-----------+----------+--------------+  PFV       Full                                                             +---------+---------------+---------+-----------+----------+--------------+  POP       Full            Yes  Yes                                    +---------+---------------+---------+-----------+----------+--------------+  PTV       Full                                                              +---------+---------------+---------+-----------+----------+--------------+  PERO                                                       Not visualized  +---------+---------------+---------+-----------+----------+--------------+   +---------+---------------+---------+-----------+----------+--------------+  LEFT      Compressibility Phasicity Spontaneity Properties Thrombus Aging  +---------+---------------+---------+-----------+----------+--------------+  CFV       Full            Yes       Yes                                    +---------+---------------+---------+-----------+----------+--------------+  SFJ       Full                                                             +---------+---------------+---------+-----------+----------+--------------+  FV Prox   Full                                                             +---------+---------------+---------+-----------+----------+--------------+  FV Mid    Full                                                             +---------+---------------+---------+-----------+----------+--------------+  FV Distal Full                                                             +---------+---------------+---------+-----------+----------+--------------+  PFV       Full                                                             +---------+---------------+---------+-----------+----------+--------------+  POP       Full  Yes       Yes                                    +---------+---------------+---------+-----------+----------+--------------+  PTV                                                        Not visualized  +---------+---------------+---------+-----------+----------+--------------+  PERO                                                       Not visualized  +---------+---------------+---------+-----------+----------+--------------+     Summary: Right: There is no evidence of deep vein thrombosis in the lower extremity. No cystic structure found in the  popliteal fossa. Left: There is no evidence of deep vein thrombosis in the lower extremity. However, portions of this examination were limited- see technologist comments above. No cystic structure found in the popliteal fossa.  *See table(s) above for measurements and observations. Electronically signed by Gretta Began MD on 04/14/2019 at 1:36:29 PM.    Final         Scheduled Meds:  amiodarone  200 mg Oral QHS   apixaban  5 mg Oral BID   bisoprolol  5 mg Oral Daily   doxycycline  100 mg Oral Q12H   furosemide  40 mg Oral BID   ipratropium-albuterol  3 mL Nebulization TID   levothyroxine  50 mcg Oral QAC breakfast   methylPREDNISolone (SOLU-MEDROL) injection  60 mg Intravenous Q6H   ondansetron  4 mg Oral Once   pantoprazole  40 mg Oral Q1200   sodium chloride flush  3 mL Intravenous Q12H   spironolactone  25 mg Oral Daily   Continuous Infusions:  sodium chloride       LOS: 1 day   Time spent=    Dyesha Henault Joline Maxcy, MD [facility]  If 7PM-7AM, please contact night-coverage  04/15/2019, 10:13 AM

## 2019-04-15 NOTE — Plan of Care (Signed)
  Problem: Clinical Measurements: Goal: Ability to maintain clinical measurements within normal limits will improve Outcome: Progressing   Problem: Clinical Measurements: Goal: Respiratory complications will improve Outcome: Progressing   Problem: Activity: Goal: Risk for activity intolerance will decrease Outcome: Progressing   

## 2019-04-15 NOTE — Progress Notes (Signed)
Patient o2 sat while ambulating dropped down to 78% on r/a. Patient states she felt a little SOB, denies chest pain. At rest patient was sating at 98% on 2lpm via nasal cannula.

## 2019-04-16 DIAGNOSIS — Z9189 Other specified personal risk factors, not elsewhere classified: Secondary | ICD-10-CM

## 2019-04-16 LAB — GLUCOSE, CAPILLARY: Glucose-Capillary: 138 mg/dL — ABNORMAL HIGH (ref 70–99)

## 2019-04-16 MED ORDER — UMECLIDINIUM-VILANTEROL 62.5-25 MCG/INH IN AEPB
1.0000 | INHALATION_SPRAY | Freq: Every day | RESPIRATORY_TRACT | Status: DC
  Administered 2019-04-16: 1 via RESPIRATORY_TRACT
  Filled 2019-04-16: qty 14

## 2019-04-16 MED ORDER — PREDNISONE 50 MG PO TABS: 50.0000 mg | ORAL_TABLET | Freq: Every day | ORAL | 0 refills | Status: DC

## 2019-04-16 MED ORDER — IPRATROPIUM-ALBUTEROL 0.5-2.5 (3) MG/3ML IN SOLN: 3.0000 mL | Freq: Four times a day (QID) | RESPIRATORY_TRACT | 1 refills | Status: AC | PRN

## 2019-04-16 MED ORDER — STIOLTO RESPIMAT 2.5-2.5 MCG/ACT IN AERS: 2.0000 | INHALATION_SPRAY | Freq: Every day | RESPIRATORY_TRACT | 1 refills | Status: AC

## 2019-04-16 MED ORDER — IPRATROPIUM-ALBUTEROL 0.5-2.5 (3) MG/3ML IN SOLN: 3.0000 mL | RESPIRATORY_TRACT | Status: DC | PRN

## 2019-04-16 MED ORDER — UMECLIDINIUM-VILANTEROL 62.5-25 MCG/INH IN AEPB: 1.0000 | INHALATION_SPRAY | Freq: Every day | RESPIRATORY_TRACT | 1 refills | Status: DC

## 2019-04-16 MED ORDER — IPRATROPIUM-ALBUTEROL 0.5-2.5 (3) MG/3ML IN SOLN: 3.0000 mL | Freq: Four times a day (QID) | RESPIRATORY_TRACT | 1 refills | Status: DC | PRN

## 2019-04-16 NOTE — TOC Transition Note (Addendum)
Transition of Care [facility]) - CM/SW Discharge Note   Patient Details  Name: Carrie Walters MRN: [id number] Date of Birth: May 22, 1956  [facility] of [facility]) CM/SW Contact:  Midge Minium MSN, RN, NCM-BC, ACM-RN [phone] Phone Number: 04/16/2019, 11:25 AM   Clinical Narrative:    CM following for dispositional needs. CM spoke to the patient to discuss the POC. The patient lives at home with her daughter and was independent utilizing her assistive devices; Patient is on continuous O2 @ 3LPM. PCP/Demo verified. Patient will need a nebulizer at discharge with preference provided; [employer] selected. DME referral given to Learta Codding [facility] liaison); AVS updated. Patient requesting transportation home (daughter is in [place]), with PTAR being arranged d/t the patient requiring continuous O2; patients O2 tanks are at home (patient have a key for her residence). Huey Romans will deliver the patients nebulizer to her home post-discharge. No further needs from CM.   Final next level of care: Home/Self Care Barriers to Discharge: No Barriers Identified   Patient Goals and CMS Choice Patient states their goals for this hospitalization and ongoing recovery are:: "to get back home" CMS Medicare.gov Compare Post Acute Care list provided to:: Patient Choice offered to / list presented to : Patient   Discharge Plan and Services                DME Arranged: Nebulizer/meds DME Agency: [place] Date DME Agency Contacted: 04/16/19 Time DME Agency Contacted: 0737 Representative spoke with at DME Agency: Learta Codding (liaison) [place] Arranged: NA [place] Agency: NA        Social Determinants of Health ([place]) Interventions     Readmission Risk Interventions Readmission Risk Prevention Plan 04/16/2019 04/15/2019  Transportation Screening - Complete  PCP or Specialist Appt within 3-5 Days Complete Not Complete  Not Complete comments - Continued medical work-up  HRI or [place] - Complete   Social Work Consult for [place] Planning/Counseling - Complete  Palliative Care Screening - Complete  Medication Review Press photographer) - Complete

## 2019-04-16 NOTE — Discharge Summary (Signed)
Physician Discharge Summary  Carrie Walters JKK:[id number] DOB: 1956/01/01 DOA: 04/13/2019  PCP: [place], Sarah T, MD  Admit date: 04/13/2019 Discharge date: 04/16/2019  Admitted From: Home Disposition: Home  Recommendations for Outpatient Follow-up:  1. Follow up with PCP in 1-2 weeks 2. Please obtain CBC/BMP/Mag at follow up 3. Please follow up on the following pending results: None  Home Health: None Equipment/Devices: Nebulizer machine  Discharge Condition: Stable CODE STATUS: Full code  Follow-up Information    Martinique, Sarah T, MD. Schedule an appointment as soon as possible for a visit in 1 week(s).   Specialty: Family Medicine Contact information: [address]. [place] [address] [phone]        RevankarReita Cliche, MD. Schedule an appointment as soon as possible for a visit in 2 week(s).   Specialty: Cardiology Contact information: [place] [address] [place] Follow up.   Why: nebulizer machine Contact information: [place] [address] [phone]           Hospital course 63 year old with history of COPD, chronic hypoxic respiratory failure on 3 L by [place],  paroxysmal A. fib on Eliquis, hypothyroidism, ischemic cardiomyopathy, V. fib arrest status post ICD, morbid obesity presented to the hospital with shortness of breath and hypoxia. COVID-19 was negative.  She was diagnosed with COPD exacerbation.  She was treated with systemic steroid, antibiotics and breathing treatments with improvement in his symptoms.  On the day of discharge, patient was ambulated on room air and maintained saturation greater than 94%.  She was discharged on prednisone and doxycycline to complete 5 days course.  She was also started on Stiolto ([employer] was not covered by insurance) and as needed DuoNeb.   Of note, patient is on amiodarone for her A. fib which is not  ideal in the setting of a COPD.  Recommended discussing this with her cardiologist.  See individual problem list below for more on hospital course.  Discharge Diagnoses:  Acute on chronic respiratory failure with hypoxia due to COPD exacerbation-improved.  Liberated from oxygen. -Discharged on prednisone 50 mg for 3 more days and doxycycline -Stiolto, as needed DuoNeb -She is on amiodarone which is not ideal  Chronic diastolic CHF-stable -Discharged on home Lasix, Aldactone and potassium  Paroxysmal atrial fibrillation on Eliquis -Discharged on home amiodarone, bisoprolol and Eliquis.  Bilateral lower extremity asymmetric swelling left greater than right: Lower extremity Doppler negative for DVT. -Discharged on doxycycline -Recommended leg elevation.  Hypothyroidism -Discharged on home Synthroid.  CKD stage IIIa stable.  At risk for sleep apnea -Could benefit from outpatient sleep study.   Discharge Instructions  Discharge Instructions    Call MD for:  difficulty breathing, headache or visual disturbances   Complete by: As directed    Call MD for:  extreme fatigue   Complete by: As directed    Call MD for:  persistant dizziness or light-headedness   Complete by: As directed    Diet - low sodium heart healthy   Complete by: As directed    Discharge instructions   Complete by: As directed    It has been a pleasure taking care of you! You were hospitalized due to shortness of breath and low oxygen likely for COPD exacerbation.  Your breathing improved with treatment.  We are discharging on new and your old breathing treatments as well as more steroid. Please review your new  medication list and the directions before you take your medications.  Your heart medicine, amiodarone may worsen your COPD. We recommend discussing this with your cardiologist in the future.  Meanwhile, continue taking it as prescribed.    Take care,   Increase activity slowly   Complete by: As  directed      Allergies as of 04/16/2019      Reactions   Metoprolol Tartrate Shortness Of Breath   Biaxin [clarithromycin] Other (See Comments)   Sores in mouth   Levaquin [levofloxacin] Other (See Comments)   Sores in mouth   Adhesive [tape] Rash   Reaction to ekg pads   Contrast Media [iodinated Diagnostic Agents] Rash   Latex Itching, Dermatitis      Medication List    STOP taking these medications   cephALEXin 500 MG capsule Commonly known as: KEFLEX     TAKE these medications   acetaminophen 500 MG tablet Commonly known as: TYLENOL Take 500-1,000 mg by mouth every 8 (eight) hours as needed for mild pain or headache.   albuterol 108 (90 Base) MCG/ACT inhaler Commonly known as: VENTOLIN HFA Inhale 2 puffs into the lungs every 6 (six) hours as needed for wheezing or shortness of breath.   amiodarone 200 MG tablet Commonly known as: PACERONE Take 1 tablet (200 mg total) by mouth at bedtime. What changed: when to take this   apixaban 5 MG Tabs tablet Commonly known as: ELIQUIS Take 1 tablet (5 mg total) by mouth 2 (two) times daily. Restart 6/19 pm dose What changed: additional instructions   bisoprolol 5 MG tablet Commonly known as: ZEBETA Take 1 tablet (5 mg total) by mouth daily.   doxycycline 100 MG capsule Commonly known as: VIBRAMYCIN Take 100 mg by mouth daily.   fluticasone 50 MCG/ACT nasal spray Commonly known as: FLONASE Place 1 spray into both nostrils daily as needed for allergies or rhinitis.   furosemide 40 MG tablet Commonly known as: LASIX Take 1 tablet (40 mg total) by mouth 2 (two) times daily. TAKE ADDITIONAL 1/2 TAB FOR EDEMA OR DYSPNEA What changed: additional instructions   ipratropium-albuterol 0.5-2.5 (3) MG/3ML Soln Commonly known as: DUONEB Take 3 mLs by nebulization every 6 (six) hours as needed (SOB, Wheeze or cough).   Klor-Con M20 20 MEQ tablet Generic drug: potassium chloride SA TAKE 1 TABLET BY MOUTH EVERY DAY What  changed: how much to take   levothyroxine 50 MCG tablet Commonly known as: SYNTHROID Take 1 tablet (50 mcg total) by mouth daily before breakfast.   pantoprazole 40 MG tablet Commonly known as: PROTONIX Take 40 mg by mouth daily at 12 noon.   predniSONE 50 MG tablet Commonly known as: DELTASONE Take 1 tablet (50 mg total) by mouth daily with breakfast.   spironolactone 25 MG tablet Commonly known as: ALDACTONE Take 1 tablet (25 mg total) by mouth daily.   Stiolto Respimat 2.5-2.5 MCG/ACT Aers Generic drug: Tiotropium Bromide-Olodaterol Inhale 2 puffs into the lungs daily.            Durable Medical Equipment  (From admission, onward)         Start     Ordered   04/16/19 1158  For home use only DME Nebulizer machine  Once    Question Answer Comment  Patient needs a nebulizer to treat with the following condition COPD (chronic obstructive pulmonary disease) (HCC)   Length of Need Lifetime      04/16/19 1157  Consultations:  None  Procedures/Studies:  2D Echo: None  Dg Chest Port 1 View  Result Date: 04/13/2019 CLINICAL DATA:  Pt transported from home by EMS, pt transported for [facility], pt unable to tolerate NRB. Pt 78 % on 3L, pt is on home 02. Cough noted x 2 weeks, productive. Pt reports she has been out of her inhaler, 18 IV est by EMS. EXAM: PORTABLE CHEST 1 VIEW COMPARISON:  11/27/2018 FINDINGS: Cardiac silhouette is normal in size. No mediastinal or hilar masses. There are streaky opacities at the lung bases consistent with scarring or atelectasis, similar to the prior study. Remainder of the lungs is clear. No pleural effusion or pneumothorax. Left anterior chest wall pacemaker is stable. Skeletal structures are grossly intact. IMPRESSION: No acute cardiopulmonary disease. Electronically Signed   By: Amie Portland M.D.   On: 04/13/2019 21:44   Vas [place] Lower Extremity Venous (dvt) (only [facility])  Result Date: 04/14/2019  Lower Venous Study  Indications: Swelling.  Limitations: Body habitus and poor ultrasound/tissue interface. Comparison Study: no prior Performing Technologist: Blanch Media RVS  Examination Guidelines: A complete evaluation includes B-mode imaging, spectral Doppler, color Doppler, and power Doppler as needed of all accessible portions of each vessel. Bilateral testing is considered an integral part of a complete examination. Limited examinations for reoccurring indications may be performed as noted.  +---------+---------------+---------+-----------+----------+--------------+  RIGHT     Compressibility Phasicity Spontaneity Properties Thrombus Aging  +---------+---------------+---------+-----------+----------+--------------+  CFV       Full            Yes       Yes                                    +---------+---------------+---------+-----------+----------+--------------+  SFJ       Full                                                             +---------+---------------+---------+-----------+----------+--------------+  FV Prox   Full                                                             +---------+---------------+---------+-----------+----------+--------------+  FV Mid    Full                                                             +---------+---------------+---------+-----------+----------+--------------+  FV Distal Full                                                             +---------+---------------+---------+-----------+----------+--------------+  PFV       Full                                                             +---------+---------------+---------+-----------+----------+--------------+  POP       Full            Yes       Yes                                    +---------+---------------+---------+-----------+----------+--------------+  PTV       Full                                                             +---------+---------------+---------+-----------+----------+--------------+  PERO                                                        Not visualized  +---------+---------------+---------+-----------+----------+--------------+   +---------+---------------+---------+-----------+----------+--------------+  LEFT      Compressibility Phasicity Spontaneity Properties Thrombus Aging  +---------+---------------+---------+-----------+----------+--------------+  CFV       Full            Yes       Yes                                    +---------+---------------+---------+-----------+----------+--------------+  SFJ       Full                                                             +---------+---------------+---------+-----------+----------+--------------+  FV Prox   Full                                                             +---------+---------------+---------+-----------+----------+--------------+  FV Mid    Full                                                             +---------+---------------+---------+-----------+----------+--------------+  FV Distal Full                                                             +---------+---------------+---------+-----------+----------+--------------+  PFV       Full                                                             +---------+---------------+---------+-----------+----------+--------------+  POP       Full            Yes       Yes                                    +---------+---------------+---------+-----------+----------+--------------+  PTV                                                        Not visualized  +---------+---------------+---------+-----------+----------+--------------+  PERO                                                       Not visualized  +---------+---------------+---------+-----------+----------+--------------+     Summary: Right: There is no evidence of deep vein thrombosis in the lower extremity. No cystic structure found in the popliteal fossa. Left: There is no evidence of deep vein thrombosis in the lower extremity.  However, portions of this examination were limited- see technologist comments above. No cystic structure found in the popliteal fossa.  *See table(s) above for measurements and observations. Electronically signed by Gretta Began MD on 04/14/2019 at 1:36:29 PM.    Final       Discharge Exam: Vitals:   04/16/19 0812 04/16/19 1426  BP:  107/61  Pulse: 72 65  Resp: 18 18  Temp:  98.1 F (36.7 C)  SpO2: 98% 90%    GENERAL: No acute distress.  Appears well.  HEENT: MMM.  Vision and hearing grossly intact.  NECK: Supple.  No apparent JVD.  RESP:  No IWOB.  Fair air movement bilaterally. CVS:  RRR. Heart sounds normal.  ABD/GI/GU: Bowel sounds present. Soft. Non tender.  MSK/EXT:  Moves extremities. No apparent deformity or edema.  SKIN: no apparent skin lesion or wound NEURO: Awake, alert and oriented appropriately.  No gross deficit.  PSYCH: Calm. Normal affect.   The results of significant diagnostics from this hospitalization (including imaging, microbiology, ancillary and laboratory) are listed below for reference.     Microbiology: Recent Results (from the past 240 hour(s))  SARS CORONAVIRUS 2 (TAT 6-24 HRS) Nasopharyngeal Nasopharyngeal Swab     Status: None   Collection Time: 04/13/19 11:25 PM   Specimen: Nasopharyngeal Swab  Result Value Ref Range Status   SARS Coronavirus 2 NEGATIVE NEGATIVE Final    Comment: (NOTE) SARS-CoV-2 target nucleic acids are NOT DETECTED. The SARS-CoV-2 RNA is generally detectable in upper and lower respiratory specimens during the acute phase of infection. Negative results do not preclude SARS-CoV-2 infection, do not rule out co-infections with other pathogens, and should not be used as the sole basis for treatment or other patient management decisions. Negative results must be combined with clinical observations, patient history, and epidemiological information. The expected result is Negative. Fact Sheet for  Patients: HairSlick.no Fact Sheet for Healthcare Providers: quierodirigir.com This test is not yet approved or cleared by the [place] FDA and  has been authorized for detection and/or diagnosis of SARS-CoV-2 by FDA under an Emergency Use Authorization (EUA). This EUA will remain  in effect (meaning this test can be used) for the  duration of the COVID-19 declaration under Section 56 4(b)(1) of the Act, 21 U.S.C. section 360bbb-3(b)(1), unless the authorization is terminated or revoked sooner. Performed at [facility], [address] [place], [place] [address]      Labs: BNP (last 3 results) Recent Labs    11/03/18 2210 11/27/18 1632 04/14/19 0023  BNP 24.3 53.0 51.4   Basic Metabolic Panel: Recent Labs  Lab 04/14/19 0023 04/14/19 0707  NA 136 137  K 4.6 3.8  CL 98 98  CO2 28 24  GLUCOSE 166* 177*  BUN 11 10  CREATININE 1.29* 1.44*  CALCIUM 9.6 10.0   Liver Function Tests: No results for input(s): AST, ALT, ALKPHOS, BILITOT, PROT, ALBUMIN in the last 168 hours. No results for input(s): LIPASE, AMYLASE in the last 168 hours. No results for input(s): AMMONIA in the last 168 hours. CBC: Recent Labs  Lab 04/13/19 2103 04/14/19 0707  WBC 11.8* 6.9  NEUTROABS 8.4* 6.3  HGB 14.3 13.8  HCT 44.0 42.9  MCV 91.3 90.9  PLT 283 300   Cardiac Enzymes: No results for input(s): CKTOTAL, CKMB, CKMBINDEX, TROPONINI in the last 168 hours. BNP: Invalid input(s): POCBNP CBG: Recent Labs  Lab 04/14/19 0643 04/15/19 0809 04/16/19 0810  GLUCAP 177* 178* 138*   D-Dimer No results for input(s): DDIMER in the last 72 hours. Hgb A1c No results for input(s): HGBA1C in the last 72 hours. Lipid Profile No results for input(s): CHOL, HDL, LDLCALC, TRIG, CHOLHDL, LDLDIRECT in the last 72 hours. Thyroid function studies No results for input(s): TSH, T4TOTAL, T3FREE, THYROIDAB in the last 72 hours.  Invalid  input(s): FREET3 Anemia work up No results for input(s): VITAMINB12, FOLATE, FERRITIN, TIBC, IRON, RETICCTPCT in the last 72 hours. Urinalysis No results found for: COLORURINE, APPEARANCEUR, LABSPEC, PHURINE, GLUCOSEU, HGBUR, BILIRUBINUR, KETONESUR, PROTEINUR, UROBILINOGEN, NITRITE, LEUKOCYTESUR Sepsis Labs Invalid input(s): PROCALCITONIN,  WBC,  LACTICIDVEN   Time coordinating discharge: 25 minutes  SIGNED:  Almon Herculesaye T Trynity Skousen, MD  [facility] 04/16/2019, 8:37 PM  If 7PM-7AM, please contact night-coverage [email or web address] Password [email or web address]

## 2019-04-16 NOTE — Plan of Care (Signed)
  Problem: Education: Goal: Knowledge of General Education information will improve Description: Including pain rating scale, medication(s)/side effects and non-pharmacologic comfort measures Outcome: Adequate for Discharge   

## 2019-04-16 NOTE — Progress Notes (Signed)
Physical Therapy Treatment and Discharge Patient Details Name: Carrie Walters MRN: [id number] DOB: 1955/12/21 Today's Date: 04/16/2019    History of Present Illness 63yo female c/o SOB, cough, wheezing and found to be on 78% SpO2 even on her usual 3LPM O2 by EMS. Admitted for COPD exacerbation. PMH MI s/p ICD placement, HTN, A-fib, anxiety    PT Comments    Continuing work on functional mobility and activity tolerance;  Much imporved activity tolerance today, able to walk half of the unit with little appreciable DOE; On room air, and O2 sats ranged 90-94%; Managing well,a dn she voiced confidence in ability to go up steps to enter home (declined stair practice); Acute PT goals met, will sign off; OK for dc home from PT standpoint    Follow Up Recommendations  No PT follow up     Equipment Recommendations  None recommended by PT    Recommendations for Other Services       Precautions / Restrictions Precautions Precautions: Other (comment) Precaution Comments: watch HR/O2    Mobility  Bed Mobility                  Transfers Overall transfer level: Modified independent Equipment used: None                Ambulation/Gait Ambulation/Gait assistance: Supervision Gait Distance (Feet): 250 Feet Assistive device: None   Gait velocity: WNL   General Gait Details: Overall well within functional limits; walked on Room Air, and O2 sats ranged 90-94%   Stairs             Wheelchair Mobility    Modified Rankin (Stroke Patients Only)       Balance Overall balance assessment: No apparent balance deficits (not formally assessed)                                          Cognition Arousal/Alertness: Awake/alert Behavior During Therapy: WFL for tasks assessed/performed Overall Cognitive Status: Within Functional Limits for tasks assessed                                        Exercises      General Comments         Pertinent Vitals/Pain Pain Assessment: No/denies pain    Home Living                      Prior Function            PT Goals (current goals can now be found in the care plan section) Acute Rehab PT Goals Patient Stated Goal: Hopes to go home today Progress towards PT goals: Goals met/education completed, patient discharged from PT    Frequency    Min 3X/week      PT Plan Current plan remains appropriate    Co-evaluation              AM-PAC PT "6 Clicks" Mobility   Outcome Measure  Help needed turning from your back to your side while in a flat bed without using bedrails?: None Help needed moving from lying on your back to sitting on the side of a flat bed without using bedrails?: None Help needed moving to and from a bed to a chair (including a wheelchair)?: None Help  needed standing up from a chair using your arms (e.g., wheelchair or bedside chair)?: None Help needed to walk in hospital room?: None Help needed climbing 3-5 steps with a railing? : None 6 Click Score: 24    End of Session   Activity Tolerance: Patient tolerated treatment well Patient left: in chair;with call bell/phone within reach Nurse Communication: Mobility status(O2 sat range on room air) PT Visit Diagnosis: Muscle weakness (generalized) (M62.81)     Time: 9407-6808 PT Time Calculation (min) (ACUTE ONLY): 16 min  Charges:  $Gait Training: 8-22 mins                     Roney Marion, [place] Pager [phone] Office [place] 04/16/2019, 1:41 PM

## 2019-04-16 NOTE — Progress Notes (Signed)
Patient discharging home. Discharge instructions explained to patient and she verbalized understanding. Packed all personal belongings. No further questions or concerns voiced. Awaiting transportation.

## 2019-04-16 NOTE — Plan of Care (Signed)
Per PT, pt O2 sat 94% while ambulating on room air, MD [place] notified.   Problem: Education: Goal: Knowledge of General Education information will improve Description: Including pain rating scale, medication(s)/side effects and non-pharmacologic comfort measures Outcome: Completed/Met   Problem: Health Behavior/Discharge Planning: Goal: Ability to manage health-related needs will improve Outcome: Completed/Met   Problem: Clinical Measurements: Goal: Ability to maintain clinical measurements within normal limits will improve Outcome: Completed/Met Goal: Will remain free from infection Outcome: Completed/Met Goal: Diagnostic test results will improve Outcome: Completed/Met Goal: Respiratory complications will improve Outcome: Completed/Met Goal: Cardiovascular complication will be avoided Outcome: Completed/Met   Problem: Activity: Goal: Risk for activity intolerance will decrease Outcome: Completed/Met   Problem: Nutrition: Goal: Adequate nutrition will be maintained Outcome: Completed/Met   Problem: Coping: Goal: Level of anxiety will decrease Outcome: Completed/Met   Problem: Elimination: Goal: Will not experience complications related to bowel motility Outcome: Completed/Met Goal: Will not experience complications related to urinary retention Outcome: Completed/Met   Problem: Pain Managment: Goal: General experience of comfort will improve Outcome: Completed/Met   Problem: Safety: Goal: Ability to remain free from injury will improve Outcome: Completed/Met   Problem: Skin Integrity: Goal: Risk for impaired skin integrity will decrease Outcome: Completed/Met

## 2019-04-18 ENCOUNTER — Other Ambulatory Visit (HOSPITAL_COMMUNITY): Payer: Self-pay | Admitting: Cardiology

## 2019-04-21 NOTE — Progress Notes (Signed)
Remote ICD transmission.   

## 2019-06-23 ENCOUNTER — Telehealth: Payer: Self-pay

## 2019-06-23 NOTE — Telephone Encounter (Signed)
ICD alert received for High Risk of HF.  Optivol level >200, pt activy <1hr/ day for 4 weeks.  Pt presenting rhythm AFl with increased AF episodes over past couple of days (>6hrs x 2days).  Pt has known AF on OAC and Amiodarone.    Spoke with pt, she reports she has had increased SOB with exertion x 1 week.  Pt reports her LE are swollen at baseline, swelling improves with elevation.  Pt does not weigh herself daily and does not feel that she has gained any weight.  Discuss med compliance with pt, she reports she only takes lasix and potassium once every other day due to difficulty swallowing pills.  Educated pt on importance of close monitoring weight and medication compliance.  Advised can crush lasix pill and if necessary dissolve K-dur in glass of water, pt indicates pharmacist told her that as well.   Forwarding to CHF clinic for f/u with pt.

## 2019-06-26 ENCOUNTER — Encounter (HOSPITAL_COMMUNITY): Payer: Self-pay | Admitting: Emergency Medicine

## 2019-06-26 ENCOUNTER — Emergency Department (HOSPITAL_COMMUNITY): Payer: Medicaid Other

## 2019-06-26 ENCOUNTER — Emergency Department (HOSPITAL_COMMUNITY)
Admission: EM | Admit: 2019-06-26 | Discharge: 2019-06-27 | Disposition: A | Discharge status: Home / Self Care | Payer: Medicaid Other | Attending: Emergency Medicine | Admitting: Emergency Medicine

## 2019-06-26 ENCOUNTER — Other Ambulatory Visit: Payer: Self-pay

## 2019-06-26 DIAGNOSIS — R0602 Shortness of breath: Secondary | ICD-10-CM | POA: Diagnosis present

## 2019-06-26 DIAGNOSIS — Z87891 Personal history of nicotine dependence: Secondary | ICD-10-CM | POA: Diagnosis not present

## 2019-06-26 DIAGNOSIS — Z7901 Long term (current) use of anticoagulants: Secondary | ICD-10-CM | POA: Diagnosis not present

## 2019-06-26 DIAGNOSIS — Z950 Presence of cardiac pacemaker: Secondary | ICD-10-CM | POA: Insufficient documentation

## 2019-06-26 DIAGNOSIS — N183 Chronic kidney disease, stage 3 unspecified: Secondary | ICD-10-CM | POA: Diagnosis not present

## 2019-06-26 DIAGNOSIS — I4891 Unspecified atrial fibrillation: Secondary | ICD-10-CM | POA: Insufficient documentation

## 2019-06-26 DIAGNOSIS — I252 Old myocardial infarction: Secondary | ICD-10-CM | POA: Diagnosis not present

## 2019-06-26 DIAGNOSIS — I5032 Chronic diastolic (congestive) heart failure: Secondary | ICD-10-CM | POA: Diagnosis not present

## 2019-06-26 DIAGNOSIS — Z95811 Presence of heart assist device: Secondary | ICD-10-CM | POA: Diagnosis not present

## 2019-06-26 DIAGNOSIS — Z79899 Other long term (current) drug therapy: Secondary | ICD-10-CM | POA: Diagnosis not present

## 2019-06-26 DIAGNOSIS — E039 Hypothyroidism, unspecified: Secondary | ICD-10-CM | POA: Insufficient documentation

## 2019-06-26 DIAGNOSIS — I130 Hypertensive heart and chronic kidney disease with heart failure and stage 1 through stage 4 chronic kidney disease, or unspecified chronic kidney disease: Secondary | ICD-10-CM | POA: Insufficient documentation

## 2019-06-26 DIAGNOSIS — Z91040 Latex allergy status: Secondary | ICD-10-CM | POA: Insufficient documentation

## 2019-06-26 DIAGNOSIS — J441 Chronic obstructive pulmonary disease with (acute) exacerbation: Secondary | ICD-10-CM | POA: Diagnosis not present

## 2019-06-26 MED ORDER — ALBUTEROL SULFATE HFA 108 (90 BASE) MCG/ACT IN AERS
6.0000 | INHALATION_SPRAY | Freq: Once | RESPIRATORY_TRACT | Status: AC
  Administered 2019-06-26: 6 via RESPIRATORY_TRACT
  Filled 2019-06-26: qty 6.7

## 2019-06-26 MED ORDER — IPRATROPIUM BROMIDE HFA 17 MCG/ACT IN AERS
2.0000 | INHALATION_SPRAY | Freq: Once | RESPIRATORY_TRACT | Status: AC
  Administered 2019-06-26: 2 via RESPIRATORY_TRACT
  Filled 2019-06-26: qty 12.9

## 2019-06-26 NOTE — ED Triage Notes (Signed)
Pt arrived from home via [facility] due to an alert from her MD regarding her implanted defibrillator reading possible fluid on the lungs and runs of Afib.Alert received by pt yesterday. PT also c/o tightness in her chest 7/10 and "harder to breath" for 3 days getting worse today. Pt alert and fully oriented, VSS

## 2019-06-27 LAB — COMPREHENSIVE METABOLIC PANEL
ALT: 11 U/L (ref 0–44)
AST: 13 U/L — ABNORMAL LOW (ref 15–41)
Albumin: 4.2 g/dL (ref 3.5–5.0)
Alkaline Phosphatase: 93 U/L (ref 38–126)
Anion gap: 12 (ref 5–15)
BUN: 12 mg/dL (ref 8–23)
CO2: 30 mmol/L (ref 22–32)
Calcium: 9.6 mg/dL (ref 8.9–10.3)
Chloride: 98 mmol/L (ref 98–111)
Creatinine, Ser: 1.1 mg/dL — ABNORMAL HIGH (ref 0.44–1.00)
GFR calc Af Amer: >60 mL/min (ref >60)
GFR calc non Af Amer: 53 mL/min — ABNORMAL LOW (ref >60)
Glucose, Bld: 110 mg/dL — ABNORMAL HIGH (ref 70–99)
Potassium: 4 mmol/L (ref 3.5–5.1)
Sodium: 140 mmol/L (ref 135–145)
Total Bilirubin: 0.8 mg/dL (ref 0.3–1.2)
Total Protein: 7.6 g/dL (ref 6.5–8.1)

## 2019-06-27 LAB — POCT I-STAT 7, (LYTES, BLD GAS, ICA,H+H)
Acid-Base Excess: 5 mmol/L — ABNORMAL HIGH (ref 0.0–2.0)
Bicarbonate: 30 mmol/L — ABNORMAL HIGH (ref 20.0–28.0)
Calcium, Ion: 1.19 mmol/L (ref 1.15–1.40)
HCT: 42 % (ref 36.0–46.0)
Hemoglobin: 14.3 g/dL (ref 12.0–15.0)
O2 Saturation: 92 %
Patient temperature: 98.7
Potassium: 3.9 mmol/L (ref 3.5–5.1)
Sodium: 138 mmol/L (ref 135–145)
TCO2: 31 mmol/L (ref 22–32)
pCO2 arterial: 42.3 mmHg (ref 32.0–48.0)
pH, Arterial: 7.458 — ABNORMAL HIGH (ref 7.350–7.450)
pO2, Arterial: 62 mmHg — ABNORMAL LOW (ref 83.0–108.0)

## 2019-06-27 LAB — CBC WITH DIFFERENTIAL/PLATELET
Abs Immature Granulocytes: 0.05 10*3/uL (ref 0.00–0.07)
Basophils Absolute: 0.1 10*3/uL (ref 0.0–0.1)
Basophils Relative: 1 %
Eosinophils Absolute: 0.6 10*3/uL — ABNORMAL HIGH (ref 0.0–0.5)
Eosinophils Relative: 5 %
HCT: 43.6 % (ref 36.0–46.0)
Hemoglobin: 13.7 g/dL (ref 12.0–15.0)
Immature Granulocytes: 1 %
Lymphocytes Relative: 23 %
Lymphs Abs: 2.5 10*3/uL (ref 0.7–4.0)
MCH: 29.3 pg (ref 26.0–34.0)
MCHC: 31.4 g/dL (ref 30.0–36.0)
MCV: 93.4 fL (ref 80.0–100.0)
Monocytes Absolute: 0.7 10*3/uL (ref 0.1–1.0)
Monocytes Relative: 6 %
Neutro Abs: 7.1 10*3/uL (ref 1.7–7.7)
Neutrophils Relative %: 64 %
Platelets: 327 10*3/uL (ref 150–400)
RBC: 4.67 MIL/uL (ref 3.87–5.11)
RDW: 16 % — ABNORMAL HIGH (ref 11.5–15.5)
WBC: 11 10*3/uL — ABNORMAL HIGH (ref 4.0–10.5)
nRBC: 0 % (ref 0.0–0.2)

## 2019-06-27 LAB — BRAIN NATRIURETIC PEPTIDE: B Natriuretic Peptide: 41.4 pg/mL (ref 0.0–100.0)

## 2019-06-27 MED ORDER — ALBUTEROL SULFATE HFA 108 (90 BASE) MCG/ACT IN AERS
6.0000 | INHALATION_SPRAY | Freq: Once | RESPIRATORY_TRACT | Status: AC
  Administered 2019-06-27: 01:00:00 6 via RESPIRATORY_TRACT

## 2019-06-27 MED ORDER — IPRATROPIUM BROMIDE HFA 17 MCG/ACT IN AERS: 2.0000 | INHALATION_SPRAY | Freq: Once | RESPIRATORY_TRACT | Status: DC

## 2019-06-27 MED ORDER — ALBUTEROL SULFATE HFA 108 (90 BASE) MCG/ACT IN AERS: 4.0000 | INHALATION_SPRAY | Freq: Once | RESPIRATORY_TRACT | Status: DC

## 2019-06-27 MED ORDER — PREDNISONE 10 MG PO TABS: 40.0000 mg | ORAL_TABLET | Freq: Every day | ORAL | 0 refills | Status: AC

## 2019-06-27 MED ORDER — PREDNISONE 20 MG PO TABS
40.0000 mg | ORAL_TABLET | Freq: Once | ORAL | Status: AC
  Administered 2019-06-27: 40 mg via ORAL
  Filled 2019-06-27: qty 2

## 2019-06-27 NOTE — ED Notes (Signed)
While ambulating pt's O2 stayed between 91 and 96. Pt denies any shortness of breath or difficulty ambulating.

## 2019-06-27 NOTE — ED Provider Notes (Signed)
[facility] EMERGENCY DEPARTMENT Provider Note  CSN: [id number] Arrival date & time: 06/26/19 2138  Chief Complaint(s) Pacemaker Problem  HPI Carrie Walters is a 64 y.o. female with a history of COPD not on supplemental oxygen, diastolic heart failure and A. fib status post ICD and pacemaker who presents to the emergency department with 1 week of gradually worsening shortness of breath, worse with exertion.  Patient was contacted by the device nurse who recommended she seek medical evaluation due to possible volume overload per the device.  Patient denies any worsening edema.  Patient reports that she has been having dry cough.  States that her shortness of breath is similar to her prior COPD exacerbations which typically resolved with albuterol however albuterol has not been helping.  She denies any fevers or chills.  No chest pain.  No nausea or vomiting.  No other physical complaints.  HPI  Past Medical History Past Medical History:  Diagnosis Date  . Anxiety   . Arthritis   . Asthma   . Atrial fibrillation (HCC)   . Cellulitis   . Dysrhythmia   . Hypertension   . Hypothyroidism   . MI (myocardial infarction) [facility])    Patient Active Problem List   Diagnosis Date Noted  . COPD with acute exacerbation (HCC) 04/14/2019  . CKD (chronic kidney disease), stage III 04/14/2019  . Acute-on-chronic respiratory failure (HCC) 04/14/2019  . Acute on chronic respiratory failure (HCC) 04/14/2019  . Chronic respiratory failure with hypoxia (HCC)   . ICD (implantable cardioverter-defibrillator) pocket hematoma 11/12/2018  . Hx of ventricular fibrillation 11/04/2018  . Chronic diastolic CHF (congestive heart failure) (HCC) 04/02/2018  . Diarrhea 03/14/2018  . AF (paroxysmal atrial fibrillation) (HCC) 02/21/2018  . Atrial flutter (HCC) 12/27/2017  . Morbid obesity (HCC) 12/27/2017  . Hypothyroidism 12/21/2017   Home Medication(s) Prior to Admission medications   Medication  Sig Start Date End Date Taking? Authorizing Provider  acetaminophen (TYLENOL) 500 MG tablet Take 500-1,000 mg by mouth every 8 (eight) hours as needed for mild pain or headache.    Yes [provider]  albuterol (PROVENTIL HFA;VENTOLIN HFA) 108 (90 Base) MCG/ACT inhaler Inhale 2 puffs into the lungs every 6 (six) hours as needed for wheezing or shortness of breath.   Yes [provider]  amiodarone (PACERONE) 200 MG tablet Take 1 tablet (200 mg total) by mouth at bedtime. Patient taking differently: Take 200 mg by mouth daily at 12 noon.  09/11/18  Yes Clegg, Amy D, NP  apixaban (ELIQUIS) 5 MG TABS tablet Take 1 tablet (5 mg total) by mouth 2 (two) times daily. Restart 6/19 pm dose Patient taking differently: Take 5 mg by mouth 2 (two) times daily.  11/06/18  Yes Seiler, Amber K, NP  bisoprolol (ZEBETA) 5 MG tablet Take 1 tablet (5 mg total) by mouth daily. 07/26/18  Yes Clegg, Amy D, NP  furosemide (LASIX) 40 MG tablet Take 1 tablet (40 mg total) by mouth 2 (two) times daily. TAKE ADDITIONAL 1/2 TAB FOR EDEMA OR DYSPNEA Patient taking differently: Take 40 mg by mouth 2 (two) times daily.  10/07/18  Yes Clegg, Amy D, NP  ipratropium-albuterol (DUONEB) 0.5-2.5 (3) MG/3ML SOLN Take 3 mLs by nebulization every 6 (six) hours as needed (SOB, Wheeze or cough). 04/16/19  Yes Gonfa, Boyce Medici, MD  KLOR-CON M20 20 MEQ tablet TAKE 1 TABLET BY MOUTH EVERY DAY Patient taking differently: Take 20 mEq by mouth daily.  04/21/19  Yes [name],  MD  levothyroxine (SYNTHROID, LEVOTHROID) 50 MCG tablet Take 1 tablet (50 mcg total) by mouth daily before breakfast. 07/27/18  Yes Clegg, Amy D, NP  montelukast (SINGULAIR) 10 MG tablet Take 10 mg by mouth at bedtime.   Yes [provider]  pantoprazole (PROTONIX) 40 MG tablet Take 40 mg by mouth daily at 12 noon.   Yes [provider]  spironolactone (ALDACTONE) 25 MG tablet Take 1 tablet (25 mg total) by mouth daily. 09/24/18  Yes Clegg, Amy  D, NP  Tiotropium Bromide-Olodaterol (STIOLTO RESPIMAT) 2.5-2.5 MCG/ACT AERS Inhale 2 puffs into the lungs daily. 04/16/19  Yes Almon Hercules, MD  predniSONE (DELTASONE) 10 MG tablet Take 4 tablets (40 mg total) by mouth daily for 4 days. 06/27/19 07/01/19  Nira Conn, MD                                                                                                                                    Past Surgical History Past Surgical History:  Procedure Laterality Date  . CARDIOVERSION N/A 03/18/2018   Procedure: CARDIOVERSION;  Surgeon: [name], MD;  Location: [facility] ENDOSCOPY;  Service: Cardiovascular;  Laterality: N/A;  . CHOLECYSTECTOMY    . ICD IMPLANT N/A 11/05/2018   Procedure: ICD IMPLANT;  Surgeon: Hillis Range, MD;  Location: [facility] INVASIVE CV LAB;  Service: Cardiovascular;  Laterality: N/A;  . LEFT HEART CATH AND CORONARY ANGIOGRAPHY N/A 02/25/2018   Procedure: LEFT HEART CATH AND CORONARY ANGIOGRAPHY;  Surgeon: [name], MD;  Location: [facility] INVASIVE CV LAB;  Service: Cardiovascular;  Laterality: N/A;  . TEE WITHOUT CARDIOVERSION N/A 03/18/2018   Procedure: TRANSESOPHAGEAL ECHOCARDIOGRAM (TEE);  Surgeon: [name], MD;  Location: [facility] ENDOSCOPY;  Service: Cardiovascular;  Laterality: N/A;  . UMBILICAL HERNIA REPAIR     Family History Family History  Problem Relation Age of Onset  . Heart disease Father   . Atrial fibrillation Brother     Social History Social History   Tobacco Use  . Smoking status: Former Games developer  . Smokeless tobacco: Never Used  Substance Use Topics  . Alcohol use: Never  . Drug use: Never   Allergies Metoprolol tartrate, Biaxin [clarithromycin], Levaquin [levofloxacin], Adhesive [tape], Contrast media [iodinated diagnostic agents], and Latex  Review of Systems Review of Systems All other systems are reviewed and are negative for acute change except as noted in the HPI  Physical Exam Vital Signs  I have reviewed the  triage vital signs BP 110/60   Pulse 73   Temp 98.7 F (37.1 C) (Oral)   Resp 17   Ht 5\' 6"  (1.676 m)   Wt 113.4 kg   SpO2 96%   BMI 40.35 kg/m   Physical Exam Vitals reviewed.  Constitutional:      General: She is not in acute distress.    Appearance: She is well-developed. She is morbidly obese. She is not diaphoretic.  HENT:     Head: Normocephalic and atraumatic.     Nose: Nose normal.  Eyes:     General: No scleral icterus.       Right eye: No discharge.        Left eye: No discharge.     Conjunctiva/sclera: Conjunctivae normal.     Pupils: Pupils are equal, round, and reactive to light.  Cardiovascular:     Rate and Rhythm: Normal rate and regular rhythm.     Heart sounds: No murmur. No friction rub. No gallop.   Pulmonary:     Effort: Tachypnea and prolonged expiration present.     Breath sounds: No stridor. Wheezing (insp and exp) present. No rales.  Abdominal:     General: There is no distension.     Palpations: Abdomen is soft.     Tenderness: There is no abdominal tenderness.  Musculoskeletal:        General: No tenderness.     Cervical back: Normal range of motion and neck supple.  Skin:    General: Skin is warm and dry.     Findings: No erythema or rash.  Neurological:     Mental Status: She is alert and oriented to person, place, and time.     ED Results and Treatments Labs (all labs ordered are listed, but only abnormal results are displayed) Labs Reviewed  CBC WITH DIFFERENTIAL/PLATELET - Abnormal; Notable for the following components:      Result Value   WBC 11.0 (*)    RDW 16.0 (*)    Eosinophils Absolute 0.6 (*)    All other components within normal limits  COMPREHENSIVE METABOLIC PANEL - Abnormal; Notable for the following components:   Glucose, Bld 110 (*)    Creatinine, Ser 1.10 (*)    AST 13 (*)    GFR calc non Af Amer 53 (*)    All other components within normal limits  POCT I-STAT 7, (LYTES, BLD GAS, ICA,H+H) - Abnormal; Notable  for the following components:   pH, Arterial 7.458 (*)    pO2, Arterial 62.0 (*)    Bicarbonate 30.0 (*)    Acid-Base Excess 5.0 (*)    All other components within normal limits  BRAIN NATRIURETIC PEPTIDE  BLOOD GAS, ARTERIAL                                                                                                                         EKG  EKG Interpretation  Date/Time:  Thursday June 26 2019 21:48:04 EST Ventricular Rate:  65 PR Interval:    QRS Duration: 105 QT Interval:  449 QTC Calculation: 467 R Axis:   7 Text Interpretation: Sinus rhythm Short PR interval Low voltage, precordial leads Confirmed by Addison Lank [phone]) on 06/26/2019 11:03:57 PM      Radiology DG Chest Port 1 View  Result Date: 06/26/2019 CLINICAL DATA:  Vascular congestion.  Chest tightness. EXAM: PORTABLE CHEST 1 VIEW COMPARISON:  April 13, 2019 FINDINGS:  There is a dual chamber left-sided pacemaker/ICD in place. There is no pneumothorax or pleural effusion. The heart size is stable from prior study. Aortic calcifications are noted. There is no acute osseous abnormality. IMPRESSION: No active disease. Electronically Signed   By: Katherine Mantle M.D.   On: 06/26/2019 23:47    Pertinent labs & imaging results that were available during my care of the patient were reviewed by me and considered in my medical decision making (see chart for details).  Medications Ordered in ED Medications  ipratropium (ATROVENT HFA) inhaler 2 puff (2 puffs Inhalation Not Given 06/27/19 0123)  albuterol (VENTOLIN HFA) 108 (90 Base) MCG/ACT inhaler 4 puff (4 puffs Inhalation Not Given 06/27/19 0201)  albuterol (VENTOLIN HFA) 108 (90 Base) MCG/ACT inhaler 6 puff (6 puffs Inhalation Given 06/26/19 2331)  ipratropium (ATROVENT HFA) inhaler 2 puff (2 puffs Inhalation Given 06/26/19 2331)  albuterol (VENTOLIN HFA) 108 (90 Base) MCG/ACT inhaler 6 puff (6 puffs Inhalation Given 06/27/19 0123)  predniSONE (DELTASONE) tablet 40 mg  (40 mg Oral Given 06/27/19 0214)                                                                                                                                    Procedures .Critical Care Performed by: Nira Conn, MD Authorized by: Nira Conn, MD     CRITICAL CARE Performed by: Amadeo Garnet Yida Hyams Total critical care time: 40 minutes Critical care time was exclusive of separately billable procedures and treating other patients. Critical care was necessary to treat or prevent imminent or life-threatening deterioration. Critical care was time spent personally by me on the following activities: development of treatment plan with patient and/or surrogate as well as nursing, discussions with consultants, evaluation of patient's response to treatment, examination of patient, obtaining history from patient or surrogate, ordering and performing treatments and interventions, ordering and review of laboratory studies, ordering and review of radiographic studies, pulse oximetry and re-evaluation of patient's condition.  (including critical care time)  Medical Decision Making / ED Course I have reviewed the nursing notes for this encounter and the patient's prior records (if available in EHR or on provided paperwork).   Carrie Walters was evaluated in Emergency Department on 06/27/2019 for the symptoms described in the history of present illness. She was evaluated in the context of the global COVID-19 pandemic, which necessitated consideration that the patient might be at risk for infection with the SARS-CoV-2 virus that causes COVID-19. Institutional protocols and algorithms that pertain to the evaluation of patients at risk for COVID-19 are in a state of rapid change based on information released by regulatory bodies including the CDC and federal and state organizations. These policies and algorithms were followed during the patient's care in the ED.  Patient presents with 1 week  of gradually worsening shortness of breath.  Lungs with inspiratory and expiratory wheezing throughout.  No focal rales.  Patient has  evidence of chronic venous stasis in the lower extremities without overt edema.  Chest x-ray without evidence of pneumonia, pulmonary edema, pneumothorax.  Patient is satting well on room air.  Device interrogation noted few rounds of A. fib.  Currently patient is in normal sinus rhythm.  Read possible fluid accumulation as well.  Work-up with a reassuring BNP.  Rest of the labs were also reassuring.  Patient was treated symptomatically with high-dose albuterol and Atrovent.  Patient also given oral steroids.  After several rounds of breathing treatments, patient's wheezing improved and shortness of breath resolved.  Patient was able to ambulate without desaturation or shortness of breath.  Feel she is appropriate for outpatient management.  The patient appears reasonably screened and/or stabilized for discharge and I doubt any other medical condition or other [facility] requiring further screening, evaluation, or treatment in the ED at this time prior to discharge.   The patient appears reasonably screened and/or stabilized for discharge and I doubt any other medical condition or other [facility] requiring further screening, evaluation, or treatment in the ED at this time prior to discharge.  Disposition: Discharge  Condition: Good  I have discussed the results, Dx and Tx plan with the patient who expressed understanding and agree(s) with the plan. Discharge instructions discussed at great length. The patient was given strict return precautions who verbalized understanding of the instructions. No further questions at time of discharge.    ED Discharge Orders         Ordered    predniSONE (DELTASONE) 10 MG tablet  Daily     06/27/19 0309            Follow Up: [place], Sarah T, MD [address] [place] [address] [phone]  Schedule an appointment as  soon as possible for a visit  As needed         Final Clinical Impression(s) / ED Diagnoses Final diagnoses:  SOB (shortness of breath)  COPD with acute exacerbation (HCC)      This chart was dictated using voice recognition software.  Despite best efforts to proofread,  errors can occur which can change the documentation meaning.   Nira Conn, MD 06/27/19 [phone]

## 2019-06-27 NOTE — ED Notes (Signed)
Ptar called for pt 

## 2019-06-27 NOTE — ED Notes (Signed)
Pt verbalized understanding of d/c instruction, follow up care, scripts and s/s requiring return to ED. Pt leaving in care of PTAR and pt had no additional questions at discharge.

## 2019-06-30 IMAGING — DX DG CHEST 1V PORT
1 series · 1 of 1 positions shown · non-contrast
Comparison: None.

CLINICAL DATA: Shortness of breath.

EXAM:
PORTABLE CHEST 1 VIEW

[chest]
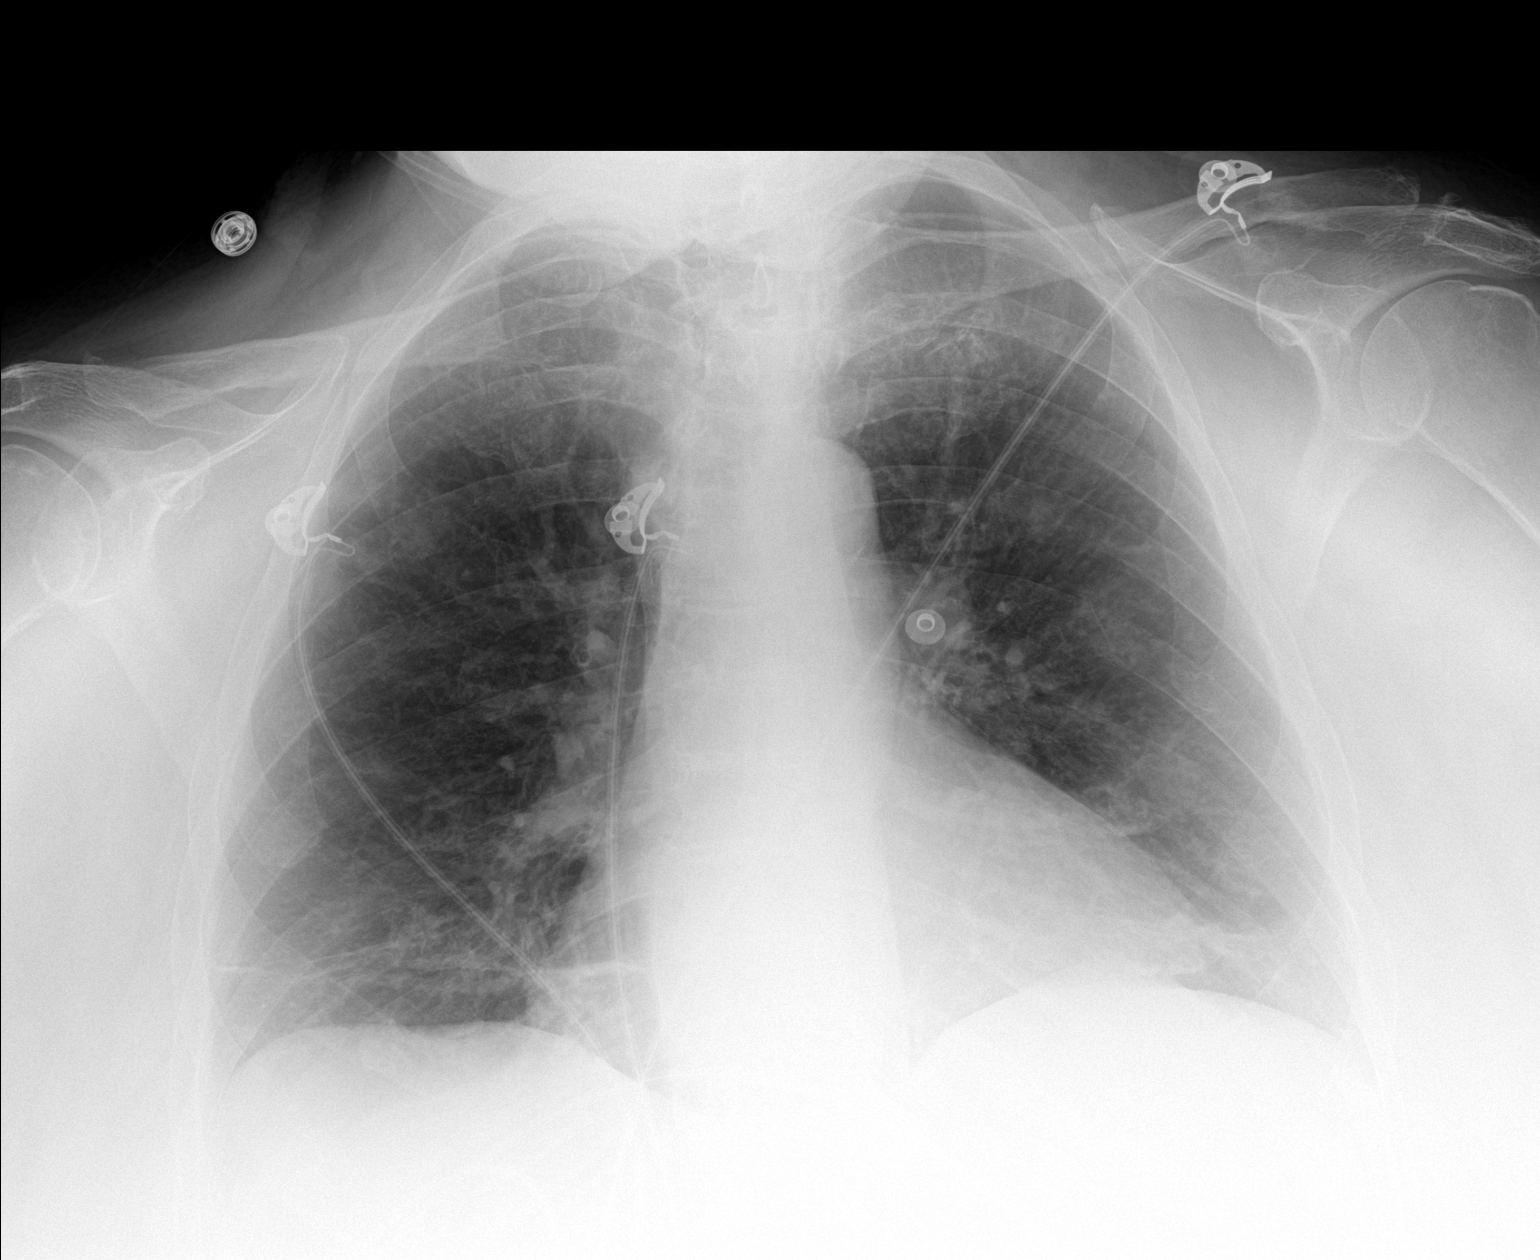

[1 of 1 positions shown; findings below may reference images not displayed]

FINDINGS: The heart size and mediastinal contours are within normal limits. No
pneumothorax or pleural effusion is noted. Minimal bibasilar
subsegmental atelectasis or scarring is noted. The visualized
skeletal structures are unremarkable.
IMPRESSION: Minimal bibasilar subsegmental atelectasis or scarring.

## 2019-07-03 NOTE — Telephone Encounter (Signed)
She has follow up next week. Thanks A

## 2019-07-04 ENCOUNTER — Ambulatory Visit (INDEPENDENT_AMBULATORY_CARE_PROVIDER_SITE_OTHER): Payer: Medicaid Other | Admitting: *Deleted

## 2019-07-04 DIAGNOSIS — I480 Paroxysmal atrial fibrillation: Secondary | ICD-10-CM

## 2019-07-07 ENCOUNTER — Telehealth: Payer: Self-pay

## 2019-07-07 NOTE — Telephone Encounter (Signed)
Patient referred to [facility] clinic by Judy Pimple, RN Device clinic due to optivol suggesting possible fluid accumulation on last Carelink report. Provided ICM intro and agreed to next month follow up call.   Patient is managed by Dr Shirlee Latch and Dr Johney Frame.  She is overdue to schedule office appointment with Dr Johney Frame.  Patient went to ER on 06/26/2019 and was told she did not have any fluid on exam and no fluid showed on device transmission.  Assisted in sending remote transmission today for review of fluid levels. Remote Transmission suggesting possible fluid accumulation reoccurring within the last few days and trending under baseline.  Patient has office visit scheduled with HF clinic tomorrow.  Message sent to scheduler to call patient to schedule an appointment with Dr Johney Frame.  Patient denies any fluid symptoms at this time. ICM remote transmission scheduled for 08/04/2019.

## 2019-07-08 ENCOUNTER — Encounter (HOSPITAL_COMMUNITY): Payer: Medicaid Other

## 2019-07-08 LAB — CUP PACEART REMOTE DEVICE CHECK
Battery Remaining Longevity: 124 mo
Battery Voltage: 3.01 V
Brady Statistic AP VP Percent: 0.24 %
Brady Statistic AP VS Percent: 47.74 %
Brady Statistic AS VP Percent: 0.03 %
Brady Statistic AS VS Percent: 51.99 %
Brady Statistic RA Percent Paced: 47.27 %
Brady Statistic RV Percent Paced: 0.29 %
Date Time Interrogation Session: 20210215184500
HighPow Impedance: 72 Ohm
Implantable Lead Implant Date: 20200616
Implantable Lead Implant Date: 20200616
Implantable Lead Location: 753859
Implantable Lead Location: 753860
Implantable Lead Model: 5076
Implantable Pulse Generator Implant Date: 20200616
Lead Channel Impedance Value: 342 Ohm
Lead Channel Impedance Value: 456 Ohm
Lead Channel Impedance Value: 494 Ohm
Lead Channel Pacing Threshold Amplitude: 0.75 V
Lead Channel Pacing Threshold Amplitude: 0.875 V
Lead Channel Pacing Threshold Pulse Width: 0.4 ms
Lead Channel Pacing Threshold Pulse Width: 0.4 ms
Lead Channel Sensing Intrinsic Amplitude: 2.625 mV
Lead Channel Sensing Intrinsic Amplitude: 2.625 mV
Lead Channel Sensing Intrinsic Amplitude: 8.75 mV
Lead Channel Sensing Intrinsic Amplitude: 8.75 mV
Lead Channel Setting Pacing Amplitude: 1.5 V
Lead Channel Setting Pacing Amplitude: 2 V
Lead Channel Setting Pacing Pulse Width: 0.4 ms
Lead Channel Setting Sensing Sensitivity: 0.3 mV

## 2019-07-08 NOTE — Progress Notes (Signed)
ICD Remote  

## 2019-07-10 ENCOUNTER — Encounter: Payer: Self-pay | Admitting: Internal Medicine

## 2019-07-10 ENCOUNTER — Telehealth (INDEPENDENT_AMBULATORY_CARE_PROVIDER_SITE_OTHER): Payer: Medicaid Other | Admitting: Internal Medicine

## 2019-07-10 ENCOUNTER — Other Ambulatory Visit: Payer: Self-pay

## 2019-07-10 VITALS — Ht 66.0 in | Wt 255.0 lb

## 2019-07-10 DIAGNOSIS — Z95810 Presence of automatic (implantable) cardiac defibrillator: Secondary | ICD-10-CM

## 2019-07-10 DIAGNOSIS — I428 Other cardiomyopathies: Secondary | ICD-10-CM | POA: Diagnosis not present

## 2019-07-10 DIAGNOSIS — I5022 Chronic systolic (congestive) heart failure: Secondary | ICD-10-CM

## 2019-07-10 DIAGNOSIS — Z6841 Body Mass Index (BMI) 40.0 and over, adult: Secondary | ICD-10-CM

## 2019-07-10 DIAGNOSIS — I480 Paroxysmal atrial fibrillation: Secondary | ICD-10-CM | POA: Diagnosis not present

## 2019-07-10 DIAGNOSIS — I4901 Ventricular fibrillation: Secondary | ICD-10-CM

## 2019-07-10 NOTE — Progress Notes (Signed)
Electrophysiology TeleHealth Note  Due to national recommendations of social distancing due to [place] 19, an audio telehealth visit is felt to be most appropriate for this patient at this time.  Verbal consent was obtained by me for the telehealth visit today.  The patient does not have capability for a virtual visit.  A phone visit is therefore required today.   Date:  07/10/2019   ID:  Carrie Walters, DOB 06-23-1955, MRN [id number]  Location: patient's home  Provider location:  [place]  Evaluation Performed: Follow-up visit  PCP:  [place], Sarah T, MD   Electrophysiologist:  Dr Rayann Heman  Chief Complaint:  CHF  History of Present Illness:    Carrie Walters is a 64 y.o. female who presents via telehealth conferencing today.  Since last being seen in our clinic, the patient reports doing very well. Denies complications with ICD implantation.  She did not follow-up with me post implant as she should.   Today, she denies symptoms of palpitations, chest pain, shortness of breath,  lower extremity edema, dizziness, presyncope, or syncope.  The patient is otherwise without complaint today.  The patient denies symptoms of fevers, chills, cough, or new SOB worrisome for COVID 19.  Past Medical History:  Diagnosis Date  . Anxiety   . Arthritis   . Asthma   . Atrial fibrillation ([place])   . Cellulitis   . Dysrhythmia   . Hypertension   . Hypothyroidism   . MI (myocardial infarction) [facility])     Past Surgical History:  Procedure Laterality Date  . CARDIOVERSION N/A 03/18/2018   Procedure: CARDIOVERSION;  Surgeon: Larey Dresser, MD;  Location: [facility] ENDOSCOPY;  Service: Cardiovascular;  Laterality: N/A;  . CHOLECYSTECTOMY    . ICD IMPLANT N/A 11/05/2018   Procedure: ICD IMPLANT;  Surgeon: Thompson Grayer, MD;  Location: [place] CV LAB;  Service: Cardiovascular;  Laterality: N/A;  . LEFT HEART CATH AND CORONARY ANGIOGRAPHY N/A 02/25/2018   Procedure: LEFT HEART CATH AND CORONARY  ANGIOGRAPHY;  Surgeon: Lorretta Harp, MD;  Location: [place] CV LAB;  Service: Cardiovascular;  Laterality: N/A;  . TEE WITHOUT CARDIOVERSION N/A 03/18/2018   Procedure: TRANSESOPHAGEAL ECHOCARDIOGRAM (TEE);  Surgeon: Larey Dresser, MD;  Location: [facility] ENDOSCOPY;  Service: Cardiovascular;  Laterality: N/A;  . UMBILICAL HERNIA REPAIR      Current Outpatient Medications  Medication Sig Dispense Refill  . acetaminophen (TYLENOL) 500 MG tablet Take 500-1,000 mg by mouth every 8 (eight) hours as needed for mild pain or headache.     . albuterol (PROVENTIL HFA;VENTOLIN HFA) 108 (90 Base) MCG/ACT inhaler Inhale 2 puffs into the lungs every 6 (six) hours as needed for wheezing or shortness of breath.    Marland Kitchen amiodarone (PACERONE) 200 MG tablet Take 1 tablet (200 mg total) by mouth at bedtime. 30 tablet 6  . apixaban (ELIQUIS) 5 MG TABS tablet Take 1 tablet (5 mg total) by mouth 2 (two) times daily. Restart 6/19 pm dose 60 tablet 11  . bisoprolol (ZEBETA) 5 MG tablet Take 1 tablet (5 mg total) by mouth daily. 30 tablet 5  . furosemide (LASIX) 40 MG tablet Take 1 tablet (40 mg total) by mouth 2 (two) times daily. TAKE ADDITIONAL 1/2 TAB FOR EDEMA OR DYSPNEA 180 tablet 3  . ipratropium-albuterol (DUONEB) 0.5-2.5 (3) MG/3ML SOLN Take 3 mLs by nebulization every 6 (six) hours as needed (SOB, Wheeze or cough). 360 mL 1  . KLOR-CON M20 20 MEQ tablet TAKE 1 TABLET  BY MOUTH EVERY DAY 30 tablet 5  . levothyroxine (SYNTHROID, LEVOTHROID) 50 MCG tablet Take 1 tablet (50 mcg total) by mouth daily before breakfast. 30 tablet 6  . montelukast (SINGULAIR) 10 MG tablet Take 10 mg by mouth at bedtime.    . pantoprazole (PROTONIX) 40 MG tablet Take 40 mg by mouth daily at 12 noon.    Marland Kitchen spironolactone (ALDACTONE) 25 MG tablet Take 1 tablet (25 mg total) by mouth daily. 30 tablet 6  . Tiotropium Bromide-Olodaterol (STIOLTO RESPIMAT) 2.5-2.5 MCG/ACT AERS Inhale 2 puffs into the lungs daily. 4 g 1   No current  facility-administered medications for this visit.    Allergies:   Metoprolol tartrate, Biaxin [clarithromycin], Levaquin [levofloxacin], Adhesive [tape], Contrast media [iodinated diagnostic agents], and Latex   Social History:  The patient  reports that she has quit smoking. She has never used smokeless tobacco. She reports that she does not drink alcohol or use drugs.   Family History:  The patient's  family history includes Atrial fibrillation in her brother; Heart disease in her father.   ROS:  Please see the history of present illness.   All other systems are personally reviewed and negative.    Exam:    Vital Signs:  Ht 5\' 6"  (1.676 m)   Wt 255 lb (115.7 kg)   BMI 41.16 kg/m   Well sounding, alert and conversant   Labs/Other Tests and Data Reviewed:    Recent Labs: 11/03/2018: Magnesium 2.1; TSH 5.126 06/26/2019: ALT 11; B Natriuretic Peptide 41.4; BUN 12; Creatinine, Ser 1.10; Platelets 327 06/27/2019: Hemoglobin 14.3; Potassium 3.9; Sodium 138   Wt Readings from Last 3 Encounters:  07/10/19 255 lb (115.7 kg)  06/26/19 250 lb (113.4 kg)  04/16/19 257 lb 4.8 oz (116.7 kg)     Last device remote is reviewed from [employer] PDF which reveals normal device function, elevated optivol is noted,  afib in early February also noted   ASSESSMENT & PLAN:    1.  VF arrest Normal ICD function Remotes are uptodate She is on amiodarone  2. Paroxysmal atrial fibrillation Episode in early February noted Overall well controlled On eliquis for chads2vasc score of 3 Labs 06/26/19 reviewed The importance of close monitoring to avoid medicine toxicity is advised  3. Chronic systolic dysfunction/ nonischemic CM Recently optivole elevated, though now improving Recently enrolled in ICM device clinic with 08/24/19  4. Morbid obesity Body mass index is 41.16 kg/m. Lifestyle modification encouraged  Follow-up:  EP APP in a year Close follow-up in the CHF clinic is also  advised   Patient Risk:  after full review of this patients clinical status, I feel that they are at high risk at this time.  Today, I have spent 15 minutes with the patient with telehealth technology discussing arrhythmia management .    Randon Goldsmith, MD  07/10/2019 8:41 AM     [facility] [address] [place] [phone] (office) [phone] (fax)

## 2019-07-22 ENCOUNTER — Other Ambulatory Visit: Payer: Self-pay

## 2019-07-22 ENCOUNTER — Ambulatory Visit (HOSPITAL_COMMUNITY)
Admission: RE | Admit: 2019-07-22 | Discharge: 2019-07-22 | Disposition: A | Discharge status: Home / Self Care | Payer: Medicaid Other | Source: Ambulatory Visit | Attending: Cardiology | Admitting: Cardiology

## 2019-07-22 VITALS — BP 140/82 | HR 63 | Wt 282.4 lb

## 2019-07-22 DIAGNOSIS — J449 Chronic obstructive pulmonary disease, unspecified: Secondary | ICD-10-CM | POA: Insufficient documentation

## 2019-07-22 DIAGNOSIS — I482 Chronic atrial fibrillation, unspecified: Secondary | ICD-10-CM | POA: Insufficient documentation

## 2019-07-22 DIAGNOSIS — Z87891 Personal history of nicotine dependence: Secondary | ICD-10-CM | POA: Diagnosis not present

## 2019-07-22 DIAGNOSIS — M1990 Unspecified osteoarthritis, unspecified site: Secondary | ICD-10-CM | POA: Insufficient documentation

## 2019-07-22 DIAGNOSIS — Z8249 Family history of ischemic heart disease and other diseases of the circulatory system: Secondary | ICD-10-CM | POA: Insufficient documentation

## 2019-07-22 DIAGNOSIS — I5032 Chronic diastolic (congestive) heart failure: Secondary | ICD-10-CM

## 2019-07-22 DIAGNOSIS — I428 Other cardiomyopathies: Secondary | ICD-10-CM | POA: Insufficient documentation

## 2019-07-22 DIAGNOSIS — Z8674 Personal history of sudden cardiac arrest: Secondary | ICD-10-CM | POA: Diagnosis not present

## 2019-07-22 DIAGNOSIS — Z79899 Other long term (current) drug therapy: Secondary | ICD-10-CM | POA: Diagnosis not present

## 2019-07-22 DIAGNOSIS — I252 Old myocardial infarction: Secondary | ICD-10-CM | POA: Insufficient documentation

## 2019-07-22 DIAGNOSIS — I110 Hypertensive heart disease with heart failure: Secondary | ICD-10-CM | POA: Insufficient documentation

## 2019-07-22 DIAGNOSIS — E039 Hypothyroidism, unspecified: Secondary | ICD-10-CM | POA: Diagnosis not present

## 2019-07-22 DIAGNOSIS — I5043 Acute on chronic combined systolic (congestive) and diastolic (congestive) heart failure: Secondary | ICD-10-CM | POA: Insufficient documentation

## 2019-07-22 DIAGNOSIS — I480 Paroxysmal atrial fibrillation: Secondary | ICD-10-CM | POA: Diagnosis not present

## 2019-07-22 DIAGNOSIS — Z7901 Long term (current) use of anticoagulants: Secondary | ICD-10-CM | POA: Insufficient documentation

## 2019-07-22 LAB — BASIC METABOLIC PANEL
Anion gap: 10 (ref 5–15)
BUN: 8 mg/dL (ref 8–23)
CO2: 27 mmol/L (ref 22–32)
Calcium: 9.3 mg/dL (ref 8.9–10.3)
Chloride: 104 mmol/L (ref 98–111)
Creatinine, Ser: 1 mg/dL (ref 0.44–1.00)
GFR calc Af Amer: >60 mL/min (ref >60)
GFR calc non Af Amer: 59 mL/min — ABNORMAL LOW (ref >60)
Glucose, Bld: 102 mg/dL — ABNORMAL HIGH (ref 70–99)
Potassium: 3.3 mmol/L — ABNORMAL LOW (ref 3.5–5.1)
Sodium: 141 mmol/L (ref 135–145)

## 2019-07-22 MED ORDER — POTASSIUM CHLORIDE CRYS ER 20 MEQ PO TBCR: 40.0000 meq | EXTENDED_RELEASE_TABLET | Freq: Every day | ORAL | 1 refills | Status: AC

## 2019-07-22 MED ORDER — FUROSEMIDE 40 MG PO TABS: ORAL_TABLET | ORAL | 1 refills | Status: DC

## 2019-07-22 NOTE — Patient Instructions (Signed)
Lab work done today. We will notify you of any abnormal lab work. No news is good news!  Lab work will need to be done again in 7-10 days.  FOR 3 DAYS ONLY increase Furosemide to 80mg  (2 tabs) two times daily FOR 3 DAYS ONLY. After 3 days resume Furosemide to 60mg  every morning and 40mg  every evening.  INCREASE Potassium to (2 tabs) daily.  Please follow up with the [facility] in 2-3 weeks.  At the [facility], you and your health needs are our priority. As part of our continuing mission to provide you with exceptional heart care, we have created designated Provider Care Teams. These Care Teams include your primary Cardiologist (physician) and Advanced Practice Providers (APPs- Physician Assistants and Nurse Practitioners) who all work together to provide you with the care you need, when you need it.   You may see any of the following providers on your designated Care Team at your next follow up: Dr . Dr . Marland Kitchen, NP . Arvilla Meres, PA . Marca Ancona, PharmD   Please be sure to bring in all your medications bottles to every appointment.

## 2019-07-22 NOTE — Progress Notes (Signed)
Advanced Heart Failure Clinic Note   Referring Physician: PCP: [place], Sarah T, MD PCP-Cardiologist: Garwin Brothers, MD  AHF: Dr. Shirlee Latch  EP: Dr.  Johney Frame   Reason for Visit: H/o NICM/ HFimpEF  HPI: Carrie Walters is a 64 y.o. female with a history of chronic atrial fibrillation, hypothyroidism, COPD, HTN, morbid obesity,asthma,VF arrest 02/21/18, and systolic heart failure due to NICM (diagnosed 02/2018). Echo in 2019 showed reduced LVEF 25-30%. LHC at time of diagnosis showed normal coronaries. She was sent home w/ a LifeVest.  Readmitted 3/3 through 3/6/20after LifeVest shock noted to be VT on interrogation. Loaded on IV amiodarone and transitioned to PO amiodarone. She was also treated for LLE cellulitis. She was continued on LifeVest at discharge and later underwent ICD on 11/05/18. This is now followed by Dr. Johney Frame.     She remains on amiodarone and is on Eliquis for a/c given afib. She had recent remote device interrogation 2/15 showing normal device function, stable leads/batter status and appropriate histograms. Optivol however elevated and she was referred back to [facility] for evaluation.   Of note, she had a repeat limited echo 07/25/18 that showed normalization of her EF, up to 60-65%.   She presents to clinic for f/u. Wt up and optival c/w volume overload. Impedence down below reference curve. She has 2+ bilateral LEE on exam. She complains of exertional dyspnea w/ ADLs but no symptoms at rest. She has been told that she snores and is scheduled for outpatient sleep study later this month. She reports full compliance w/ home meds but just ran out of amiodarone 2 days ago. Plans to pick up from pharmacy today. Admits to dietary indiscretion w/ sodium. Eats out. Had pizza the other night.     Review of Systems: [y] = yes, [ ]  = no   General: Weight gain [ ] ; Weight loss [ ] ; Anorexia [ ] ; Fatigue [ ] ; Fever [ ] ; Chills [ ] ; Weakness [ ]   Cardiac: Chest pain/pressure [ ] ; Resting SOB  [ ] ; Exertional SOB [ ] ; Orthopnea [ ] ; Pedal Edema [ ] ; Palpitations [ ] ; Syncope [ ] ; Presyncope [ ] ; Paroxysmal nocturnal dyspnea[ ]   Pulmonary: Cough [ ] ; Wheezing[ ] ; Hemoptysis[ ] ; Sputum [ ] ; Snoring [ ]   GI: Vomiting[ ] ; Dysphagia[ ] ; Melena[ ] ; Hematochezia [ ] ; Heartburn[ ] ; Abdominal pain [ ] ; Constipation [ ] ; Diarrhea [ ] ; BRBPR [ ]   GU: Hematuria[ ] ; Dysuria [ ] ; Nocturia[ ]   Vascular: Pain in legs with walking [ ] ; Pain in feet with lying flat [ ] ; Non-healing sores [ ] ; Stroke [ ] ; TIA [ ] ; Slurred speech [ ] ;  Neuro: Headaches[ ] ; Vertigo[ ] ; Seizures[ ] ; Paresthesias[ ] ;Blurred vision [ ] ; Diplopia [ ] ; Vision changes [ ]   Ortho/Skin: Arthritis [ ] ; Joint pain [ ] ; Muscle pain [ ] ; Joint swelling [ ] ; Back Pain [ ] ; Rash [ ]   Psych: Depression[ ] ; Anxiety[ ]   Heme: Bleeding problems [ ] ; Clotting disorders [ ] ; Anemia [ ]   Endocrine: Diabetes [ ] ; Thyroid dysfunction[ ]    Past Medical History:  Diagnosis Date  . Anxiety   . Arthritis   . Asthma   . Atrial fibrillation (HCC)   . Cellulitis   . Dysrhythmia   . Hypertension   . Hypothyroidism   . MI (myocardial infarction) [facility])     Current Outpatient Medications  Medication Sig Dispense Refill  . acetaminophen (TYLENOL) 500 MG tablet Take 500-1,000 mg by mouth every 8 (eight) hours as  needed for mild pain or headache.     . albuterol (PROVENTIL HFA;VENTOLIN HFA) 108 (90 Base) MCG/ACT inhaler Inhale 2 puffs into the lungs every 6 (six) hours as needed for wheezing or shortness of breath.    Marland Kitchen amiodarone (PACERONE) 200 MG tablet Take 1 tablet (200 mg total) by mouth at bedtime. 30 tablet 6  . apixaban (ELIQUIS) 5 MG TABS tablet Take 1 tablet (5 mg total) by mouth 2 (two) times daily. Restart 6/19 pm dose 60 tablet 11  . bisoprolol (ZEBETA) 5 MG tablet Take 1 tablet (5 mg total) by mouth daily. 30 tablet 5  . furosemide (LASIX) 40 MG tablet Take 1 tablet (40 mg total) by mouth 2 (two) times daily. TAKE ADDITIONAL 1/2 TAB  FOR EDEMA OR DYSPNEA 180 tablet 3  . ipratropium-albuterol (DUONEB) 0.5-2.5 (3) MG/3ML SOLN Take 3 mLs by nebulization every 6 (six) hours as needed (SOB, Wheeze or cough). 360 mL 1  . KLOR-CON M20 20 MEQ tablet TAKE 1 TABLET BY MOUTH EVERY DAY 30 tablet 5  . levothyroxine (SYNTHROID, LEVOTHROID) 50 MCG tablet Take 1 tablet (50 mcg total) by mouth daily before breakfast. 30 tablet 6  . montelukast (SINGULAIR) 10 MG tablet Take 10 mg by mouth at bedtime.    . pantoprazole (PROTONIX) 40 MG tablet Take 40 mg by mouth daily at 12 noon.    Marland Kitchen spironolactone (ALDACTONE) 25 MG tablet Take 1 tablet (25 mg total) by mouth daily. 30 tablet 6  . Tiotropium Bromide-Olodaterol (STIOLTO RESPIMAT) 2.5-2.5 MCG/ACT AERS Inhale 2 puffs into the lungs daily. 4 g 1   No current facility-administered medications for this encounter.    Allergies  Allergen Reactions  . Metoprolol Tartrate Shortness Of Breath  . Biaxin [Clarithromycin] Other (See Comments)    Sores in mouth  . Levaquin [Levofloxacin] Other (See Comments)    Sores in mouth  . Adhesive [Tape] Rash    Reaction to ekg pads  . Contrast Media [Iodinated Diagnostic Agents] Rash  . Latex Itching and Dermatitis      Social History   Socioeconomic History  . Marital status: Single    Spouse name: Not on file  . Number of children: 1  . Years of education: Not on file  . Highest education level: 12th grade  Occupational History  . Not on file  Tobacco Use  . Smoking status: Former Games developer  . Smokeless tobacco: Never Used  Substance and Sexual Activity  . Alcohol use: Never  . Drug use: Never  . Sexual activity: Not on file  Other Topics Concern  . Not on file  Social History Narrative   Moved from [place]   Social Determinants of Health   Financial Resource Strain:   . Difficulty of Paying Living Expenses: Not on file  Food Insecurity:   . Worried About Programme researcher, broadcasting/film/video in the Last Year: Not on file  . Ran Out of Food in the  Last Year: Not on file  Transportation Needs:   . Lack of Transportation (Medical): Not on file  . Lack of Transportation (Non-Medical): Not on file  Physical Activity:   . Days of Exercise per Week: Not on file  . Minutes of Exercise per Session: Not on file  Stress:   . Feeling of Stress : Not on file  Social Connections:   . Frequency of Communication with Friends and Family: Not on file  . Frequency of Social Gatherings with Friends and Family: Not on file  .  Attends Religious Services: Not on file  . Active Member of Clubs or Organizations: Not on file  . Attends Archivist Meetings: Not on file  . Marital Status: Not on file  Intimate Partner Violence:   . Fear of Current or Ex-Partner: Not on file  . Emotionally Abused: Not on file  . Physically Abused: Not on file  . Sexually Abused: Not on file      Family History  Problem Relation Age of Onset  . Heart disease Father   . Atrial fibrillation Brother     Vitals:   07/22/19 0924  BP: 140/82  Pulse: 63  SpO2: 98%  Weight: 128.1 kg (282 lb 6 oz)     PHYSICAL EXAM: General:  Super morbidly obese, WF. No respiratory difficulty HEENT: normal Neck: supple. Thick neck, JVD assessment difficut. Carotids 2+ bilat; no bruits. No lymphadenopathy or thyromegaly appreciated. Cor: PMI nondisplaced. Regular rate & rhythm. No rubs, gallops or murmurs. Lungs: clear Abdomen: obese, soft, nontender, nondistended. No hepatosplenomegaly. No bruits or masses. Good bowel sounds. Extremities: no cyanosis, clubbing, rash, 2+ bilateral LEE w/ chronic venous stasis dermatitis  Neuro: alert & oriented x 3, cranial nerves grossly intact. moves all 4 extremities w/o difficulty. Affect pleasant.    ASSESSMENT & PLAN:  1.Acute on Chronic Combined Systolic and Diastolic Heart Failure  - Echo 2013: EF 25-30%. LHC w/ normal cors=> NICM. EF recovered on repeat echo 07/2018, 60-65%.  - NYHA III. Volume status up by exam and by  optivol.   - Increase lasix to 80 mg bid x 3 days, then reduce down to 60 mg qam/40 mg qpm (increased dose). Increase KCl to 40 mEq daily. Check BMP today and again in 7 days. - Continue current dose of bisoprolol and spironolactone.  - Advised to reduce sodium intake   2. H/O VT Arrest - VT in 05/2017 and again 07/2018. Now w/ ICD.  - Denies any recent shocks.  - Continue amiodarone 200 mgdaily. Plan to check TSH and HFTs at next OV. - Check BMP today   3. PAF  - Continue amiodarone to 200 mg daily + bisoprolol 5 mg daily.   - Continue apixaban 5 mg twice a day.  F/u in 2-3 weeks to reassess volume status    Lyda Jester, PA-C 07/22/19

## 2019-07-25 ENCOUNTER — Telehealth (HOSPITAL_COMMUNITY): Payer: Self-pay

## 2019-07-25 NOTE — Telephone Encounter (Signed)
-----   Message from Allayne Butcher, [place] sent at 07/22/2019  4:03 PM EST ----- SCr stable. K low. Increase KCl to 40 meq daily. Repeat BMP in 1 week.

## 2019-07-25 NOTE — Telephone Encounter (Signed)
Called pt and reviewed results and new meds. Lab script faxed to pcp to draw.

## 2019-08-08 NOTE — Progress Notes (Signed)
No ICM remote transmission received for 08/04/2019 and next ICM transmission scheduled for 09/01/2019.   

## 2019-08-12 ENCOUNTER — Ambulatory Visit (HOSPITAL_COMMUNITY)
Admission: RE | Admit: 2019-08-12 | Discharge: 2019-08-12 | Disposition: A | Discharge status: Home / Self Care | Payer: Medicaid Other | Source: Ambulatory Visit | Attending: Adult Health | Admitting: Adult Health

## 2019-08-12 ENCOUNTER — Other Ambulatory Visit: Payer: Self-pay

## 2019-08-12 VITALS — BP 108/68 | HR 70 | Wt 287.1 lb

## 2019-08-12 DIAGNOSIS — Z95810 Presence of automatic (implantable) cardiac defibrillator: Secondary | ICD-10-CM | POA: Insufficient documentation

## 2019-08-12 DIAGNOSIS — I252 Old myocardial infarction: Secondary | ICD-10-CM | POA: Insufficient documentation

## 2019-08-12 DIAGNOSIS — I4901 Ventricular fibrillation: Secondary | ICD-10-CM | POA: Diagnosis not present

## 2019-08-12 DIAGNOSIS — Z6841 Body Mass Index (BMI) 40.0 and over, adult: Secondary | ICD-10-CM | POA: Insufficient documentation

## 2019-08-12 DIAGNOSIS — Z91041 Radiographic dye allergy status: Secondary | ICD-10-CM | POA: Diagnosis not present

## 2019-08-12 DIAGNOSIS — Z87891 Personal history of nicotine dependence: Secondary | ICD-10-CM | POA: Insufficient documentation

## 2019-08-12 DIAGNOSIS — J449 Chronic obstructive pulmonary disease, unspecified: Secondary | ICD-10-CM | POA: Diagnosis not present

## 2019-08-12 DIAGNOSIS — E039 Hypothyroidism, unspecified: Secondary | ICD-10-CM | POA: Diagnosis not present

## 2019-08-12 DIAGNOSIS — Z7901 Long term (current) use of anticoagulants: Secondary | ICD-10-CM | POA: Insufficient documentation

## 2019-08-12 DIAGNOSIS — Z8674 Personal history of sudden cardiac arrest: Secondary | ICD-10-CM | POA: Insufficient documentation

## 2019-08-12 DIAGNOSIS — Z79899 Other long term (current) drug therapy: Secondary | ICD-10-CM | POA: Diagnosis not present

## 2019-08-12 DIAGNOSIS — Z8249 Family history of ischemic heart disease and other diseases of the circulatory system: Secondary | ICD-10-CM | POA: Diagnosis not present

## 2019-08-12 DIAGNOSIS — I5032 Chronic diastolic (congestive) heart failure: Secondary | ICD-10-CM

## 2019-08-12 DIAGNOSIS — Z881 Allergy status to other antibiotic agents status: Secondary | ICD-10-CM | POA: Diagnosis not present

## 2019-08-12 DIAGNOSIS — Z91040 Latex allergy status: Secondary | ICD-10-CM | POA: Insufficient documentation

## 2019-08-12 DIAGNOSIS — M1990 Unspecified osteoarthritis, unspecified site: Secondary | ICD-10-CM | POA: Insufficient documentation

## 2019-08-12 DIAGNOSIS — I428 Other cardiomyopathies: Secondary | ICD-10-CM

## 2019-08-12 DIAGNOSIS — I480 Paroxysmal atrial fibrillation: Secondary | ICD-10-CM | POA: Diagnosis not present

## 2019-08-12 DIAGNOSIS — I110 Hypertensive heart disease with heart failure: Secondary | ICD-10-CM | POA: Insufficient documentation

## 2019-08-12 DIAGNOSIS — Z888 Allergy status to other drugs, medicaments and biological substances status: Secondary | ICD-10-CM | POA: Diagnosis not present

## 2019-08-12 DIAGNOSIS — I5043 Acute on chronic combined systolic (congestive) and diastolic (congestive) heart failure: Secondary | ICD-10-CM | POA: Diagnosis not present

## 2019-08-12 DIAGNOSIS — Z79890 Hormone replacement therapy (postmenopausal): Secondary | ICD-10-CM | POA: Diagnosis not present

## 2019-08-12 LAB — BASIC METABOLIC PANEL
Anion gap: 11 (ref 5–15)
BUN: 20 mg/dL (ref 8–23)
CO2: 26 mmol/L (ref 22–32)
Calcium: 9.3 mg/dL (ref 8.9–10.3)
Chloride: 105 mmol/L (ref 98–111)
Creatinine, Ser: 1.08 mg/dL — ABNORMAL HIGH (ref 0.44–1.00)
GFR calc Af Amer: >60 mL/min (ref >60)
GFR calc non Af Amer: 54 mL/min — ABNORMAL LOW (ref >60)
Glucose, Bld: 126 mg/dL — ABNORMAL HIGH (ref 70–99)
Potassium: 4 mmol/L (ref 3.5–5.1)
Sodium: 142 mmol/L (ref 135–145)

## 2019-08-12 MED ORDER — FUROSEMIDE 40 MG PO TABS: 80.0000 mg | ORAL_TABLET | Freq: Two times a day (BID) | ORAL | 3 refills | Status: DC

## 2019-08-12 NOTE — Patient Instructions (Signed)
INCREASE Lasix to 80 mg twice a daily  Labs today We will only contact you if something comes back abnormal or we need to make some changes. Otherwise no news is good news!  Your physician recommends that you schedule a follow-up appointment in: 3-4 weeks  in the Advanced Practitioners (PA/NP) Clinic   Your physician recommends that you schedule a follow-up appointment in: 8 weeks with the CHF pharmacist  Do the following things EVERYDAY: 1) Weigh yourself in the morning before breakfast. Write it down and keep it in a log. 2) Take your medicines as prescribed 3) Eat low salt foods-Limit salt (sodium) to 2000 mg per day.  4) Stay as active as you can everyday 5) Limit all fluids for the day to less than 2 liters  At the Advanced Heart Failure Clinic, you and your health needs are our priority. As part of our continuing mission to provide you with exceptional heart care, we have created designated Provider Care Teams. These Care Teams include your primary Cardiologist (physician) and Advanced Practice Providers (APPs- Physician Assistants and Nurse Practitioners) who all work together to provide you with the care you need, when you need it.   You may see any of the following providers on your designated Care Team at your next follow up: Marland Kitchen Dr Arvilla Meres . Dr Marca Ancona . [name], NP . Robbie Lis, PA . Karle Plumber, PharmD   Please be sure to bring in all your medications bottles to every appointment.

## 2019-08-12 NOTE — Progress Notes (Addendum)
Advanced Heart Failure Clinic Note   Referring Physician: PCP: [place], Sarah T, MD PCP-Cardiologist: Jenean Lindau, MD  AHF: Dr. Aundra Dubin  EP: Dr.  Rayann Heman   HPI: Carrie Walters is a 64 y.o. female with a history of chronic atrial fibrillation, hypothyroidism, COPD, HTN, morbid obesity,asthma,VF arrest 23/7/62, and systolic heart failure due to NICM (diagnosed 02/2018). Echo in 2019 showed reduced LVEF 25-30%. LHC at time of diagnosis showed normal coronaries. She was sent home w/ a LifeVest.  Readmitted 3/3 through 3/6/20after LifeVest shock noted to be VT on interrogation. Loaded on IV amiodarone and transitioned to PO amiodarone. She was also treated for LLE cellulitis. She was continued on LifeVest at discharge and later underwent ICD on 11/05/18. This is now followed by Dr. Rayann Heman.     She remains on amiodarone and is on Eliquis for a/c given afib. She had recent remote device interrogation 2/15 showing normal device function, stable leads/batter status and appropriate histograms. Optivol however elevated and she was referred back to [facility] for evaluation.   Of note, she had a repeat limited echo 07/25/18 that showed normalization of her EF, up to 60-65%.   Today she returns for HF follow up.Last visit she was volume overloaded so lasix was increased to 80 mg/80 mg and then cut back to 60 /40 mg. Unfortunately she continued lasix 40 mg twice a day. Overall feeling fine. Remains SOB with exertion. Denies PND/Orthopnea. No bleeding issues. Denies chest pain.  Appetite ok. No fever or chills.  Not weighing at home. Taking all medications but says she was out of amiodarone for a week. She has trouble getting  transportation to get medications. Lives with her daughter and boyfriend.   [employer]: No VT/Afib. Impedance trending up. Fluid index well above threshold. Activity ~1 hour per day.   Past Medical History:  Diagnosis Date  . Anxiety   . Arthritis   . Asthma   . Atrial fibrillation  ([place])   . Cellulitis   . Dysrhythmia   . Hypertension   . Hypothyroidism   . MI (myocardial infarction) [facility])     Current Outpatient Medications  Medication Sig Dispense Refill  . acetaminophen (TYLENOL) 500 MG tablet Take 500-1,000 mg by mouth every 8 (eight) hours as needed for mild pain or headache.     . albuterol (PROVENTIL HFA;VENTOLIN HFA) 108 (90 Base) MCG/ACT inhaler Inhale 2 puffs into the lungs every 6 (six) hours as needed for wheezing or shortness of breath.    Marland Kitchen amiodarone (PACERONE) 200 MG tablet Take 1 tablet (200 mg total) by mouth at bedtime. 30 tablet 6  . apixaban (ELIQUIS) 5 MG TABS tablet Take 1 tablet (5 mg total) by mouth 2 (two) times daily. Restart 6/19 pm dose 60 tablet 11  . bisoprolol (ZEBETA) 5 MG tablet Take 1 tablet (5 mg total) by mouth daily. 30 tablet 5  . furosemide (LASIX) 40 MG tablet Take 40 mg by mouth 2 (two) times daily.    Marland Kitchen ipratropium-albuterol (DUONEB) 0.5-2.5 (3) MG/3ML SOLN Take 3 mLs by nebulization every 6 (six) hours as needed (SOB, Wheeze or cough). 360 mL 1  . levothyroxine (SYNTHROID, LEVOTHROID) 50 MCG tablet Take 1 tablet (50 mcg total) by mouth daily before breakfast. 30 tablet 6  . montelukast (SINGULAIR) 10 MG tablet Take 10 mg by mouth at bedtime.    . pantoprazole (PROTONIX) 40 MG tablet Take 40 mg by mouth daily at 12 noon.    . potassium chloride SA (KLOR-CON M20)  20 MEQ tablet Take 2 tablets (40 mEq total) by mouth daily. 180 tablet 1  . spironolactone (ALDACTONE) 25 MG tablet Take 1 tablet (25 mg total) by mouth daily. 30 tablet 6  . Tiotropium Bromide-Olodaterol (STIOLTO RESPIMAT) 2.5-2.5 MCG/ACT AERS Inhale 2 puffs into the lungs daily. 4 g 1   No current facility-administered medications for this encounter.    Allergies  Allergen Reactions  . Metoprolol Tartrate Shortness Of Breath  . Biaxin [Clarithromycin] Other (See Comments)    Sores in mouth  . Levaquin [Levofloxacin] Other (See Comments)    Sores in mouth  .  Adhesive [Tape] Rash    Reaction to ekg pads  . Contrast Media [Iodinated Diagnostic Agents] Rash  . Latex Itching and Dermatitis      Social History   Socioeconomic History  . Marital status: Single    Spouse name: Not on file  . Number of children: 1  . Years of education: Not on file  . Highest education level: 12th grade  Occupational History  . Not on file  Tobacco Use  . Smoking status: Former Games developer  . Smokeless tobacco: Never Used  Substance and Sexual Activity  . Alcohol use: Never  . Drug use: Never  . Sexual activity: Not on file  Other Topics Concern  . Not on file  Social History Narrative   Moved from [place]   Social Determinants of Health   Financial Resource Strain:   . Difficulty of Paying Living Expenses:   Food Insecurity:   . Worried About Programme researcher, broadcasting/film/video in the Last Year:   . Barista in the Last Year:   Transportation Needs:   . Freight forwarder (Medical):   Marland Kitchen Lack of Transportation (Non-Medical):   Physical Activity:   . Days of Exercise per Week:   . Minutes of Exercise per Session:   Stress:   . Feeling of Stress :   Social Connections:   . Frequency of Communication with Friends and Family:   . Frequency of Social Gatherings with Friends and Family:   . Attends Religious Services:   . Active Member of Clubs or Organizations:   . Attends Banker Meetings:   Marland Kitchen Marital Status:   Intimate Partner Violence:   . Fear of Current or Ex-Partner:   . Emotionally Abused:   Marland Kitchen Physically Abused:   . Sexually Abused:       Family History  Problem Relation Age of Onset  . Heart disease Father   . Atrial fibrillation Brother     Vitals:   08/12/19 1019  BP: 108/68  Pulse: 70  SpO2: 95%  Weight: 130.2 kg (287 lb 2 oz)   Wt Readings from Last 3 Encounters:  08/12/19 130.2 kg (287 lb 2 oz)  07/22/19 128.1 kg (282 lb 6 oz)  07/10/19 115.7 kg (255 lb)    PHYSICAL EXAM: General:  Walked in the clinic.  No resp difficulty HEENT: normal Neck: supple. no JVD. Carotids 2+ bilat; no bruits. No lymphadenopathy or thryomegaly appreciated. Cor: PMI nondisplaced. Regular rate & rhythm. No rubs, gallops or murmurs. Lungs: clear Abdomen: obese, soft, nontender, nondistended. No hepatosplenomegaly. No bruits or masses. Good bowel sounds. Extremities: no cyanosis, clubbing, rash, edema Neuro: alert & orientedx3, cranial nerves grossly intact. moves all 4 extremities w/o difficulty. Affect pleasant   ASSESSMENT & PLAN:  1.Acute on Chronic Combined Systolic and Diastolic Heart Failure  - Echo 2013: EF 25-30%. LHC w/ normal  cors=> NICM. EF recovered on repeat echo 07/2018, 60-65%.  - NYHA III. Volume status elevated suspect because of her diet and the fact she did not increase lasix after the last visit. Increase lasix to 80 mg twice a day. Continue KCl to 40 mEq daily. - Continue current dose of bisoprolol and spironolactone.  - Check BMET  2. H/O VT Arrest - VT in 05/2017 and again 07/2018. Now w/ ICD.  - No recent ICD shocks.  - Continue amiodarone 200 mgdaily. Plan to check TSH and HFTs at next OV. 3. PAF  - Continue amiodarone to 200 mg daily + bisoprolol 5 mg daily.   - Continue apixaban 5 mg twice a day. 4. Obesity Body mass index is 46.34 kg/m. Discussed portion control and to increase activity as tolerated.  Follow up in 3-4 weeks APP. Follow 8 weeks with Dr Shirlee Latch.   [name], NP 08/12/19

## 2019-08-12 NOTE — Addendum Note (Signed)
Encounter addended by: Sherald Hess, NP on: 08/12/2019 5:03 PM  Actions taken: Clinical Note Signed

## 2019-08-19 ENCOUNTER — Other Ambulatory Visit: Payer: Self-pay

## 2019-09-04 ENCOUNTER — Encounter (HOSPITAL_COMMUNITY): Payer: Medicaid Other

## 2019-09-08 NOTE — Progress Notes (Signed)
No ICM remote transmission received for 09/01/2019 and next ICM transmission scheduled for 09/22/2019.

## 2019-09-17 ENCOUNTER — Other Ambulatory Visit (HOSPITAL_COMMUNITY): Payer: Self-pay | Admitting: Adult Health

## 2019-09-23 ENCOUNTER — Encounter (HOSPITAL_COMMUNITY): Payer: Self-pay

## 2019-09-23 ENCOUNTER — Other Ambulatory Visit: Payer: Self-pay | Admitting: Gastroenterology

## 2019-09-23 ENCOUNTER — Ambulatory Visit (HOSPITAL_COMMUNITY)
Admission: RE | Admit: 2019-09-23 | Discharge: 2019-09-23 | Disposition: A | Discharge status: Home / Self Care | Payer: Medicaid Other | Source: Ambulatory Visit | Attending: Internal Medicine | Admitting: Internal Medicine

## 2019-09-23 ENCOUNTER — Other Ambulatory Visit: Payer: Self-pay

## 2019-09-23 VITALS — BP 118/74 | HR 70 | Wt 298.2 lb

## 2019-09-23 DIAGNOSIS — M1990 Unspecified osteoarthritis, unspecified site: Secondary | ICD-10-CM | POA: Insufficient documentation

## 2019-09-23 DIAGNOSIS — Z7901 Long term (current) use of anticoagulants: Secondary | ICD-10-CM | POA: Insufficient documentation

## 2019-09-23 DIAGNOSIS — I480 Paroxysmal atrial fibrillation: Secondary | ICD-10-CM | POA: Diagnosis not present

## 2019-09-23 DIAGNOSIS — Z881 Allergy status to other antibiotic agents status: Secondary | ICD-10-CM | POA: Diagnosis not present

## 2019-09-23 DIAGNOSIS — Z91041 Radiographic dye allergy status: Secondary | ICD-10-CM | POA: Diagnosis not present

## 2019-09-23 DIAGNOSIS — J449 Chronic obstructive pulmonary disease, unspecified: Secondary | ICD-10-CM | POA: Insufficient documentation

## 2019-09-23 DIAGNOSIS — I252 Old myocardial infarction: Secondary | ICD-10-CM | POA: Insufficient documentation

## 2019-09-23 DIAGNOSIS — I5042 Chronic combined systolic (congestive) and diastolic (congestive) heart failure: Secondary | ICD-10-CM | POA: Diagnosis not present

## 2019-09-23 DIAGNOSIS — I428 Other cardiomyopathies: Secondary | ICD-10-CM | POA: Diagnosis not present

## 2019-09-23 DIAGNOSIS — Z6841 Body Mass Index (BMI) 40.0 and over, adult: Secondary | ICD-10-CM | POA: Diagnosis not present

## 2019-09-23 DIAGNOSIS — I5032 Chronic diastolic (congestive) heart failure: Secondary | ICD-10-CM | POA: Diagnosis present

## 2019-09-23 DIAGNOSIS — Z95810 Presence of automatic (implantable) cardiac defibrillator: Secondary | ICD-10-CM | POA: Diagnosis not present

## 2019-09-23 DIAGNOSIS — I110 Hypertensive heart disease with heart failure: Secondary | ICD-10-CM | POA: Diagnosis not present

## 2019-09-23 DIAGNOSIS — Z79899 Other long term (current) drug therapy: Secondary | ICD-10-CM | POA: Insufficient documentation

## 2019-09-23 DIAGNOSIS — K862 Cyst of pancreas: Secondary | ICD-10-CM

## 2019-09-23 DIAGNOSIS — Z8674 Personal history of sudden cardiac arrest: Secondary | ICD-10-CM | POA: Diagnosis not present

## 2019-09-23 DIAGNOSIS — Z87891 Personal history of nicotine dependence: Secondary | ICD-10-CM | POA: Insufficient documentation

## 2019-09-23 DIAGNOSIS — Z888 Allergy status to other drugs, medicaments and biological substances status: Secondary | ICD-10-CM | POA: Diagnosis not present

## 2019-09-23 DIAGNOSIS — Z91040 Latex allergy status: Secondary | ICD-10-CM | POA: Diagnosis not present

## 2019-09-23 DIAGNOSIS — Z79890 Hormone replacement therapy (postmenopausal): Secondary | ICD-10-CM | POA: Insufficient documentation

## 2019-09-23 DIAGNOSIS — E039 Hypothyroidism, unspecified: Secondary | ICD-10-CM | POA: Diagnosis not present

## 2019-09-23 DIAGNOSIS — Z8249 Family history of ischemic heart disease and other diseases of the circulatory system: Secondary | ICD-10-CM | POA: Insufficient documentation

## 2019-09-23 DIAGNOSIS — I4901 Ventricular fibrillation: Secondary | ICD-10-CM

## 2019-09-23 LAB — COMPREHENSIVE METABOLIC PANEL
ALT: 13 U/L (ref 0–44)
AST: 12 U/L — ABNORMAL LOW (ref 15–41)
Albumin: 3.9 g/dL (ref 3.5–5.0)
Alkaline Phosphatase: 80 U/L (ref 38–126)
Anion gap: 10 (ref 5–15)
BUN: 26 mg/dL — ABNORMAL HIGH (ref 8–23)
CO2: 27 mmol/L (ref 22–32)
Calcium: 9.5 mg/dL (ref 8.9–10.3)
Chloride: 101 mmol/L (ref 98–111)
Creatinine, Ser: 1.12 mg/dL — ABNORMAL HIGH (ref 0.44–1.00)
GFR calc Af Amer: >60 mL/min (ref >60)
GFR calc non Af Amer: 52 mL/min — ABNORMAL LOW (ref >60)
Glucose, Bld: 135 mg/dL — ABNORMAL HIGH (ref 70–99)
Potassium: 4.6 mmol/L (ref 3.5–5.1)
Sodium: 138 mmol/L (ref 135–145)
Total Bilirubin: 0.6 mg/dL (ref 0.3–1.2)
Total Protein: 7.1 g/dL (ref 6.5–8.1)

## 2019-09-23 LAB — TSH: TSH: 20.152 u[IU]/mL — ABNORMAL HIGH (ref 0.350–4.500)

## 2019-09-23 MED ORDER — AMIODARONE HCL 200 MG PO TABS: 200.0000 mg | ORAL_TABLET | Freq: Every day | ORAL | 3 refills | Status: DC

## 2019-09-23 NOTE — Progress Notes (Signed)
Advanced [facility] Failure Clinic Note   Referring Physician: PCP: [place], Sarah T, MD PCP-Cardiologist: Garwin Brothers, MD  AHF: Dr. Shirlee Latch  EP: Dr.  Johney Frame   HPI: Carrie Walters is a 64 y.o. female with a history of chronic atrial fibrillation, hypothyroidism, COPD, HTN, morbid obesity,asthma,VF arrest 02/21/18, and systolic heart failure due to NICM (diagnosed 02/2018). Echo in 2019 showed reduced LVEF 25-30%. LHC at time of diagnosis showed normal coronaries. She was sent home w/ a LifeVest.  Readmitted 3/3 through 3/6/20after LifeVest shock noted to be VT on interrogation. Loaded on IV amiodarone and transitioned to PO amiodarone. She was also treated for LLE cellulitis. She was continued on LifeVest at discharge and later underwent ICD on 11/05/18. This is now followed by Dr. Johney Frame.     She remains on amiodarone and is on Eliquis for a/c given afib. She had recent remote device interrogation 2/15 showing normal device function, stable leads/batter status and appropriate histograms. Optivol however elevated and she was referred back to [facility] for evaluation.   Of note, she had a repeat limited echo 07/25/18 that showed normalization of her EF, up to 60-65%.   Today she returns for HF follow up.Overall feeling ok but complaining of arthritis pain in left arm. Has pain when she moves left arm up. Lateral movement makes left arm hurt. Remains SOB with exertion but says this is her baseline.   Denies PND/Orthopnea. Appetite ok. No fever or chills. She is not weighing at home. Followed by [facility] for LLE wounds. Taking all medications and has resumed amiodarone. Lives with her daughter and boyfriend.   [employer]: Impedance up. Fluid index down. No VT. Activity ~ 1 hour per day.   Past Medical History:  Diagnosis Date  . Anxiety   . Arthritis   . Asthma   . Atrial fibrillation (HCC)   . Cellulitis   . Dysrhythmia   . Hypertension   . Hypothyroidism   . MI (myocardial infarction) [facility])      Current Outpatient Medications  Medication Sig Dispense Refill  . acetaminophen (TYLENOL) 500 MG tablet Take 500-1,000 mg by mouth every 8 (eight) hours as needed for mild pain or headache.     . albuterol (PROVENTIL HFA;VENTOLIN HFA) 108 (90 Base) MCG/ACT inhaler Inhale 2 puffs into the lungs every 6 (six) hours as needed for wheezing or shortness of breath.    Marland Kitchen amiodarone (PACERONE) 200 MG tablet TAKE 1 TABLET BY MOUTH EVERY DAY 90 tablet 3  . apixaban (ELIQUIS) 5 MG TABS tablet Take 1 tablet (5 mg total) by mouth 2 (two) times daily. Restart 6/19 pm dose 60 tablet 11  . bisoprolol (ZEBETA) 5 MG tablet Take 1 tablet (5 mg total) by mouth daily. 30 tablet 5  . furosemide (LASIX) 40 MG tablet Take 80 mg by mouth. 80 mg in the am 40 mg in the pm    . ipratropium-albuterol (DUONEB) 0.5-2.5 (3) MG/3ML SOLN Take 3 mLs by nebulization every 6 (six) hours as needed (SOB, Wheeze or cough). 360 mL 1  . levothyroxine (SYNTHROID, LEVOTHROID) 50 MCG tablet Take 1 tablet (50 mcg total) by mouth daily before breakfast. 30 tablet 6  . montelukast (SINGULAIR) 10 MG tablet Take 10 mg by mouth at bedtime.    . pantoprazole (PROTONIX) 40 MG tablet Take 40 mg by mouth daily at 12 noon.    . potassium chloride SA (KLOR-CON M20) 20 MEQ tablet Take 2 tablets (40 mEq total) by mouth daily. 180 tablet 1  .  spironolactone (ALDACTONE) 25 MG tablet TAKE 1 TABLET BY MOUTH EVERY DAY 90 tablet 3  . Tiotropium Bromide-Olodaterol (STIOLTO RESPIMAT) 2.5-2.5 MCG/ACT AERS Inhale 2 puffs into the lungs daily. 4 g 1   No current facility-administered medications for this encounter.    Allergies  Allergen Reactions  . Metoprolol Tartrate Shortness Of Breath  . Biaxin [Clarithromycin] Other (See Comments)    Sores in mouth  . Levaquin [Levofloxacin] Other (See Comments)    Sores in mouth  . Adhesive [Tape] Rash    Reaction to ekg pads  . Contrast Media [Iodinated Diagnostic Agents] Rash  . Latex Itching and Dermatitis       Social History   Socioeconomic History  . Marital status: Single    Spouse name: Not on file  . Number of children: 1  . Years of education: Not on file  . Highest education level: 12th grade  Occupational History  . Not on file  Tobacco Use  . Smoking status: Former Research scientist (life sciences)  . Smokeless tobacco: Never Used  Substance and Sexual Activity  . Alcohol use: Never  . Drug use: Never  . Sexual activity: Not on file  Other Topics Concern  . Not on file  Social History Narrative   Moved from [place]   Social Determinants of Health   Financial Resource Strain:   . Difficulty of Paying Living Expenses:   Food Insecurity:   . Worried About Charity fundraiser in the Last Year:   . Arboriculturist in the Last Year:   Transportation Needs:   . Film/video editor (Medical):   Marland Kitchen Lack of Transportation (Non-Medical):   Physical Activity:   . Days of Exercise per Week:   . Minutes of Exercise per Session:   Stress:   . Feeling of Stress :   Social Connections:   . Frequency of Communication with Friends and Family:   . Frequency of Social Gatherings with Friends and Family:   . Attends Religious Services:   . Active Member of Clubs or Organizations:   . Attends Archivist Meetings:   Marland Kitchen Marital Status:   Intimate Partner Violence:   . Fear of Current or Ex-Partner:   . Emotionally Abused:   Marland Kitchen Physically Abused:   . Sexually Abused:       Family History  Problem Relation Age of Onset  . Heart disease Father   . Atrial fibrillation Brother     Vitals:   09/23/19 0946  BP: 118/74  Pulse: 70  SpO2: 96%  Weight: 135.3 kg (298 lb 3.2 oz)   Wt Readings from Last 3 Encounters:  09/23/19 135.3 kg (298 lb 3.2 oz)  08/12/19 130.2 kg (287 lb 2 oz)  07/22/19 128.1 kg (282 lb 6 oz)    PHYSICAL EXAM: General:  Walked in the clinic. No resp difficulty HEENT: normal Neck: supple. no JVD. Carotids 2+ bilat; no bruits. No lymphadenopathy or thryomegaly  appreciated. Cor: PMI nondisplaced. Regular rate & rhythm. No rubs, gallops or murmurs. Lungs: clear Abdomen: soft, nontender, nondistended. No hepatosplenomegaly. No bruits or masses. Good bowel sounds. Extremities: no cyanosis, clubbing, rash, LLE leg wrap. R and LLE trace-1+ edema Neuro: alert & orientedx3, cranial nerves grossly intact. moves all 4 extremities w/o difficulty. Affect pleasant    ASSESSMENT & PLAN:  1.Chronic Combined Systolic and Diastolic Heart Failure  - Echo 2013: EF 25-30%. LHC w/ normal cors=> NICM. EF recovered on repeat echo 07/2018, 60-65%.  - Repeat  ECHO  - Discussed optivol interrogation.  NYHA III. Volume status stable. Continue lasix to 80 mg/40 mg a day.  - Continue current dose of bisoprolol and spironolactone.  - Check CMET today.  2. H/O VT Arrest - VT in 05/2017 and again 07/2018. Now w/ ICD.  - No recent ICD shocks. No VT on interrogation.  - Continue amiodarone 200 mgdaily.  - Check TSH/LFTs today.   3. PAF  - Regular on exam.  - Continue amiodarone to 200 mg daily + bisoprolol 5 mg daily.   - Continue apixaban 5 mg twice a day. 4. Obesity Body mass index is 48.13 kg/m. Discussed portion control.   Follow up 3 months with Dr Shirlee Latch and an ECHO. Check CMET/TSH today.  [name], NP 09/23/19

## 2019-09-23 NOTE — Patient Instructions (Signed)
It was great to see you today! No medication changes are needed at this time.  Labs today We will only contact you if something comes back abnormal or we need to make some changes. Otherwise no news is good news!  Your physician recommends that you schedule a follow-up appointment in: 3 months with Dr Shirlee Latch and echo  Your physician has requested that you have an echocardiogram. Echocardiography is a painless test that uses sound waves to create images of your heart. It provides your doctor with information about the size and shape of your heart and how well your heart's chambers and valves are working. This procedure takes approximately one hour. There are no restrictions for this procedure.   Do the following things EVERYDAY: 1) Weigh yourself in the morning before breakfast. Write it down and keep it in a log. 2) Take your medicines as prescribed 3) Eat low salt foods--Limit salt (sodium) to 2000 mg per day.  4) Stay as active as you can everyday 5) Limit all fluids for the day to less than 2 liters  At the [facility], you and your health needs are our priority. As part of our continuing mission to provide you with exceptional heart care, we have created designated Provider Care Teams. These Care Teams include your primary Cardiologist (physician) and Advanced Practice Providers (APPs- Physician Assistants and Nurse Practitioners) who all work together to provide you with the care you need, when you need it.   You may see any of the following providers on your designated Care Team at your next follow up: Marland Kitchen Dr Arvilla Meres . Dr Marca Ancona . [name], NP . Robbie Lis, PA . Karle Plumber, PharmD   Please be sure to bring in all your medications bottles to every appointment.

## 2019-09-24 ENCOUNTER — Other Ambulatory Visit (HOSPITAL_COMMUNITY): Payer: Self-pay

## 2019-09-24 DIAGNOSIS — I5032 Chronic diastolic (congestive) heart failure: Secondary | ICD-10-CM

## 2019-09-24 NOTE — Progress Notes (Signed)
Orders Placed This Encounter  Procedures  . ECHOCARDIOGRAM COMPLETE    Standing Status:   Future    Standing Expiration Date:   12/24/2020    Order Specific Question:   Where should this test be performed    Answer:   [facility]    Order Specific Question:   Perflutren DEFINITY (image enhancing agent) should be administered unless hypersensitivity or allergy exist    Answer:   Administer Perflutren    Order Specific Question:   Reason for exam-Echo    Answer:   Congestive Heart Failure  428.0 / I50.9    Order Specific Question:   Release to patient    Answer:   Immediate    

## 2019-09-25 ENCOUNTER — Telehealth (HOSPITAL_COMMUNITY): Payer: Self-pay | Admitting: *Deleted

## 2019-09-25 NOTE — Telephone Encounter (Signed)
Carrie Walters with [facility] left VM stating pts weight is 297lbs. PTs weight is the same from office visit on 5/4 but last week during home visit pts weight was 284lbs. Pt had a 13lb weight gain in a week. [name] wants to know if there needed to be any other changes made.  Routed to [name], NP

## 2019-09-25 NOTE — Telephone Encounter (Signed)
Take extra 40 mg lasix for 2 days .   Please call.   Senai Ramnath NP-C  4:19 PM

## 2019-09-26 NOTE — Telephone Encounter (Signed)
Called Stacy at [phone] left detailed message and requested she return my call.

## 2019-09-26 NOTE — Telephone Encounter (Signed)
Advised pt to add extra 40mg  x2 days. Pt verbalized understanding.  Called RN and let her know of same.

## 2019-10-03 ENCOUNTER — Telehealth: Payer: Self-pay

## 2019-10-03 NOTE — Telephone Encounter (Signed)
Spoke with patient to remind of missed remote transmission 

## 2019-10-06 ENCOUNTER — Inpatient Hospital Stay (HOSPITAL_COMMUNITY)
Admission: RE | Admit: 2019-10-06 | Discharge: 2019-10-06 | Disposition: A | Discharge status: Home / Self Care | Payer: Medicaid Other | Source: Ambulatory Visit

## 2019-10-08 ENCOUNTER — Other Ambulatory Visit: Payer: Medicaid Other

## 2019-10-10 NOTE — Progress Notes (Signed)
Unable to reach patient for ICM monthly follow since referral call 06/2019.  Patient disenrolled since unable to reach or receive scheduled ICM remote transmissions.  Device clinic will continue with remote monitoring every 91 days per protocol.

## 2019-10-13 ENCOUNTER — Ambulatory Visit (INDEPENDENT_AMBULATORY_CARE_PROVIDER_SITE_OTHER): Payer: Medicaid Other | Admitting: *Deleted

## 2019-10-13 DIAGNOSIS — I4901 Ventricular fibrillation: Secondary | ICD-10-CM

## 2019-10-13 LAB — CUP PACEART REMOTE DEVICE CHECK
Battery Remaining Longevity: 122 mo
Battery Voltage: 3.02 V
Brady Statistic AP VP Percent: 0 %
Brady Statistic AP VS Percent: 4.95 %
Brady Statistic AS VP Percent: 0.03 %
Brady Statistic AS VS Percent: 95.02 %
Brady Statistic RA Percent Paced: 4.95 %
Brady Statistic RV Percent Paced: 0.03 %
Date Time Interrogation Session: 20210524192145
HighPow Impedance: 73 Ohm
Implantable Lead Implant Date: 20200616
Implantable Lead Implant Date: 20200616
Implantable Lead Location: 753859
Implantable Lead Location: 753860
Implantable Lead Model: 5076
Implantable Pulse Generator Implant Date: 20200616
Lead Channel Impedance Value: 380 Ohm
Lead Channel Impedance Value: 494 Ohm
Lead Channel Impedance Value: 494 Ohm
Lead Channel Pacing Threshold Amplitude: 0.75 V
Lead Channel Pacing Threshold Amplitude: 0.875 V
Lead Channel Pacing Threshold Pulse Width: 0.4 ms
Lead Channel Pacing Threshold Pulse Width: 0.4 ms
Lead Channel Sensing Intrinsic Amplitude: 3.75 mV
Lead Channel Sensing Intrinsic Amplitude: 3.75 mV
Lead Channel Sensing Intrinsic Amplitude: 9.375 mV
Lead Channel Sensing Intrinsic Amplitude: 9.375 mV
Lead Channel Setting Pacing Amplitude: 1.75 V
Lead Channel Setting Pacing Amplitude: 2 V
Lead Channel Setting Pacing Pulse Width: 0.4 ms
Lead Channel Setting Sensing Sensitivity: 0.3 mV

## 2019-10-14 NOTE — Progress Notes (Signed)
Remote ICD transmission.   

## 2019-10-24 ENCOUNTER — Telehealth: Payer: Self-pay | Admitting: Nurse Practitioner

## 2019-10-24 NOTE — Telephone Encounter (Signed)
Phone call to patient to review instructions for 13 hr prep for CT w/ contrast on 10/28/19 at 1100. Prescription called into [facility]. Pt aware and verbalized understanding of instructions. Prescription: 2200- 50mg  Prednisone 0400- 50mg  Prednisone 1000- 50mg  Prednisone and 50mg  Benadryl

## 2019-10-27 ENCOUNTER — Telehealth: Payer: Self-pay

## 2019-10-27 NOTE — Telephone Encounter (Signed)
Called patient back to go over new 13hr prep times since her CT scan has been rescheduled.  Prednisone 50mg  PO 11/10/19 @ 2240, 11/11/19 @ 0440 and 1040.  Benadryl 50mg  PO 11/11/19 @ 1040.

## 2019-10-28 ENCOUNTER — Inpatient Hospital Stay: Admission: RE | Admit: 2019-10-28 | Payer: Medicaid Other | Source: Ambulatory Visit

## 2019-11-11 ENCOUNTER — Inpatient Hospital Stay: Admission: RE | Admit: 2019-11-11 | Payer: Medicaid Other | Source: Ambulatory Visit

## 2019-11-19 ENCOUNTER — Other Ambulatory Visit: Payer: Self-pay

## 2019-11-20 ENCOUNTER — Other Ambulatory Visit: Payer: Self-pay | Admitting: Nurse Practitioner

## 2019-11-20 NOTE — Telephone Encounter (Signed)
Eliquis 5mg  refill request received. Patient is 64 years old, weight-135.3kg, Crea-1.12 on 09/23/2019, Diagnosis-Afib, and last seen by 11/23/2019 at HF Clinic on 09/23/2019. Dose is appropriate based on dosing criteria. Will send in refill to requested pharmacy.

## 2019-11-25 ENCOUNTER — Inpatient Hospital Stay: Admission: RE | Admit: 2019-11-25 | Payer: Medicaid Other | Source: Ambulatory Visit

## 2019-12-16 ENCOUNTER — Inpatient Hospital Stay: Admission: RE | Admit: 2019-12-16 | Payer: Medicaid Other | Source: Ambulatory Visit

## 2019-12-30 ENCOUNTER — Inpatient Hospital Stay: Admission: RE | Admit: 2019-12-30 | Payer: Medicaid Other | Source: Ambulatory Visit

## 2020-01-12 ENCOUNTER — Ambulatory Visit (HOSPITAL_COMMUNITY): Admission: RE | Admit: 2020-01-12 | Payer: Medicaid Other | Source: Ambulatory Visit

## 2020-01-12 ENCOUNTER — Encounter (HOSPITAL_COMMUNITY): Payer: Medicaid Other | Admitting: Cardiology

## 2020-01-13 ENCOUNTER — Inpatient Hospital Stay: Admission: RE | Admit: 2020-01-13 | Payer: Medicaid Other | Source: Ambulatory Visit

## 2020-01-20 ENCOUNTER — Encounter (HOSPITAL_COMMUNITY): Payer: Self-pay

## 2020-01-20 ENCOUNTER — Other Ambulatory Visit: Payer: Medicaid Other

## 2020-01-21 ENCOUNTER — Ambulatory Visit (INDEPENDENT_AMBULATORY_CARE_PROVIDER_SITE_OTHER): Payer: Medicaid Other | Admitting: *Deleted

## 2020-01-21 DIAGNOSIS — I4901 Ventricular fibrillation: Secondary | ICD-10-CM | POA: Diagnosis not present

## 2020-01-21 LAB — CUP PACEART REMOTE DEVICE CHECK
Battery Remaining Longevity: 119 mo
Battery Voltage: 3.02 V
Brady Statistic AP VP Percent: 0.01 %
Brady Statistic AP VS Percent: 7.03 %
Brady Statistic AS VP Percent: 0.03 %
Brady Statistic AS VS Percent: 92.94 %
Brady Statistic RA Percent Paced: 7.02 %
Brady Statistic RV Percent Paced: 0.03 %
Date Time Interrogation Session: 20210901135948
HighPow Impedance: 85 Ohm
Implantable Lead Implant Date: 20200616
Implantable Lead Implant Date: 20200616
Implantable Lead Location: 753859
Implantable Lead Location: 753860
Implantable Lead Model: 5076
Implantable Pulse Generator Implant Date: 20200616
Lead Channel Impedance Value: 380 Ohm
Lead Channel Impedance Value: 494 Ohm
Lead Channel Impedance Value: 494 Ohm
Lead Channel Pacing Threshold Amplitude: 0.75 V
Lead Channel Pacing Threshold Amplitude: 0.75 V
Lead Channel Pacing Threshold Pulse Width: 0.4 ms
Lead Channel Pacing Threshold Pulse Width: 0.4 ms
Lead Channel Sensing Intrinsic Amplitude: 2.375 mV
Lead Channel Sensing Intrinsic Amplitude: 2.375 mV
Lead Channel Sensing Intrinsic Amplitude: 9.125 mV
Lead Channel Sensing Intrinsic Amplitude: 9.125 mV
Lead Channel Setting Pacing Amplitude: 1.75 V
Lead Channel Setting Pacing Amplitude: 2 V
Lead Channel Setting Pacing Pulse Width: 0.4 ms
Lead Channel Setting Sensing Sensitivity: 0.3 mV

## 2020-01-23 NOTE — Progress Notes (Signed)
Remote ICD transmission.   

## 2020-02-19 IMAGING — CR PORTABLE CHEST - 1 VIEW
1 series · 1 of 1 positions shown · non-contrast
Comparison: Two-view chest x-ray 10/25/2018

CLINICAL DATA: Intermittent chest pressure for several weeks. Pain
is worse today.

EXAM:
PORTABLE CHEST 1 VIEW

[AP]
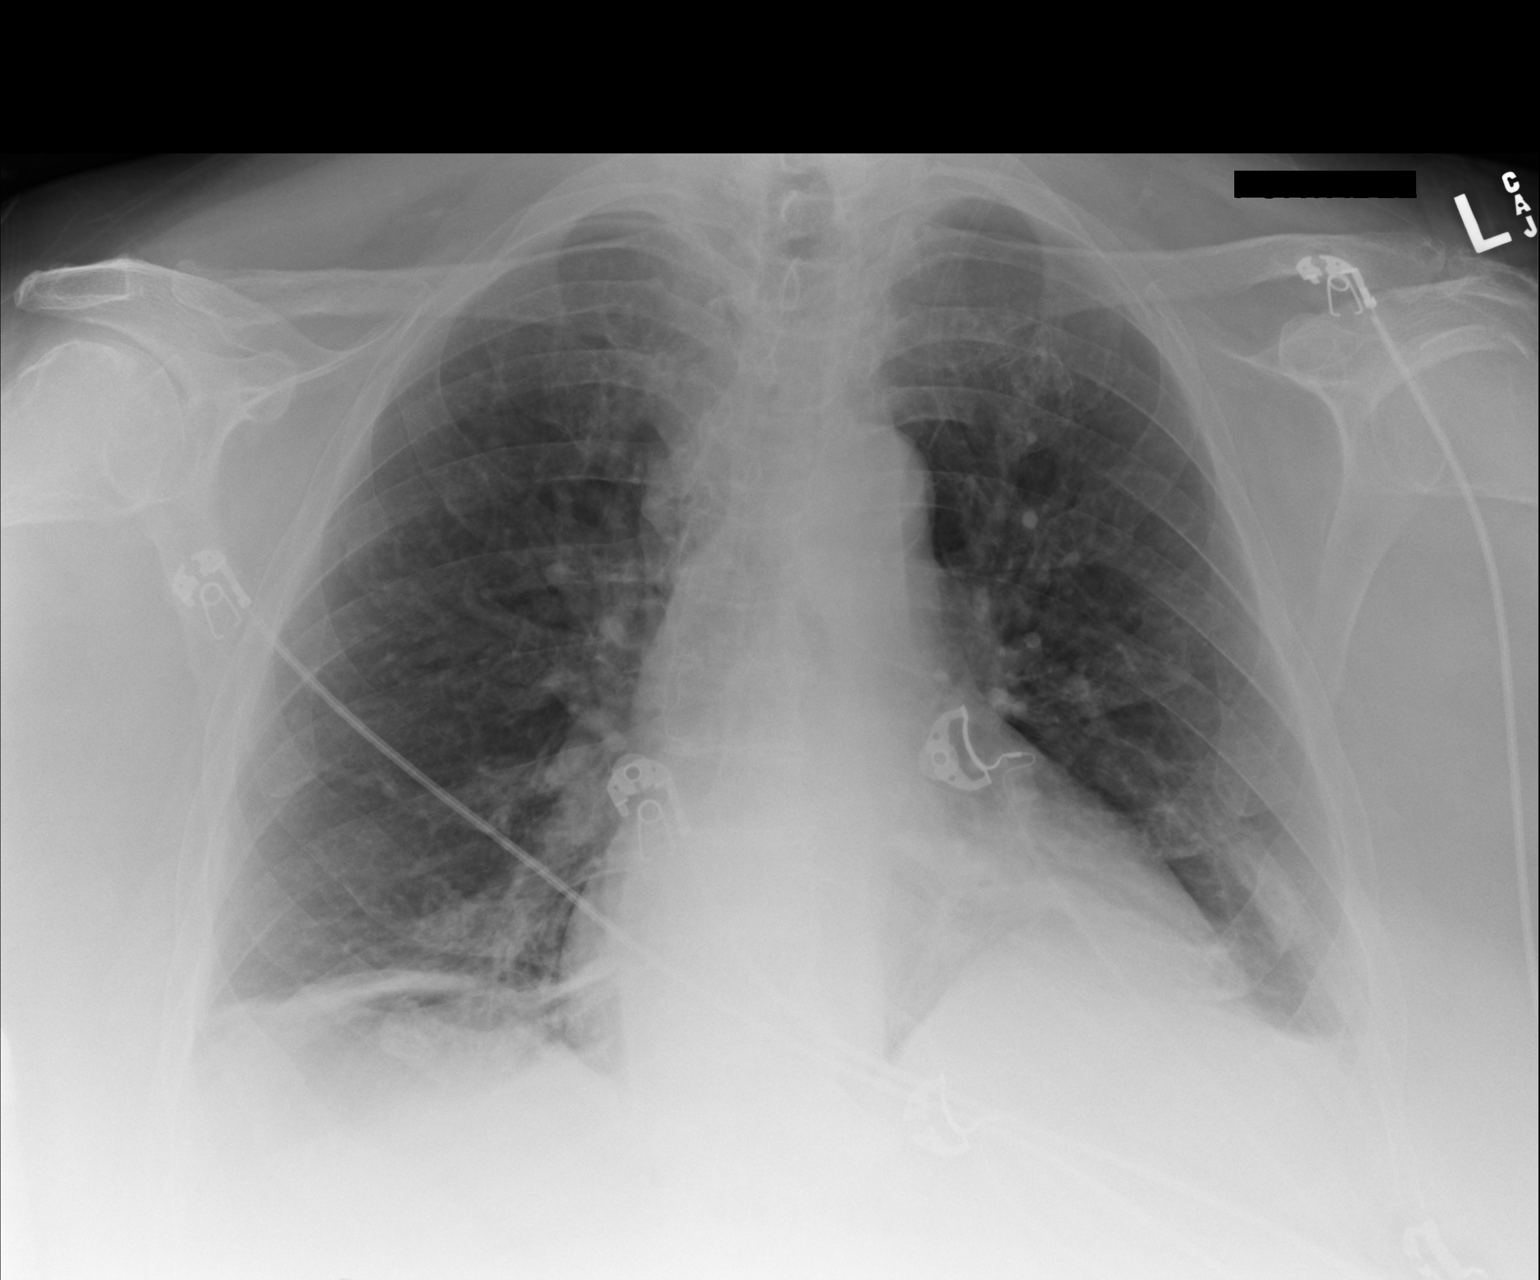

[1 of 1 positions shown; findings below may reference images not displayed]

FINDINGS: The heart size is normal. Linear airspace opacities are present both
bases. There is mild pulmonary vascular congestion without frank
edema. Small effusions are likely present. The visualized soft
tissues and bony thorax are unremarkable.
IMPRESSION: 1. Cardiomegaly and mild pulmonary vascular congestion.
2. Bibasilar airspace disease has increased. While this may
represent atelectasis, infection is not excluded.

## 2020-02-22 IMAGING — CR CHEST - 2 VIEW
2 series · 2 of 2 positions shown · non-contrast
Comparison: 07/05/2018

CLINICAL DATA: Cardiac device in situ.

EXAM:
CHEST - 2 VIEW

[chest pa]
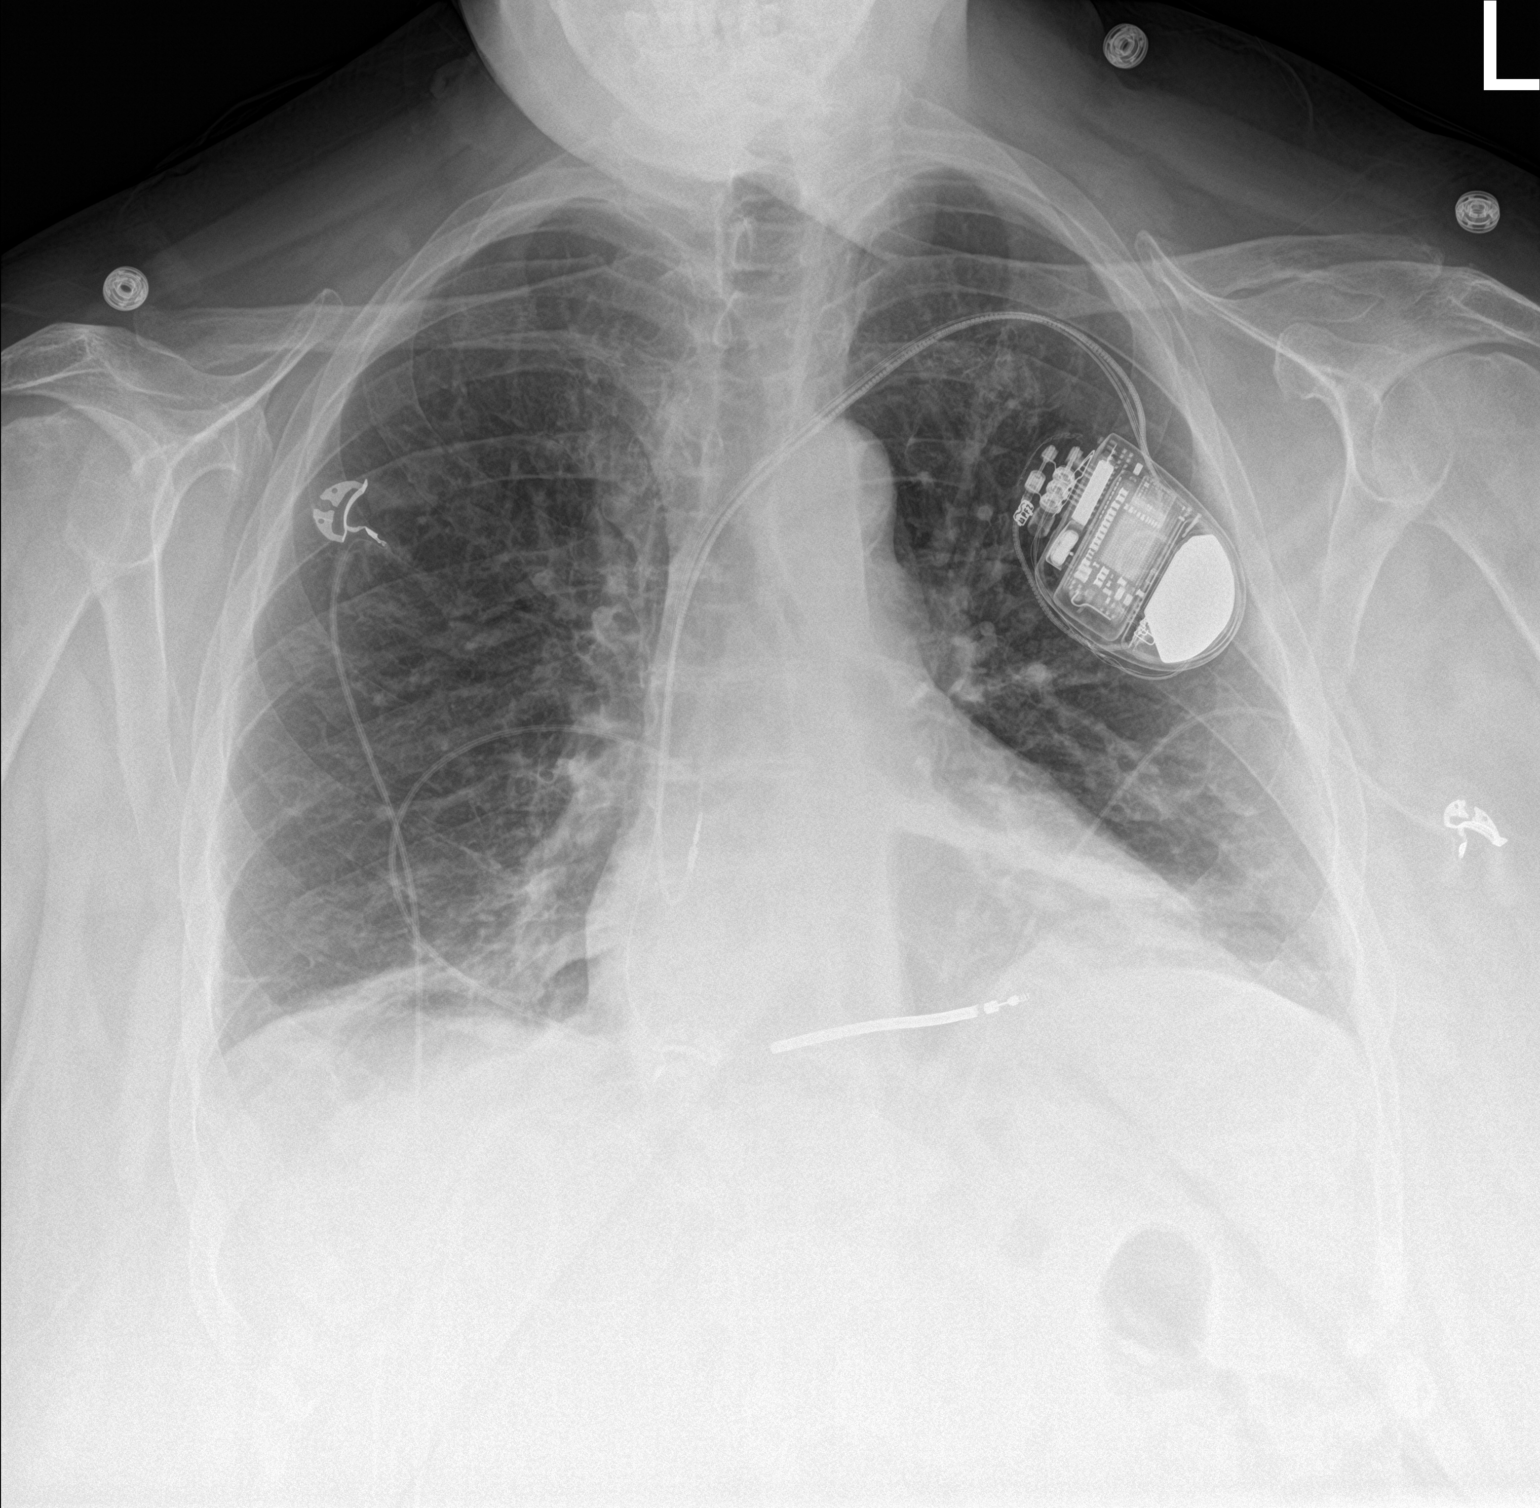

[chest lat]
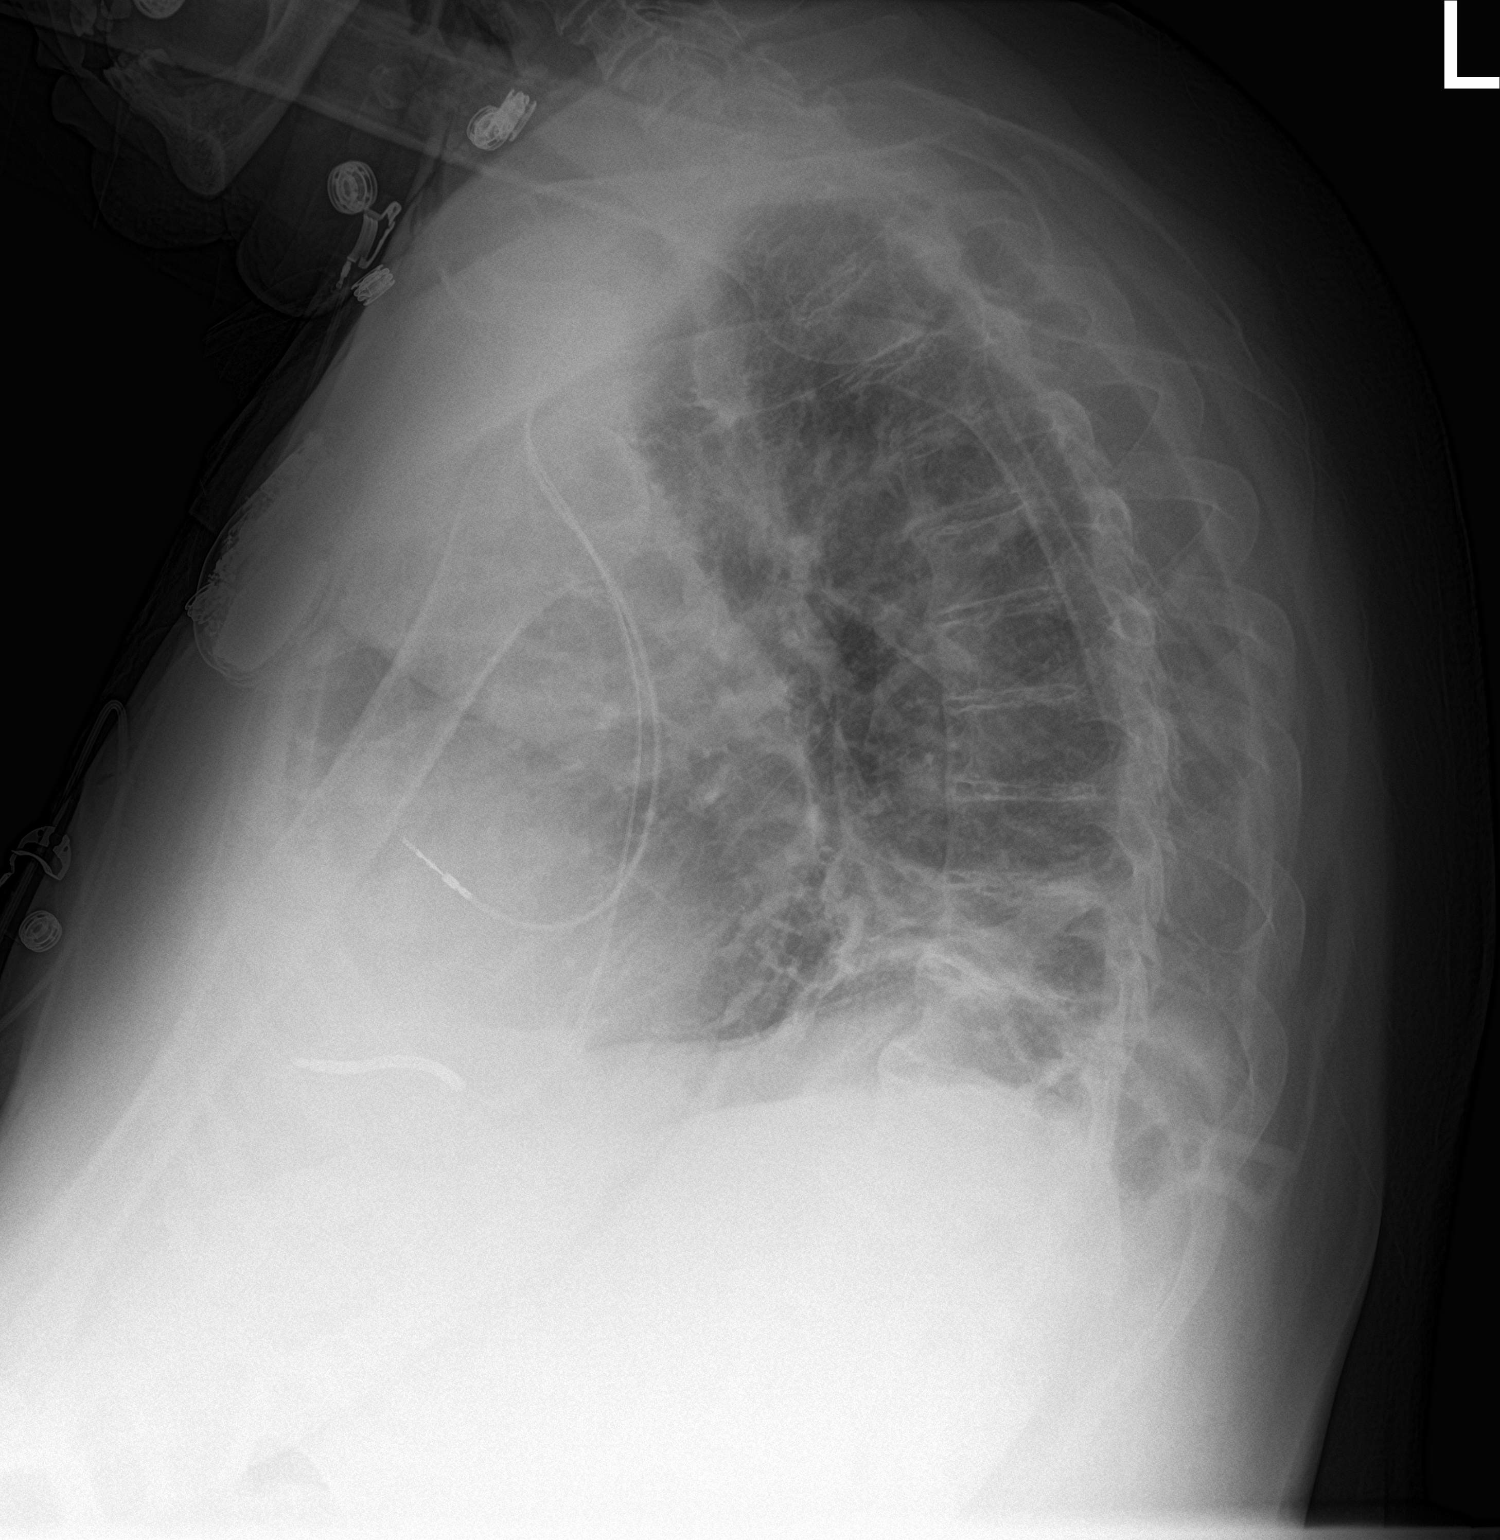

[2 of 2 positions shown; findings below may reference images not displayed]

FINDINGS: A 2 lead AICD device is been placed in the interval. Leads terminate
over the right atrial appendage and right ventricle, in good
position. No pneumothorax. Bibasilar opacities are platelike and
stable. No other interval changes.
IMPRESSION: 1. An AICD device is been placed as above. Leads appear to be in
good position. No pneumothorax.
2. Bibasilar opacities are platelike and stable. Atelectasis is
favored but infection is not completely excluded.

## 2020-02-28 IMAGING — CR CHEST - 2 VIEW
2 series · 2 of 2 positions shown · non-contrast
Comparison: Radiograph 11/06/2018

CLINICAL DATA: Chest pain. Pain around ICD.

EXAM:
CHEST - 2 VIEW

[w chest pa]
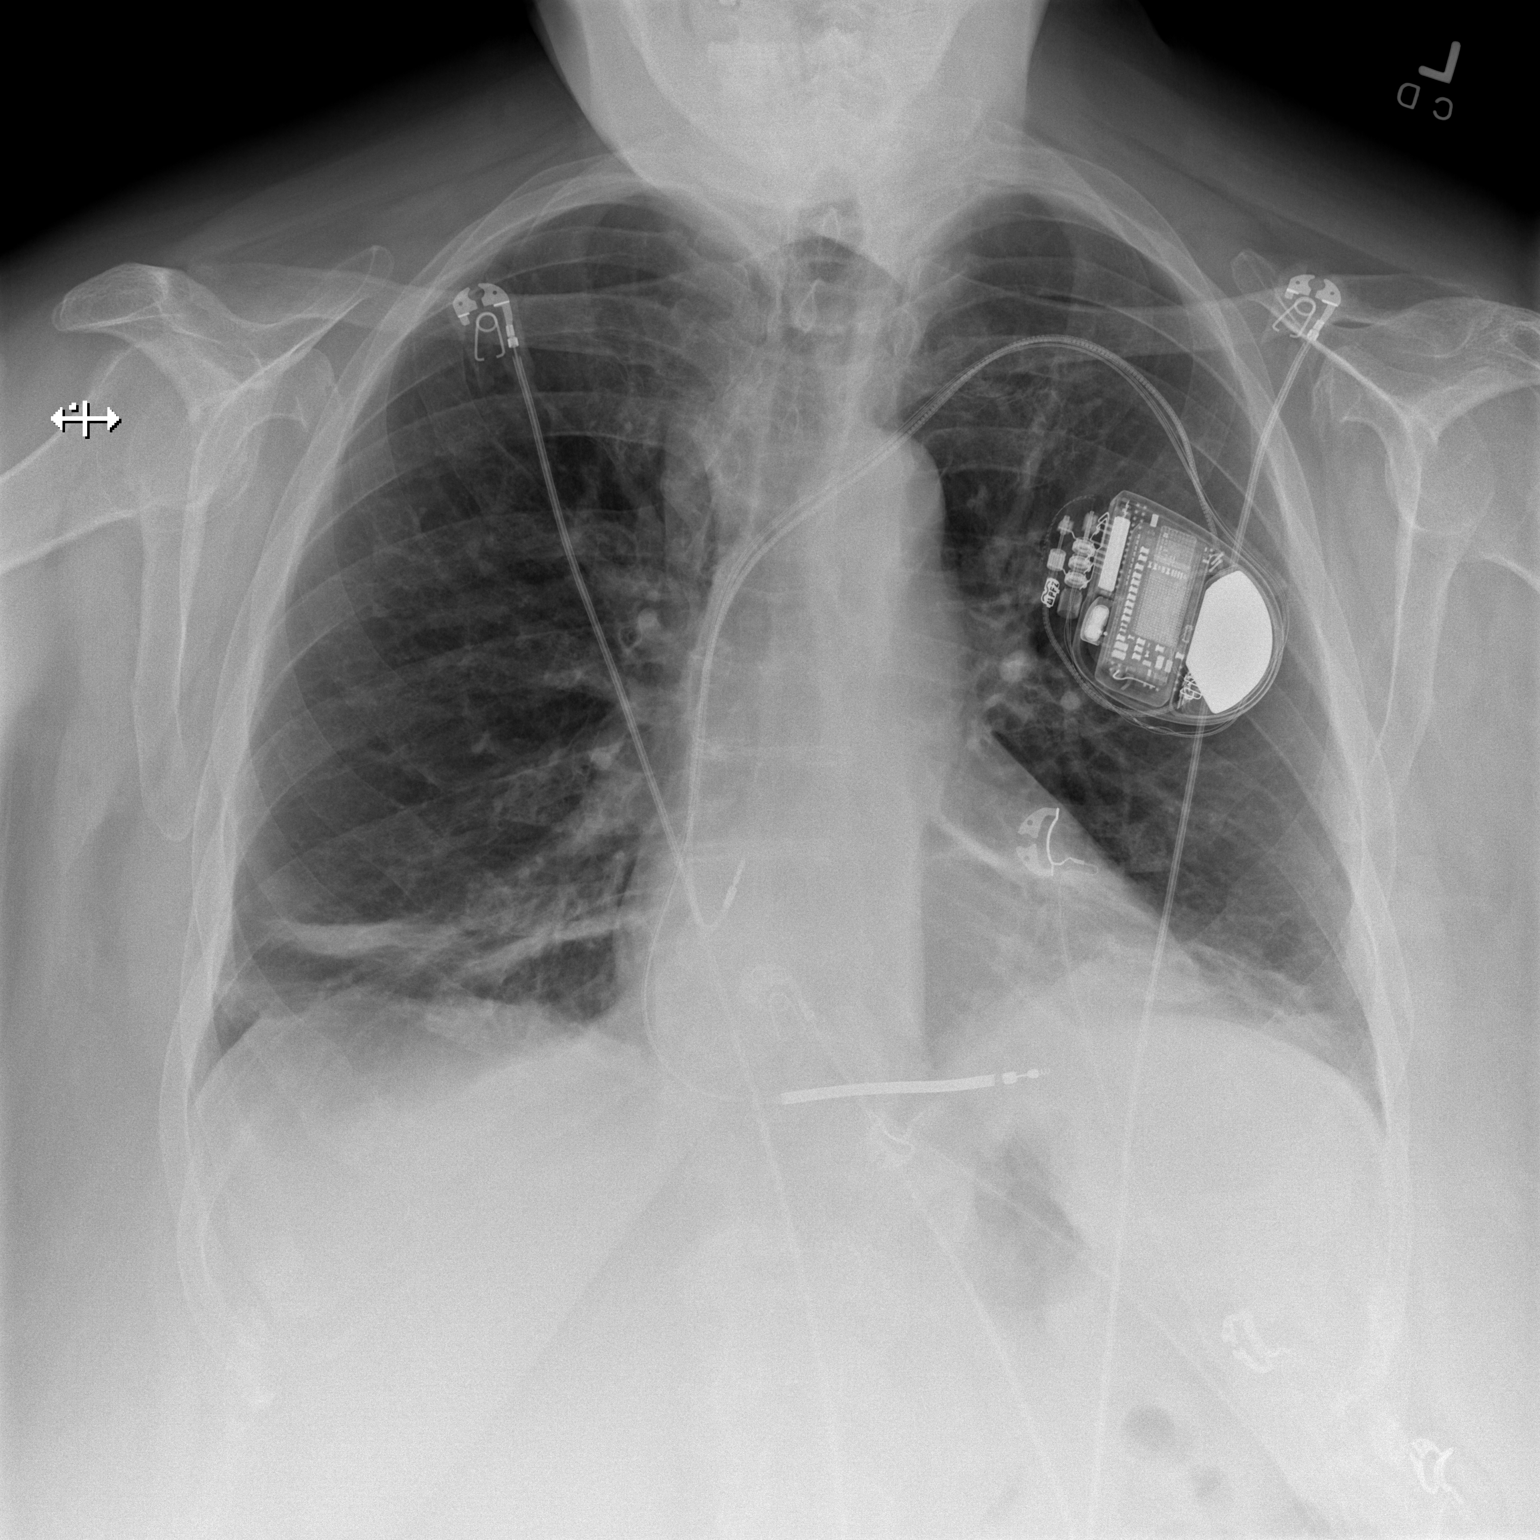

[w chest lat]
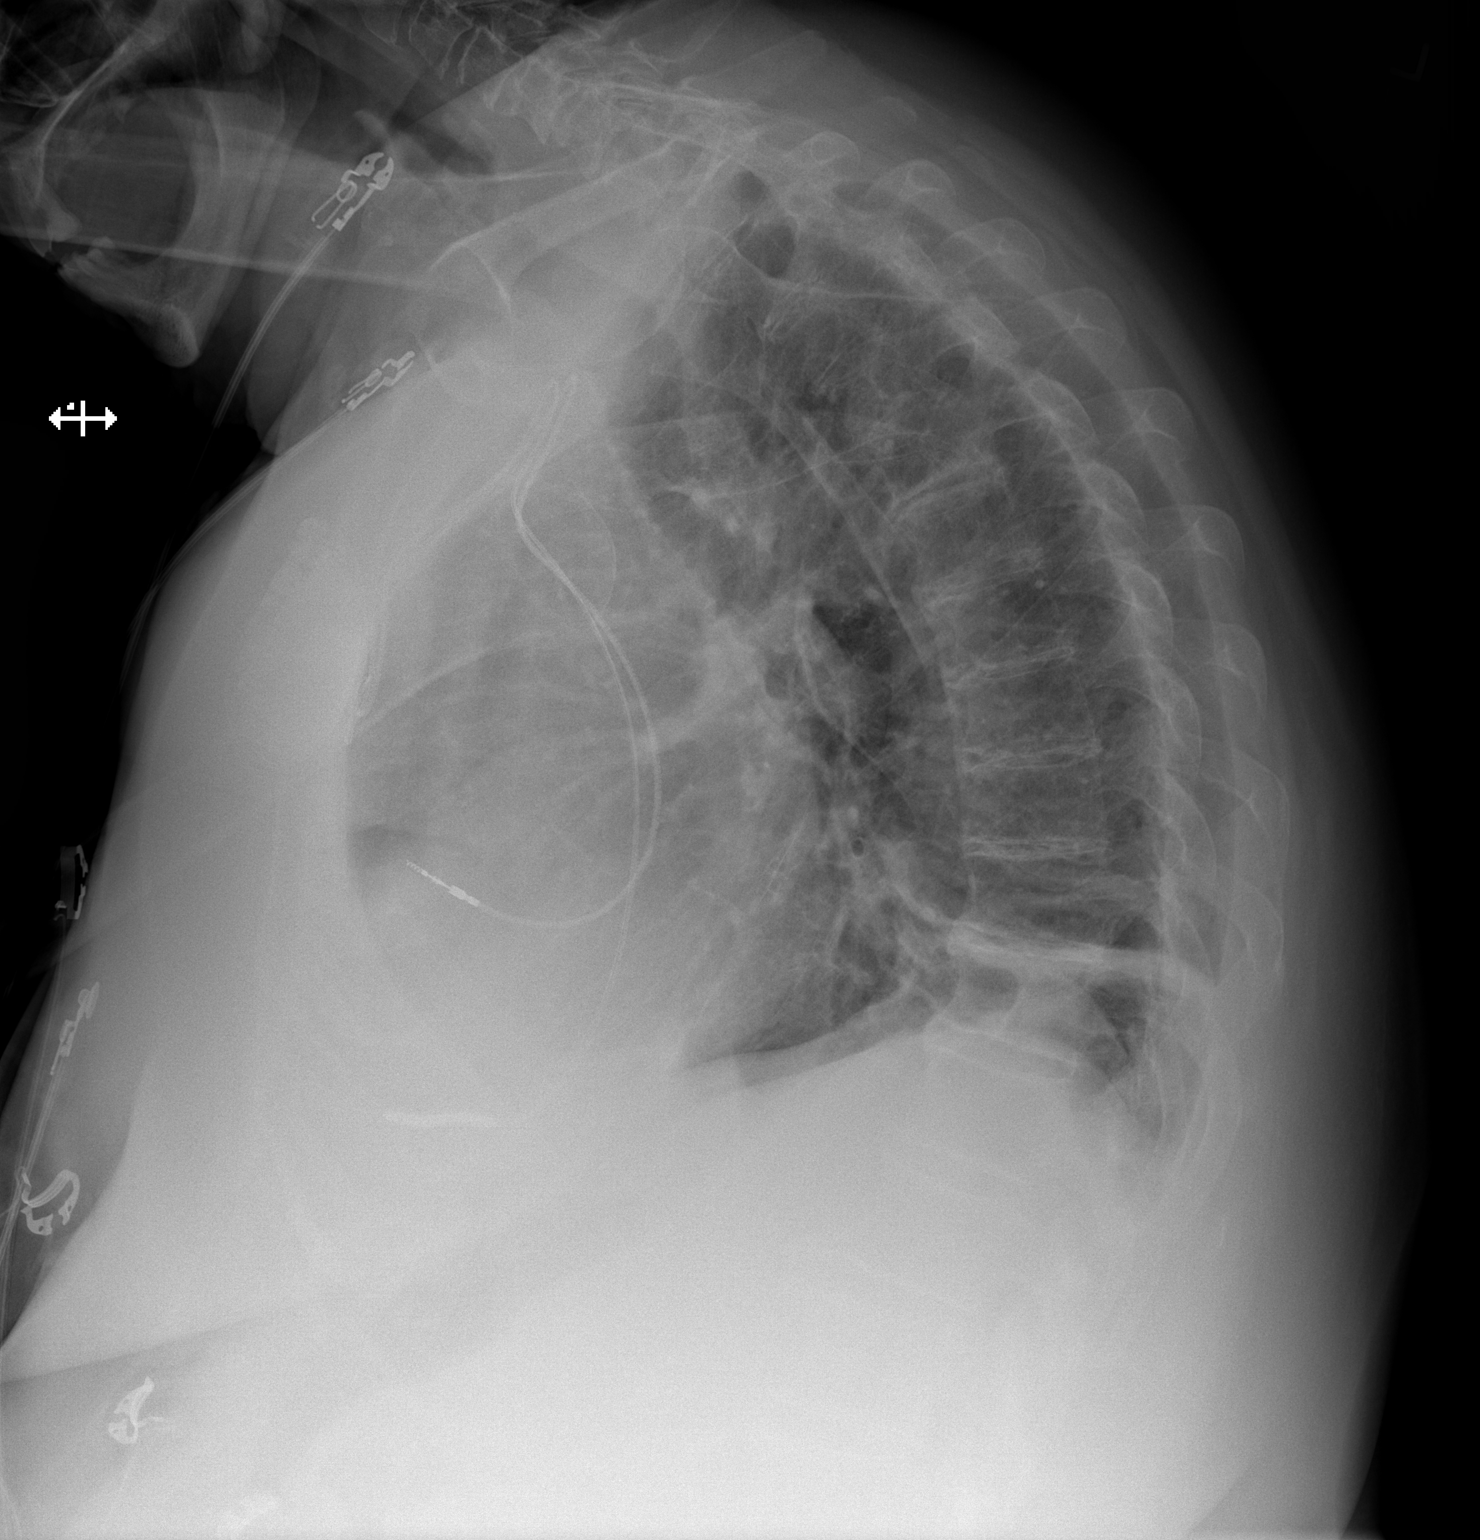

[2 of 2 positions shown; findings below may reference images not displayed]

FINDINGS: Dual lead left-sided pacemaker in place with intact leads. Pacemaker
is unchanged in position. Unchanged heart size and mediastinal
contours. No pneumothorax. Increasing streaky bibasilar atelectasis.
No pulmonary edema or pleural fluid. No acute osseous abnormalities.
IMPRESSION: 1. Dual lead left-sided pacemaker unchanged in position with intact
leads.
2. Increased bibasilar atelectasis.

## 2020-03-01 ENCOUNTER — Telehealth (HOSPITAL_COMMUNITY): Payer: Self-pay | Admitting: *Deleted

## 2020-03-01 NOTE — Telephone Encounter (Signed)
Pt left VM wanting to know from a cardiac standpoint if it was ok for her to fly on an airplane.  Routed to [name],NP-c for advice

## 2020-03-02 NOTE — Telephone Encounter (Signed)
Left detailed vm °

## 2020-03-02 NOTE — Telephone Encounter (Signed)
Ok to fly. Needs to take all medications as directed.   Kambra Beachem NP-C  12:58 PM

## 2020-03-03 ENCOUNTER — Ambulatory Visit (HOSPITAL_COMMUNITY): Admission: RE | Admit: 2020-03-03 | Payer: Medicaid Other | Source: Ambulatory Visit

## 2020-03-03 ENCOUNTER — Ambulatory Visit (HOSPITAL_COMMUNITY): Payer: Medicaid Other | Attending: Cardiology | Admitting: Cardiology

## 2020-10-15 ENCOUNTER — Other Ambulatory Visit (HOSPITAL_COMMUNITY): Payer: Self-pay | Admitting: Adult Health

## 2021-01-31 ENCOUNTER — Other Ambulatory Visit (HOSPITAL_COMMUNITY): Payer: Self-pay | Admitting: Cardiology

## 2021-05-01 ENCOUNTER — Other Ambulatory Visit (HOSPITAL_COMMUNITY): Payer: Self-pay | Admitting: Cardiology

## 2021-05-12 ENCOUNTER — Other Ambulatory Visit (HOSPITAL_COMMUNITY): Payer: Self-pay | Admitting: Cardiology

## 2021-05-16 ENCOUNTER — Other Ambulatory Visit (HOSPITAL_COMMUNITY): Payer: Self-pay | Admitting: Cardiology

## 2021-05-23 ENCOUNTER — Other Ambulatory Visit (HOSPITAL_COMMUNITY): Payer: Self-pay | Admitting: Cardiology

## 2021-06-10 ENCOUNTER — Other Ambulatory Visit (HOSPITAL_COMMUNITY): Payer: Self-pay | Admitting: Cardiology
